# Patient Record
Sex: Male | Born: 1940
Health system: Southern US, Community
[De-identification: ages and names within clinical notes are randomized; demographics above are authoritative.]

## PROBLEM LIST (undated history)

## (undated) DIAGNOSIS — F419 Anxiety disorder, unspecified: Secondary | ICD-10-CM

## (undated) DIAGNOSIS — L570 Actinic keratosis: Secondary | ICD-10-CM

## (undated) DIAGNOSIS — K219 Gastro-esophageal reflux disease without esophagitis: Secondary | ICD-10-CM

## (undated) DIAGNOSIS — R42 Dizziness and giddiness: Secondary | ICD-10-CM

## (undated) HISTORY — DX: Gastro-esophageal reflux disease without esophagitis: K21.9

---
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
## 2006-08-09 ENCOUNTER — Ambulatory Visit: Payer: Self-pay | Admitting: General Surgery

## 2006-09-17 ENCOUNTER — Ambulatory Visit: Payer: Self-pay

## 2007-03-01 DIAGNOSIS — L720 Epidermal cyst: Secondary | ICD-10-CM | POA: Insufficient documentation

## 2007-03-01 DIAGNOSIS — N529 Male erectile dysfunction, unspecified: Secondary | ICD-10-CM | POA: Insufficient documentation

## 2007-06-17 DIAGNOSIS — J309 Allergic rhinitis, unspecified: Secondary | ICD-10-CM | POA: Insufficient documentation

## 2007-07-26 DIAGNOSIS — L570 Actinic keratosis: Secondary | ICD-10-CM

## 2007-07-26 HISTORY — DX: Actinic keratosis: L57.0

## 2008-10-29 DIAGNOSIS — N509 Disorder of male genital organs, unspecified: Secondary | ICD-10-CM | POA: Insufficient documentation

## 2009-02-09 HISTORY — PX: SHOULDER OPEN ROTATOR CUFF REPAIR: SHX2407

## 2009-04-11 DIAGNOSIS — L080 Pyoderma: Secondary | ICD-10-CM | POA: Insufficient documentation

## 2009-04-16 DIAGNOSIS — L98499 Non-pressure chronic ulcer of skin of other sites with unspecified severity: Secondary | ICD-10-CM | POA: Insufficient documentation

## 2009-09-06 DIAGNOSIS — I8390 Asymptomatic varicose veins of unspecified lower extremity: Secondary | ICD-10-CM | POA: Insufficient documentation

## 2009-11-10 ENCOUNTER — Ambulatory Visit: Payer: Self-pay | Admitting: Specialist

## 2009-12-20 ENCOUNTER — Ambulatory Visit: Payer: Self-pay | Admitting: Specialist

## 2010-01-01 ENCOUNTER — Ambulatory Visit: Payer: Self-pay | Admitting: Specialist

## 2010-06-11 ENCOUNTER — Ambulatory Visit: Payer: Self-pay | Admitting: Family Medicine

## 2010-10-05 ENCOUNTER — Ambulatory Visit: Payer: Self-pay | Admitting: Specialist

## 2010-11-04 ENCOUNTER — Observation Stay: Payer: Self-pay | Admitting: Internal Medicine

## 2012-01-23 ENCOUNTER — Ambulatory Visit: Payer: Self-pay | Admitting: Unknown Physician Specialty

## 2012-06-20 ENCOUNTER — Ambulatory Visit: Payer: Self-pay | Admitting: General Surgery

## 2012-06-28 ENCOUNTER — Encounter: Payer: Self-pay | Admitting: General Surgery

## 2012-06-28 ENCOUNTER — Ambulatory Visit (INDEPENDENT_AMBULATORY_CARE_PROVIDER_SITE_OTHER): Payer: Medicare Other | Admitting: General Surgery

## 2012-06-28 VITALS — BP 128/72 | HR 72 | Resp 14 | Ht 71.0 in | Wt 194.0 lb

## 2012-06-28 DIAGNOSIS — I83893 Varicose veins of bilateral lower extremities with other complications: Secondary | ICD-10-CM

## 2012-06-28 NOTE — Patient Instructions (Addendum)
The patient is aware to call back for any questions or concerns. Patient advised to wear compression hose on a daily basis. Patient to continue Ibuprofen for discomfort. Patient to return in 1 month.

## 2012-06-28 NOTE — Progress Notes (Signed)
Patient ID: Edward Rodriguez, male   DOB: 12-Jun-1940, 72 y.o.   MRN: [id number]  Chief Complaint  Patient presents with  . Other    evaluate legs    HPI Edward Rodriguez is a 72 y.o. male.  Patient here today for evaluation of right leg vein.  States he has had pain in that are on/off for about a year but 1-2 months it seems to be worse.  Sates it is "knotty and sensitive". Denies any previous vein surgery.  Recovering from bronchitis last month. He reports that in last 2 mos he has noted some prominent veins in right calf and lower thigh. No symptoms of leg fatigue,aching or swelling at day's end. HPI  Past Medical History  Diagnosis Date  . GERD (gastroesophageal reflux disease)     Past Surgical History  Procedure Laterality Date  . Joint replacement Right 10/2010    rotator cuff    History reviewed. No pertinent family history.  Social History History  Substance Use Topics  . Smoking status: Former Games developer  . Smokeless tobacco: Not on file  . Alcohol Use: Yes     Comment: occasionally    Allergies  Allergen Reactions  . Sulfur Rash    Current Outpatient Prescriptions  Medication Sig Dispense Refill  . ibuprofen (ADVIL,MOTRIN) 200 MG tablet Take 200 mg by mouth every 6 (six) hours as needed for pain.      Marland Kitchen omeprazole (PRILOSEC) 20 MG capsule Take 40 mg by mouth daily.      . ondansetron (ZOFRAN-ODT) 8 MG disintegrating tablet        No current facility-administered medications for this visit.    Review of Systems Review of Systems  Constitutional: Negative.   Respiratory: Negative.   Cardiovascular: Negative.     Blood pressure 128/72, pulse 72, resp. rate 14, height 5\' 11"  (1.803 m), weight 194 lb (87.998 kg).  Physical Exam Physical Exam  Constitutional: He appears well-developed and well-nourished.  Cardiovascular: Normal rate, regular rhythm and normal heart sounds.   Pulses:      Dorsalis pedis pulses are 2+ on the right side, and 2+ on the left side.   Posterior tibial pulses are 2+ on the right side, and 2+ on the left side.  No edema on the legs. Clusters of varicose veins in right medial calf and lower inner thigh. Left side shows no varicose veins. Scattered spider veins in both sides. The cluster in right inner thigh has a few palpable clots.     Data Reviewed None  Assessment    Varicose veins right lower extremity with focal superficial phlebitis lower inner thigh.     Plan    Use of ibuprofen which he is already doing and the use of compression hose for which a RX was given ankle pressure 20-30 mmhg.        SANKAR,SEEPLAPUTHUR G 06/29/2012, 5:17 PM

## 2012-06-29 ENCOUNTER — Encounter: Payer: Self-pay | Admitting: General Surgery

## 2012-06-29 DIAGNOSIS — I83893 Varicose veins of bilateral lower extremities with other complications: Secondary | ICD-10-CM | POA: Insufficient documentation

## 2012-08-01 ENCOUNTER — Ambulatory Visit: Payer: Medicare Other | Admitting: General Surgery

## 2012-08-30 ENCOUNTER — Encounter: Payer: Self-pay | Admitting: *Deleted

## 2013-01-04 ENCOUNTER — Ambulatory Visit: Payer: Medicare Other | Admitting: Podiatry

## 2013-01-04 ENCOUNTER — Encounter: Payer: Self-pay | Admitting: *Deleted

## 2013-01-09 ENCOUNTER — Ambulatory Visit: Payer: Medicare Other | Admitting: Podiatry

## 2013-01-25 ENCOUNTER — Ambulatory Visit (INDEPENDENT_AMBULATORY_CARE_PROVIDER_SITE_OTHER): Payer: Medicare Other | Admitting: Podiatry

## 2013-01-25 ENCOUNTER — Ambulatory Visit (INDEPENDENT_AMBULATORY_CARE_PROVIDER_SITE_OTHER): Payer: Medicare Other

## 2013-01-25 ENCOUNTER — Encounter: Payer: Self-pay | Admitting: Podiatry

## 2013-01-25 VITALS — BP 114/68 | HR 80 | Resp 16 | Ht 70.0 in | Wt 198.4 lb

## 2013-01-25 DIAGNOSIS — M7750 Other enthesopathy of unspecified foot: Secondary | ICD-10-CM

## 2013-01-25 DIAGNOSIS — M79609 Pain in unspecified limb: Secondary | ICD-10-CM

## 2013-01-25 DIAGNOSIS — M79672 Pain in left foot: Secondary | ICD-10-CM

## 2013-01-25 DIAGNOSIS — M7672 Peroneal tendinitis, left leg: Secondary | ICD-10-CM

## 2013-01-25 MED ORDER — METHYLPREDNISOLONE (PAK) 4 MG PO TABS: ORAL_TABLET | ORAL | Status: DC

## 2013-01-25 NOTE — Progress Notes (Signed)
Edward Rodriguez presents today as a 72 year old white male with pain to his left foot lateral aspect times past 6 months to one year. His and nothing to try to assist in relieving the pain.  Objective: Vital signs are stable he is alert and oriented x3. Pulses remain strongly palpable bilateral lower extremity. He has tenderness on abduction against resistance he also has tenderness on plantar flexion and eversion of his left foot. Tenderness on direct palpation of the fifth metatarsal base. Radiographs evaluation does not demonstrate any type of osseous abnormalities in this area however I am concerned of insertional peroneal tendinitis.  Assessment: Peroneal tendinitis left.  Plan: Injected at the point of maximum tenderness today 2 mg of dexamethasone and local anesthetic. Her prescription for Medrol Dosepak. And I will followup with him in one month.

## 2013-02-22 ENCOUNTER — Ambulatory Visit: Payer: Medicare Other | Admitting: Podiatry

## 2014-05-31 ENCOUNTER — Ambulatory Visit
Admit: 2014-05-31 | Disposition: A | Discharge status: Home / Self Care | Payer: Self-pay | Attending: Specialist | Admitting: Specialist

## 2014-06-10 DIAGNOSIS — R42 Dizziness and giddiness: Secondary | ICD-10-CM

## 2014-06-10 HISTORY — DX: Dizziness and giddiness: R42

## 2014-06-10 HISTORY — PX: ROTATOR CUFF REPAIR: SHX139

## 2014-06-19 ENCOUNTER — Encounter
Admission: RE | Admit: 2014-06-19 | Discharge: 2014-06-19 | Disposition: A | Discharge status: Home / Self Care | Payer: Commercial Managed Care - HMO | Source: Ambulatory Visit | Attending: Orthopedic Surgery | Admitting: Orthopedic Surgery

## 2014-06-19 DIAGNOSIS — F419 Anxiety disorder, unspecified: Secondary | ICD-10-CM | POA: Diagnosis not present

## 2014-06-19 DIAGNOSIS — R42 Dizziness and giddiness: Secondary | ICD-10-CM | POA: Diagnosis not present

## 2014-06-19 DIAGNOSIS — Z01810 Encounter for preprocedural cardiovascular examination: Secondary | ICD-10-CM | POA: Diagnosis not present

## 2014-06-19 DIAGNOSIS — Z01812 Encounter for preprocedural laboratory examination: Secondary | ICD-10-CM | POA: Diagnosis present

## 2014-06-19 DIAGNOSIS — I83899 Varicose veins of unspecified lower extremities with other complications: Secondary | ICD-10-CM | POA: Insufficient documentation

## 2014-06-19 DIAGNOSIS — K219 Gastro-esophageal reflux disease without esophagitis: Secondary | ICD-10-CM | POA: Insufficient documentation

## 2014-06-19 HISTORY — DX: Dizziness and giddiness: R42

## 2014-06-19 HISTORY — DX: Anxiety disorder, unspecified: F41.9

## 2014-06-19 LAB — BASIC METABOLIC PANEL
Anion gap: 5 (ref 5–15)
BUN: 16 mg/dL (ref 6–20)
CALCIUM: 9.5 mg/dL (ref 8.9–10.3)
CO2: 32 mmol/L (ref 22–32)
CREATININE: 1.09 mg/dL (ref 0.61–1.24)
Chloride: 105 mmol/L (ref 101–111)
GFR calc non Af Amer: >60 mL/min (ref >60)
GLUCOSE: 103 mg/dL — AB (ref 65–99)
Potassium: 4.2 mmol/L (ref 3.5–5.1)
Sodium: 142 mmol/L (ref 135–145)

## 2014-06-19 LAB — CBC
HCT: 45.5 % (ref 40.0–52.0)
Hemoglobin: 15.7 g/dL (ref 13.0–18.0)
MCH: 32.1 pg (ref 26.0–34.0)
MCHC: 34.4 g/dL (ref 32.0–36.0)
MCV: 93.3 fL (ref 80.0–100.0)
PLATELETS: 215 10*3/uL (ref 150–440)
RBC: 4.88 MIL/uL (ref 4.40–5.90)
RDW: 13.5 % (ref 11.5–14.5)
WBC: 7.7 10*3/uL (ref 3.8–10.6)

## 2014-06-19 LAB — APTT: aPTT: 28 seconds (ref 24–36)

## 2014-06-19 LAB — PROTIME-INR
INR: 0.89
PROTHROMBIN TIME: 12.3 s (ref 11.4–15.0)

## 2014-06-19 NOTE — Patient Instructions (Signed)
  Your procedure is scheduled on: Wednesday Jun 27, 2014 Report to Same Day Surgery. To find out your arrival time please call [phone] between 1PM - 3PM on Wednesday Jun 26, 2014.  Remember: Instructions that are not followed completely may result in serious medical risk, up to and including death, or upon the discretion of your surgeon and anesthesiologist your surgery may need to be rescheduled.    __x__ 1. Do not eat food or drink liquids after midnight. No gum chewing or hard candies.     __x__ 2. No Alcohol for 24 hours before or after surgery.   ____ 3. Bring all medications with you on the day of surgery if instructed.    _x_ 4. Notify your doctor if there is any change in your medical condition     (cold, fever, infections).     Do not wear jewelry, make-up, hairpins, clips or nail polish.  Do not wear lotions, powders, or perfumes. You may wear deodorant.  Do not shave 48 hours prior to surgery. Men may shave face and neck.  Do not bring valuables to the hospital.    [facility] is not responsible for any belongings or valuables.               Contacts, dentures or bridgework may not be worn into surgery.  Leave your suitcase in the car. After surgery it may be brought to your room.  For patients admitted to the hospital, discharge time is determined by your treatment team.   Patients discharged the day of surgery will not be allowed to drive home.   Please read over the following fact sheets that you were given:   [facility] Preparing for Surgery   ____ Take these medicines the morning of surgery with A SIP OF WATER:      1. Omeprazole (PRILOSEC)  2. venlafaxine XR (EFFEXOR-XR)  3. HYDROcodone-acetaminophen OPTIONAL  4. meclizine (ANTIVERT) optional if needed   ____ Fleet Enema (as directed)   __x__ Use CHG Soap as directed  ____ Use inhalers on the day of surgery  ____ Stop metformin 2 days prior to surgery    ____ Take 1/2 of usual insulin dose the  night before surgery and none on the morning of surgery.   ____ Stop Coumadin/Plavix/aspirin on does not apply.  __x__ Stop Anti-inflammatories (Ibuprofen)on already stopped.    ____ Stop supplements until after surgery.    ____ Bring C-Pap to the hospital.

## 2014-06-26 NOTE — H&P (Signed)
PREOPERATIVE H&P  Chief Complaint: FULL THICKNESS ROTATOR CUFF TEAR, LEFT SHOULDER  HPI: Edward Rodriguez is a 74 y.o. male who presents for preoperative history and physical with a diagnosis of FULL THICKNESS ROTATOR CUFF TEAR of his left shoulder. He has pain with abduction and external rotation activity.  His symptoms have persisted since last summer and have not improved despite non-operative management including NSAIDS, PT and corticosteroid injections.  An MRI has demonstrated a full thickness tear of the infraspinatus.   He has elected for surgical management of this injury given the failure of non-operative management..   Past Medical History  Diagnosis Date  . GERD (gastroesophageal reflux disease)   . Vertigo 06/2014    had an episode in 2013.  Marland Kitchen Anxiety    Past Surgical History  Procedure Laterality Date  . Shoulder open rotator cuff repair Right 2011   History   Social History  . Marital Status: Married    Spouse Name: N/A  . Number of Children: N/A  . Years of Education: N/A   Social History Main Topics  . Smoking status: Former Smoker    Quit date: 06/18/1968  . Smokeless tobacco: Never Used  . Alcohol Use: 2.4 - 3.0 oz/week    4 Cans of beer, 0-1 Shots of liquor per week     Comment: occasionally  . Drug Use: No  . Sexual Activity: Not on file   Other Topics Concern  . Not on file   Social History Narrative   No family history on file. Allergies  Allergen Reactions  . Sulfur Rash   Prior to Admission medications   Medication Sig Start Date End Date Taking? Authorizing Provider  fluticasone (FLONASE) 50 MCG/ACT nasal spray Place 2 sprays into the nose every morning.  04/05/13   Historical Provider, MD  HYDROcodone-acetaminophen (NORCO/VICODIN) 5-325 MG per tablet Take 1 tablet by mouth every 6 (six) hours as needed for moderate pain.    Historical Provider, MD  Hydrocortisone Ace-Pramoxine 2.5-1 % LOTN Place 1 Tube rectally 3 (three) times daily as needed  (hemorrhoids). Apply 2-3 times a day 07/13/13   Historical Provider, MD  ibuprofen (ADVIL,MOTRIN) 200 MG tablet Take 200 mg by mouth every 6 (six) hours as needed for pain.    Historical Provider, MD  meclizine (ANTIVERT) 25 MG tablet Take 25 mg by mouth 3 (three) times daily as needed for dizziness.    Historical Provider, MD  methylPREDNIsolone (MEDROL DOSPACK) 4 MG tablet follow package directions 01/25/13   Max T Hyatt, DPM  omeprazole (PRILOSEC) 20 MG capsule Take 40 mg by mouth every morning.     Historical Provider, MD  venlafaxine XR (EFFEXOR-XR) 150 MG 24 hr capsule Take 150 mg by mouth every morning.    Historical Provider, MD     Positive ROS: All other systems have been reviewed and were otherwise negative with the exception of those mentioned in the HPI and as above.  Physical Exam: General: Alert, no acute distress HEENT:  PERRLA, EOMI, No oral lesions, 2+ carotid pulses and no cervical lymphadenopathy Cardiac:  RRR, No M/R/G Respiratory: CTA bilaterally, No wheezing GI: No organomegaly, abdomen is soft and non-tender, non distended, + bowel sounds Skin: No lesions in the area of chief complaint Neurologic: Sensation intact distally Psychiatric: Patient is competent for consent with normal mood and affect   MUSCULOSKELETAL: Left shoulder:  Patient has 160 forward elevation and abduction.  He can ER to 60 degrees but has significant weakness of his  external rotators.  He has mild pain with resisted abduction.  Skin is intact.  He has intact sensation to light touch and a palpable radial pulse.  He has no instability or apprehension.  He has tenderness over the greater tuberosity.  Assessment: FULL THICKNESS ROTATOR CUFF TEAR, LEFT SHOULDER  Plan: Plan for Procedure(s): ARTHROSCOPY SHOULDER with mini open rotator cuff repair of the left shoulder  I reviewed the procedure in detail with the patient as well as the post-operative course including use of a sling for 4 weeks and  the need for post-op physical therapy.  The risks benefits and alternatives were discussed with the patient.  The risks include but are not not limited to infection, bleeding, nerve or blood vessel  injury, shoulder stiffness, persistent pain or weakness, hardware failure, retear of the rotator cuff and the need for further surgery.  Medical risks include but are not limited to: DVT/PE,  MI, stroke, pneumonia, respiratory failure and death.   He understood these risks and wished to proceed. He has been cleared for surgery by his PCP, Dr. Lelon Huh.  Thornton Park, MD   06/26/2014 9:15 PM

## 2014-06-27 ENCOUNTER — Ambulatory Visit: Payer: Commercial Managed Care - HMO | Admitting: Anesthesiology

## 2014-06-27 ENCOUNTER — Observation Stay
Admission: RE | Admit: 2014-06-27 | Discharge: 2014-06-28 | Disposition: A | Discharge status: Home / Self Care | Payer: Commercial Managed Care - HMO | Source: Ambulatory Visit | Attending: Orthopedic Surgery | Admitting: Orthopedic Surgery

## 2014-06-27 ENCOUNTER — Encounter
Admission: RE | Disposition: A | Discharge status: Home / Self Care | Payer: Self-pay | Source: Ambulatory Visit | Attending: Orthopedic Surgery

## 2014-06-27 ENCOUNTER — Encounter: Payer: Self-pay | Admitting: Anesthesiology

## 2014-06-27 DIAGNOSIS — Z7951 Long term (current) use of inhaled steroids: Secondary | ICD-10-CM | POA: Diagnosis not present

## 2014-06-27 DIAGNOSIS — M75102 Unspecified rotator cuff tear or rupture of left shoulder, not specified as traumatic: Secondary | ICD-10-CM | POA: Diagnosis present

## 2014-06-27 DIAGNOSIS — Z7952 Long term (current) use of systemic steroids: Secondary | ICD-10-CM | POA: Diagnosis not present

## 2014-06-27 DIAGNOSIS — M25512 Pain in left shoulder: Secondary | ICD-10-CM

## 2014-06-27 DIAGNOSIS — S43422A Sprain of left rotator cuff capsule, initial encounter: Secondary | ICD-10-CM

## 2014-06-27 DIAGNOSIS — Z79891 Long term (current) use of opiate analgesic: Secondary | ICD-10-CM | POA: Insufficient documentation

## 2014-06-27 DIAGNOSIS — Z882 Allergy status to sulfonamides status: Secondary | ICD-10-CM | POA: Diagnosis not present

## 2014-06-27 DIAGNOSIS — Z791 Long term (current) use of non-steroidal anti-inflammatories (NSAID): Secondary | ICD-10-CM | POA: Diagnosis not present

## 2014-06-27 DIAGNOSIS — F419 Anxiety disorder, unspecified: Secondary | ICD-10-CM | POA: Diagnosis not present

## 2014-06-27 DIAGNOSIS — K219 Gastro-esophageal reflux disease without esophagitis: Secondary | ICD-10-CM | POA: Insufficient documentation

## 2014-06-27 DIAGNOSIS — Z79899 Other long term (current) drug therapy: Secondary | ICD-10-CM | POA: Diagnosis not present

## 2014-06-27 DIAGNOSIS — Z87891 Personal history of nicotine dependence: Secondary | ICD-10-CM | POA: Diagnosis not present

## 2014-06-27 DIAGNOSIS — M75112 Incomplete rotator cuff tear or rupture of left shoulder, not specified as traumatic: Secondary | ICD-10-CM | POA: Diagnosis not present

## 2014-06-27 DIAGNOSIS — Z9889 Other specified postprocedural states: Secondary | ICD-10-CM

## 2014-06-27 DIAGNOSIS — M25519 Pain in unspecified shoulder: Secondary | ICD-10-CM

## 2014-06-27 HISTORY — PX: SHOULDER ARTHROSCOPY: SHX128

## 2014-06-27 SURGERY — ARTHROSCOPY, SHOULDER
Anesthesia: General | Laterality: Left | Wound class: Clean

## 2014-06-27 MED ORDER — LACTATED RINGERS IV SOLN
INTRAVENOUS | Status: DC | PRN
  Administered 2014-06-27: 10 mL

## 2014-06-27 MED ORDER — BUPIVACAINE HCL 0.25 % IJ SOLN
INTRAMUSCULAR | Status: DC | PRN
  Administered 2014-06-27: 30 mL

## 2014-06-27 MED ORDER — PHENOL 1.4 % MT LIQD: 1.0000 | OROMUCOSAL | Status: DC | PRN

## 2014-06-27 MED ORDER — ONDANSETRON HCL 4 MG/2ML IJ SOLN: 4.0000 mg | Freq: Four times a day (QID) | INTRAMUSCULAR | Status: DC | PRN

## 2014-06-27 MED ORDER — LIDOCAINE HCL 1 % IJ SOLN
INTRAMUSCULAR | Status: DC | PRN
  Administered 2014-06-27: 12 mL via INTRADERMAL

## 2014-06-27 MED ORDER — MIDAZOLAM HCL 2 MG/2ML IJ SOLN
1.0000 mg | Freq: Once | INTRAMUSCULAR | Status: AC
  Administered 2014-06-27: 1 mg via INTRAVENOUS

## 2014-06-27 MED ORDER — MENTHOL 3 MG MT LOZG
1.0000 | LOZENGE | OROMUCOSAL | Status: DC | PRN
  Filled 2014-06-27: qty 9

## 2014-06-27 MED ORDER — BISACODYL 5 MG PO TBEC: 5.0000 mg | DELAYED_RELEASE_TABLET | Freq: Every day | ORAL | Status: DC | PRN

## 2014-06-27 MED ORDER — MENTHOL 3 MG MT LOZG: 1.0000 | LOZENGE | OROMUCOSAL | Status: DC | PRN

## 2014-06-27 MED ORDER — HYDROMORPHONE HCL 1 MG/ML IJ SOLN: 1.0000 mg | INTRAMUSCULAR | Status: DC | PRN

## 2014-06-27 MED ORDER — MIDAZOLAM HCL 5 MG/5ML IJ SOLN
INTRAMUSCULAR | Status: DC | PRN
  Administered 2014-06-27 (×2): 1 mg via INTRAVENOUS

## 2014-06-27 MED ORDER — PROPOFOL 10 MG/ML IV BOLUS
INTRAVENOUS | Status: DC | PRN
  Administered 2014-06-27: 20 mg via INTRAVENOUS
  Administered 2014-06-27: 150 mg via INTRAVENOUS

## 2014-06-27 MED ORDER — LACTATED RINGERS IV SOLN
INTRAVENOUS | Status: DC
  Administered 2014-06-27 (×3): via INTRAVENOUS

## 2014-06-27 MED ORDER — MAGNESIUM HYDROXIDE 400 MG/5ML PO SUSP: 30.0000 mL | Freq: Every day | ORAL | Status: DC | PRN

## 2014-06-27 MED ORDER — MAGNESIUM CITRATE PO SOLN
1.0000 | Freq: Once | ORAL | Status: AC | PRN
  Filled 2014-06-27: qty 296

## 2014-06-27 MED ORDER — ACETAMINOPHEN 325 MG PO TABS: 650.0000 mg | ORAL_TABLET | Freq: Four times a day (QID) | ORAL | Status: DC | PRN

## 2014-06-27 MED ORDER — LIDOCAINE HCL (CARDIAC) 10 MG/ML IV SOLN
INTRAVENOUS | Status: DC | PRN
  Administered 2014-06-27: 40 mg via INTRAVENOUS

## 2014-06-27 MED ORDER — ALUM & MAG HYDROXIDE-SIMETH 200-200-20 MG/5ML PO SUSP: 30.0000 mL | ORAL | Status: DC | PRN

## 2014-06-27 MED ORDER — FENTANYL CITRATE (PF) 100 MCG/2ML IJ SOLN
50.0000 ug | Freq: Once | INTRAMUSCULAR | Status: AC
  Administered 2014-06-27: 50 ug via INTRAVENOUS

## 2014-06-27 MED ORDER — ONDANSETRON HCL 4 MG PO TABS
4.0000 mg | ORAL_TABLET | Freq: Four times a day (QID) | ORAL | Status: DC | PRN
  Filled 2014-06-27: qty 1

## 2014-06-27 MED ORDER — OXYCODONE HCL 5 MG PO TABS
5.0000 mg | ORAL_TABLET | ORAL | Status: DC | PRN
  Administered 2014-06-27 (×2): 5 mg via ORAL
  Administered 2014-06-28 (×3): 10 mg via ORAL
  Filled 2014-06-27: qty 1
  Filled 2014-06-27: qty 2
  Filled 2014-06-27: qty 1
  Filled 2014-06-27 (×2): qty 2

## 2014-06-27 MED ORDER — OXYCODONE HCL 5 MG PO TABS: 5.0000 mg | ORAL_TABLET | ORAL | Status: DC | PRN

## 2014-06-27 MED ORDER — CELECOXIB 200 MG PO CAPS
200.0000 mg | ORAL_CAPSULE | Freq: Two times a day (BID) | ORAL | Status: DC
  Administered 2014-06-27 – 2014-06-28 (×2): 200 mg via ORAL
  Filled 2014-06-27 (×2): qty 1

## 2014-06-27 MED ORDER — SODIUM CHLORIDE 0.9 % IV SOLN: INTRAVENOUS | Status: DC

## 2014-06-27 MED ORDER — NEOMYCIN-POLYMYXIN B GU 40-200000 IR SOLN
Status: DC | PRN
  Administered 2014-06-27: 2 mL

## 2014-06-27 MED ORDER — DOCUSATE SODIUM 100 MG PO CAPS
100.0000 mg | ORAL_CAPSULE | Freq: Two times a day (BID) | ORAL | Status: DC
  Administered 2014-06-27 – 2014-06-28 (×2): 100 mg via ORAL
  Filled 2014-06-27 (×2): qty 1

## 2014-06-27 MED ORDER — DIPHENHYDRAMINE HCL 12.5 MG/5ML PO ELIX
12.5000 mg | ORAL_SOLUTION | ORAL | Status: DC | PRN
  Filled 2014-06-27: qty 10

## 2014-06-27 MED ORDER — NEOMYCIN-POLYMYXIN B GU 40-200000 IR SOLN
Status: AC
  Filled 2014-06-27: qty 2

## 2014-06-27 MED ORDER — CEFAZOLIN SODIUM-DEXTROSE 2-3 GM-% IV SOLR: 2.0000 g | Freq: Once | INTRAVENOUS | Status: DC

## 2014-06-27 MED ORDER — DIPHENHYDRAMINE HCL 12.5 MG/5ML PO ELIX
12.5000 mg | ORAL_SOLUTION | ORAL | Status: DC | PRN
  Administered 2014-06-27: 25 mg via ORAL
  Filled 2014-06-27: qty 10

## 2014-06-27 MED ORDER — GLYCOPYRROLATE 0.2 MG/ML IJ SOLN
INTRAMUSCULAR | Status: DC | PRN
  Administered 2014-06-27: 0.6 mg via INTRAVENOUS

## 2014-06-27 MED ORDER — FLUTICASONE PROPIONATE 50 MCG/ACT NA SUSP
2.0000 | Freq: Every day | NASAL | Status: DC
Start: 2014-06-27 — End: 2014-06-28
  Administered 2014-06-27 – 2014-06-28 (×2): 2 via NASAL
  Filled 2014-06-27: qty 16

## 2014-06-27 MED ORDER — PHENOL 1.4 % MT LIQD
1.0000 | OROMUCOSAL | Status: DC | PRN
  Filled 2014-06-27: qty 177

## 2014-06-27 MED ORDER — CEFAZOLIN SODIUM-DEXTROSE 2-3 GM-% IV SOLR
INTRAVENOUS | Status: AC
  Administered 2014-06-27: 2 g via INTRAVENOUS
  Filled 2014-06-27: qty 50

## 2014-06-27 MED ORDER — DOCUSATE SODIUM 100 MG PO CAPS: 100.0000 mg | ORAL_CAPSULE | Freq: Two times a day (BID) | ORAL | Status: DC

## 2014-06-27 MED ORDER — ACETAMINOPHEN 650 MG RE SUPP: 650.0000 mg | Freq: Four times a day (QID) | RECTAL | Status: DC | PRN

## 2014-06-27 MED ORDER — ONDANSETRON HCL 4 MG PO TABS: 4.0000 mg | ORAL_TABLET | Freq: Four times a day (QID) | ORAL | Status: DC | PRN

## 2014-06-27 MED ORDER — VENLAFAXINE HCL ER 75 MG PO CP24
150.0000 mg | ORAL_CAPSULE | Freq: Every day | ORAL | Status: DC
  Administered 2014-06-27 – 2014-06-28 (×2): 150 mg via ORAL
  Filled 2014-06-27 (×2): qty 2

## 2014-06-27 MED ORDER — MIDAZOLAM HCL 5 MG/5ML IJ SOLN
INTRAMUSCULAR | Status: AC
  Administered 2014-06-27: 1 mg via INTRAVENOUS
  Filled 2014-06-27: qty 5

## 2014-06-27 MED ORDER — SUCCINYLCHOLINE CHLORIDE 20 MG/ML IJ SOLN
INTRAMUSCULAR | Status: DC | PRN
  Administered 2014-06-27: 120 mg via INTRAVENOUS

## 2014-06-27 MED ORDER — NEOSTIGMINE METHYLSULFATE 10 MG/10ML IV SOLN
INTRAVENOUS | Status: DC | PRN
Start: 2014-06-27 — End: 2014-06-27
  Administered 2014-06-27: 3 mg via INTRAVENOUS

## 2014-06-27 MED ORDER — FENTANYL CITRATE (PF) 100 MCG/2ML IJ SOLN: 25.0000 ug | INTRAMUSCULAR | Status: DC | PRN

## 2014-06-27 MED ORDER — DEXAMETHASONE SODIUM PHOSPHATE 4 MG/ML IJ SOLN: INTRAMUSCULAR | Status: DC | PRN

## 2014-06-27 MED ORDER — ONDANSETRON HCL 4 MG/2ML IJ SOLN
INTRAMUSCULAR | Status: DC | PRN
  Administered 2014-06-27: 4 mg via INTRAVENOUS

## 2014-06-27 MED ORDER — BUPIVACAINE HCL (PF) 0.25 % IJ SOLN
INTRAMUSCULAR | Status: AC
  Filled 2014-06-27: qty 30

## 2014-06-27 MED ORDER — FENTANYL CITRATE (PF) 100 MCG/2ML IJ SOLN
INTRAMUSCULAR | Status: AC
  Administered 2014-06-27: 50 ug via INTRAVENOUS
  Filled 2014-06-27: qty 2

## 2014-06-27 MED ORDER — EPHEDRINE SULFATE 50 MG/ML IJ SOLN
INTRAMUSCULAR | Status: DC | PRN
  Administered 2014-06-27: 10 mg via INTRAVENOUS

## 2014-06-27 MED ORDER — MECLIZINE HCL 12.5 MG PO TABS
25.0000 mg | ORAL_TABLET | Freq: Three times a day (TID) | ORAL | Status: DC | PRN
Start: 2014-06-27 — End: 2014-06-28

## 2014-06-27 MED ORDER — FENTANYL CITRATE (PF) 100 MCG/2ML IJ SOLN
INTRAMUSCULAR | Status: DC | PRN
  Administered 2014-06-27 (×2): 50 ug via INTRAVENOUS

## 2014-06-27 MED ORDER — ONDANSETRON HCL 4 MG/2ML IJ SOLN: 4.0000 mg | Freq: Once | INTRAMUSCULAR | Status: DC | PRN

## 2014-06-27 MED ORDER — SODIUM CHLORIDE 0.9 % IV SOLN
INTRAVENOUS | Status: DC
  Administered 2014-06-27: 17:00:00 via INTRAVENOUS

## 2014-06-27 MED ORDER — PHENYLEPHRINE HCL 10 MG/ML IJ SOLN
INTRAMUSCULAR | Status: DC | PRN
  Administered 2014-06-27 (×5): 100 ug via INTRAVENOUS
  Administered 2014-06-27: 150 ug via INTRAVENOUS

## 2014-06-27 MED ORDER — MAGNESIUM CITRATE PO SOLN: 1.0000 | Freq: Once | ORAL | Status: DC | PRN

## 2014-06-27 MED ORDER — CELECOXIB 200 MG PO CAPS: 200.0000 mg | ORAL_CAPSULE | Freq: Two times a day (BID) | ORAL | Status: DC

## 2014-06-27 MED ORDER — CEFAZOLIN SODIUM 1-5 GM-% IV SOLN: 1.0000 g | Freq: Four times a day (QID) | INTRAVENOUS | Status: DC

## 2014-06-27 MED ORDER — CEFAZOLIN SODIUM 1-5 GM-% IV SOLN
1.0000 g | Freq: Four times a day (QID) | INTRAVENOUS | Status: AC
Start: 2014-06-27 — End: 2014-06-28
  Administered 2014-06-28 (×2): 1 g via INTRAVENOUS
  Filled 2014-06-27 (×4): qty 50

## 2014-06-27 MED ORDER — ROCURONIUM BROMIDE 100 MG/10ML IV SOLN
INTRAVENOUS | Status: DC | PRN
  Administered 2014-06-27: 10 mg via INTRAVENOUS
  Administered 2014-06-27: 20 mg via INTRAVENOUS

## 2014-06-27 MED ORDER — LIDOCAINE HCL (PF) 1 % IJ SOLN
INTRAMUSCULAR | Status: AC
  Filled 2014-06-27: qty 30

## 2014-06-27 MED ORDER — EPINEPHRINE HCL 1 MG/ML IJ SOLN
INTRAMUSCULAR | Status: AC
Start: 2014-06-27 — End: 2014-06-27
  Filled 2014-06-27: qty 1

## 2014-06-27 SURGICAL SUPPLY — 68 items
ADAPTER IRRIG TUBE 2 SPIKE SOL (ADAPTER) ×6 IMPLANT
ANCHOR DBLROW ROT CUFF 2.8 MAG (Anchor) ×3 IMPLANT
BUR RADIUS 4.0X18.5 (BURR) ×3 IMPLANT
BUR RADIUS 5.5 (BURR) ×3 IMPLANT
CANNULA 5.75X7 CRYSTAL CLEAR (CANNULA) IMPLANT
CANNULA PARTIAL THREAD 2X7 (CANNULA) IMPLANT
CANNULA TWIST IN 8.25X9CM (CANNULA) IMPLANT
CLOSURE WOUND 1/2 X4 (GAUZE/BANDAGES/DRESSINGS) ×1
CONNECTOR M SMARTSTITCH (Connector) ×3 IMPLANT
COOLER POLAR GLACIER W/PUMP (MISCELLANEOUS) ×3 IMPLANT
DRAPE IMP U-DRAPE 54X76 (DRAPES) ×6 IMPLANT
DRAPE INCISE IOBAN 66X45 STRL (DRAPES) ×3 IMPLANT
DRAPE U-SHAPE 47X51 STRL (DRAPES) ×3 IMPLANT
DURAPREP 26ML APPLICATOR (WOUND CARE) ×9 IMPLANT
GAUZE PETRO XEROFOAM 1X8 (MISCELLANEOUS) ×3 IMPLANT
GAUZE SPONGE 4X4 12PLY STRL (GAUZE/BANDAGES/DRESSINGS) ×6 IMPLANT
GAUZE XEROFORM 4X4 STRL (GAUZE/BANDAGES/DRESSINGS) ×3 IMPLANT
GLOVE BIO SURGEON STRL SZ8 (GLOVE) ×3 IMPLANT
GLOVE BIOGEL PI IND STRL 9 (GLOVE) ×2 IMPLANT
GLOVE BIOGEL PI INDICATOR 9 (GLOVE) ×4
GLOVE SURG 9.0 ORTHO LTXF (GLOVE) ×9 IMPLANT
GOWN STRL REUS TWL 2XL XL LVL4 (GOWN DISPOSABLE) ×3 IMPLANT
GOWN STRL REUS W/ TWL LRG LVL3 (GOWN DISPOSABLE) ×1 IMPLANT
GOWN STRL REUS W/ TWL LRG LVL4 (GOWN DISPOSABLE) ×1 IMPLANT
GOWN STRL REUS W/TWL LRG LVL3 (GOWN DISPOSABLE) ×2
GOWN STRL REUS W/TWL LRG LVL4 (GOWN DISPOSABLE) ×2
IV LACTATED RINGER IRRG 3000ML (IV SOLUTION) ×20
IV LR IRRIG 3000ML ARTHROMATIC (IV SOLUTION) ×10 IMPLANT
KIT RM TURNOVER STRD PROC AR (KITS) ×3 IMPLANT
KIT STABILIZATION SHOULDER (MISCELLANEOUS) ×3 IMPLANT
MANIFOLD NEPTUNE II (INSTRUMENTS) ×3 IMPLANT
MASK FACE SPIDER DISP (MASK) ×3 IMPLANT
MAT BLUE FLOOR 46X72 FLO (MISCELLANEOUS) ×3 IMPLANT
NDL MAYO CATGUT SZ5 (NEEDLE) ×2
NDL SAFETY 18GX1.5 (NEEDLE) ×3 IMPLANT
NDL SAFETY 22GX1.5 (NEEDLE) ×3 IMPLANT
NDL SUT 5 .5 CRC TPR PNT MAYO (NEEDLE) ×1 IMPLANT
NS IRRIG 500ML POUR BTL (IV SOLUTION) ×3 IMPLANT
PACK ARTHROSCOPY SHOULDER (MISCELLANEOUS) ×3 IMPLANT
PAD GROUND ADULT SPLIT (MISCELLANEOUS) ×3 IMPLANT
PAD WRAPON POLAR SHDR UNIV (MISCELLANEOUS) ×1 IMPLANT
PASSER SUT CAPTURE FIRST (SUTURE) ×3 IMPLANT
SET TUBE SUCT SHAVER OUTFL 24K (TUBING) ×3 IMPLANT
SET TUBE TIP INTRA-ARTICULAR (MISCELLANEOUS) ×3 IMPLANT
SLING ULTRA II LG (MISCELLANEOUS) ×3 IMPLANT
SLING ULTRA II M (MISCELLANEOUS) IMPLANT
STRIP CLOSURE SKIN 1/2X4 (GAUZE/BANDAGES/DRESSINGS) ×2 IMPLANT
SUT CO BRAID (SUTURE) ×9 IMPLANT
SUT ETHILON 4-0 (SUTURE) ×2
SUT ETHILON 4-0 FS2 18XMFL BLK (SUTURE) ×1
SUT KNTLS 2.8 MAGNUM (Anchor) ×12 IMPLANT
SUT MNCRL 4-0 (SUTURE) ×2
SUT MNCRL 4-0 27XMFL (SUTURE) ×1
SUT PDS AB 0 CT1 27 (SUTURE) ×3 IMPLANT
SUT VIC AB 0 CT1 36 (SUTURE) IMPLANT
SUT VIC AB 2-0 CT2 27 (SUTURE) ×3 IMPLANT
SUTURE ETHLN 4-0 FS2 18XMF BLK (SUTURE) ×1 IMPLANT
SUTURE MAGNUM WIRE 2X48 BLK (SUTURE) IMPLANT
SUTURE MNCRL 4-0 27XMF (SUTURE) ×1 IMPLANT
SUTURE OPUS MAGNUM SZ 2 WHT (SUTURE) ×9 IMPLANT
SYRINGE 10CC LL (SYRINGE) ×3 IMPLANT
TAPE MICROFOAM 4IN (TAPE) ×3 IMPLANT
TENS ELECTRODES ×6 IMPLANT
TUBING ARTHRO INFLOW-ONLY STRL (TUBING) ×3 IMPLANT
TUBING CONNECTING 10 (TUBING) ×2 IMPLANT
TUBING CONNECTING 10' (TUBING) ×1
WAND HAND CNTRL MULTIVAC 90 (MISCELLANEOUS) ×3 IMPLANT
WRAPON POLAR PAD SHDR UNIV (MISCELLANEOUS) ×3

## 2014-06-27 NOTE — H&P (Signed)
The patient has been re-examined, and the chart reviewed, and there have been no interval changes to the documented history and physical.    The risks, benefits, and alternatives have been discussed at length, and the patient is willing to proceed.     Timoteo Gaul, MD

## 2014-06-27 NOTE — Transfer of Care (Signed)
Immediate Anesthesia Transfer of Care Note  Patient: Edward Rodriguez  Procedure(s) Performed: Procedure(s): ARTHROSCOPY SHOULDER- mini open rotator cuff repair (Left)  Patient Location: PACU  Anesthesia Type:Regional  Level of Consciousness: awake and patient cooperative  Airway & Oxygen Therapy: Patient connected to face mask oxygen  Post-op Assessment: Report given to RN and Post -op Vital signs reviewed and stable  Post vital signs: stable  Last Vitals:  Filed Vitals:   06/27/14 1449  BP: 114/71  Pulse: 90  Temp: 37.6 C  Resp: 13    Complications: No apparent anesthesia complications

## 2014-06-27 NOTE — Progress Notes (Signed)
  Subjective:  Postoperative check. Patient is status post left mini open rotator cuff repair today. Patient reports pain as mild.  He has no complaints. Patient has a friend visiting at the bedside.  Objective:   VITALS:   Filed Vitals:   06/27/14 1605 06/27/14 1624 06/27/14 1650 06/27/14 1744  BP:  121/71 114/67 119/69  Pulse: 86 95 85 89  Temp: 98.4 F (36.9 C) 98.4 F (36.9 C) 98.4 F (36.9 C)   TempSrc:  Oral Oral   Resp: 19 18  18   SpO2: 93% 98% 96% 98%   Left shoulder: Polar Care in place, TENS unit in place, dressing clean dry and intact. Neurovascular intact Sensation intact distally. The patient can flex and extend the fingers on his left hand.  LABS  No results found for this or any previous visit (from the past 24 hour(s)).  No results found.  Assessment/Plan: Day of Surgery   Active Problems:   Pain in joint, shoulder region   S/P rotator cuff repair   Patient is doing well postop. I recommended he stay overnight for pain control and neurovascular monitoring. I have ordered 24 hours of postop antibiotics. I will reassess him in the morning. Patient understood and agreed with this plan.   Thornton Park , MD 06/27/2014, 5:45 PM

## 2014-06-27 NOTE — Progress Notes (Signed)
Applied ted hose.

## 2014-06-27 NOTE — Anesthesia Procedure Notes (Addendum)
Anesthesia Regional Block:  Interscalene brachial plexus block  Pre-Anesthetic Checklist: ,, timeout performed, Correct Patient, Correct Site, Correct Laterality, Correct Procedure, Correct Position, site marked, Risks and benefits discussed,  Surgical consent,  Pre-op evaluation,  At surgeon's request and post-op pain management   Prep: Betadine       Needles:  Injection technique: Single-shot  Needle Type: Echogenic Stimulator Needle     Needle Length: 5cm 5 cm Needle Gauge: 21 and 21 G    Additional Needles:  Procedures: ultrasound guided (picture in chart) and nerve stimulator Interscalene brachial plexus block  Nerve Stimulator or Paresthesia:  Response: biceps flexion, 0.8 mA,   Additional Responses:   Narrative:  Injection made incrementally with aspirations every 5 mL.  Performed by: Personally  Anesthesiologist: Gunnar Bulla  Additional Notes: Functioning IV was confirmed and monitors were applied.  A 31mm 22ga Arrow echogenic stimulator needle was used. Sterile prep and drape,hand hygiene and sterile gloves were used.  Negative aspiration and negative test dose prior to incremental administration of local anesthetic. The patient tolerated the procedure well.  Ultrasound guidance: relevent anatomy identified, needle position confirmed, local anesthetic spread visualized around nerve(s), vascular puncture avoided.  Image printed for medical record.  I used 55ml of .25 marcaine and .1 mg of epi.   Procedure Name: Intubation Date/Time: 06/27/2014 11:37 AM Performed by: Dionne Bucy Patient Re-evaluated:Patient Re-evaluated prior to inductionOxygen Delivery Method: Circle system utilized Preoxygenation: Pre-oxygenation with 100% oxygen Intubation Type: IV induction Ventilation: Mask ventilation without difficulty Laryngoscope Size: McGraph and 4 Grade View: Grade III Tube type: Oral Tube size: 7.5 mm Number of attempts: 2 Airway Equipment and Method:  Stylet Placement Confirmation: positive ETCO2 and breath sounds checked- equal and bilateral Secured at: 22 cm Tube secured with: Tape Dental Injury: Teeth and Oropharynx as per pre-operative assessment  Difficulty Due To: Difficulty was anticipated, Difficult Airway- due to reduced neck mobility, Difficult Airway- due to anterior larynx, Difficult Airway- due to limited oral opening and Difficult Airway- due to dentition Comments: Intubated per Dr. Marcello Moores using Meredith Staggers

## 2014-06-27 NOTE — Op Note (Signed)
06/27/2014  3:25 PM  PATIENT:  Edward Rodriguez    PRE-OPERATIVE DIAGNOSIS:  FULL THICKNESS ROTATOR CUFF TEAR  POST-OPERATIVE DIAGNOSIS:  High grade partial-thickness tear, infraspinatus tendon, left shoulder  PROCEDURE:  Left shoulder ARTHROSCOPY with mini open rotator cuff repair  SURGEON:  Thornton Park, MD  ANESTHESIA:   General  PREOPERATIVE INDICATIONS:  Edward Rodriguez is a  74 y.o. male with a diagnosis of left shoulder ROTATOR CUFF TEAR who failed conservative management including prescription NSAIDs, physical therapy and corticosteroid injection and elected for surgical management.    The risks benefits and alternatives were discussed with the patient preoperatively including but not limited to the risks of infection, bleeding, nerve injury, persistent pain or weakness, failure of the hardware, re-tear of the rotator cuff and the need for further surgery. Medical risks include DVT and pulmonary embolism, myocardial infarction, stroke, pneumonia, respiratory failure and death. Patient understood these risks and wished to proceed.  OPERATIVE IMPLANTS: ArthroCare Magnum M anchor 1 and Magnum 2 anchors 4  OPERATIVE FINDINGS: High-grade partial-thickness tear involving the infraspinatus  OPERATIVE PROCEDURE: The patient was met in the preoperative area. The operative shoulder was signed with the word yes and my initials according the hospital's correct site of surgery protocol. The patient underwent placement of an interscalene block by the anesthesia service.  Patient was brought to the operating room where the patient underwent general endotracheal intubation. They were placed in a beachchair position.  A spider arm positioner was used for this case. Examination under anesthesia revealed no limitation of motion and no instability with load shift testing. The patient had a negative sulcus sign.  Patient was prepped and draped in a sterile fashion. A timeout was performed to verify the  patient's name, date of birth, medical record number, correct site of surgery and correct procedure to be performed there was also used to verify the patient received antibiotics that all appropriate instruments, implants and radiographs studies were available in the room. Once all in attendance were in agreement case began.  Bony landmarks were drawn with a surgical marker along with proposed arthroscopy incisions. These were pre-injected with 1% lidocaine plain. An 11 blade was used to establish a posterior portal through which the arthroscope was placed in the glenohumeral joint. A full diagnostic examination of the shoulder was performed.    Findings at arthroscopy: A high-grade partial-thickness tear of the infraspinatus tendon. There was mild chondral wear of the glenohumeral joint surfaces. There was fraying of the superior anterior and posterior labrum without labral detachment. The biceps tendon was intact as was the subscapularis. The supraspinatus was also uninjured.  A 0 PDS was used to mark the infraspinatus tear. The arthroscope was then placed in the subacromial space. Extensive bursitis was encountered and debrided using a 40 resector shaver blade and the 90 ArthroCare wand from a lateral portal which was established under direct visualization using an 18-gauge spinal needle. A subacromial decompression was also performed using a 5.5 mm resector shaver blade from the lateral portal. A 40 resector shaver blade was then used to complete the high-grade partial tear of the infraspinatus after it was identified with the PDS suture.  A Smart stitch was placed in the lateral border of the rotator cuff tear. All arthroscopic incisions were then removed  A saber-type incision was made along the lateral border of the acromion. The deltoid muscle was identified and split in line with its fibers which allowed visualization of the rotator cuff. The Smart  stitch was brought out through the deltoid split.  Degenerative tissue the rotator cuff was carefully debrided using a 15 blade. 2 additional Smart stitches were placed in the lateral border of the rotator cuff. A 5.5 mm resector shaver blade was then used to bur the greater tuberosity removing all torn fibers of the rotator cuff. A single Magnum M anchor was placed at the articular margin of the humeral head. The 4 suture limbs of this anchor were passed medially through the rotator cuff using a first pass suture passer. 3 additional smart stitches were passed through the lateral border of the rotator cuff. All 4 smart stitches were then anchored to the humeral head using Magnum 2 anchors. These anchors were tensioned to allow for anatomic reduction of the rotator cuff to the greater tuberosity. Arthroscopic images both externally and from the glenohumeral joint arthroscopically were taken of the repair.  All incisions were copiously irrigated. The deltoid fascia was repaired using a 0 Vicryl. The subcutaneous tissue of all incisions were closed with a 2-0 Vicryl. The 3 arthroscopic incisions were closed with 4-0 nylon. The saber incision was closed with a running 4-0 undyed Monocryl.  All sharp and it instrument counts were correct at the conclusion of the case. I was scrubbed and present for the entire case. I spoke with the patient's family postoperatively to let them know the case it done without complication and the patient was stable in recovery room. Patient was placed in an abduction sling, with a Polar Caresleeve, a TENS unit.  Timoteo Gaul, MD

## 2014-06-27 NOTE — Anesthesia Preprocedure Evaluation (Signed)
Anesthesia Evaluation  Patient identified by MRN, date of birth, ID band Patient awake    Reviewed: Allergy & Precautions, H&P , NPO status , Patient's Chart, lab work & pertinent test results, reviewed documented beta blocker date and time   Airway Mallampati: II  TM Distance: >3 FB Neck ROM: Full    Dental   Pulmonary former smoker,          Cardiovascular + Peripheral Vascular Disease     Neuro/Psych    GI/Hepatic GERD-  ,  Endo/Other    Renal/GU      Musculoskeletal   Abdominal   Peds  Hematology   Anesthesia Other Findings   Reproductive/Obstetrics                             Anesthesia Physical Anesthesia Plan  ASA: II  Anesthesia Plan: General   Post-op Pain Management: MAC Combined w/ Regional for Post-op pain   Induction: Intravenous  Airway Management Planned: Oral ETT  Additional Equipment:   Intra-op Plan:   Post-operative Plan: Extubation in OR  Informed Consent: I have reviewed the patients History and Physical, chart, labs and discussed the procedure including the risks, benefits and alternatives for the proposed anesthesia with the patient or authorized representative who has indicated his/her understanding and acceptance.     Plan Discussed with: CRNA  Anesthesia Plan Comments:         Anesthesia Quick Evaluation

## 2014-06-27 NOTE — OR Nursing (Addendum)
Total Irrigation used: 27,000 LR mixed with Epi 457mL Nacl mixed with GU

## 2014-06-27 NOTE — Anesthesia Postprocedure Evaluation (Signed)
  Anesthesia Post-op Note  Patient: Edward Rodriguez  Procedure(s) Performed: Procedure(s): ARTHROSCOPY SHOULDER- mini open rotator cuff repair (Left)  Anesthesia type:General  Patient location: PACU  Post pain: Pain level controlled  Post assessment: Post-op Vital signs reviewed, Patient's Cardiovascular Status Stable, Respiratory Function Stable, Patent Airway and No signs of Nausea or vomiting  Post vital signs: Reviewed and stable  Last Vitals:  Filed Vitals:   06/27/14 1513  BP:   Pulse: 80  Temp:   Resp: 16    Level of consciousness: awake, alert  and patient cooperative  Complications: No apparent anesthesia complications

## 2014-06-28 ENCOUNTER — Encounter: Payer: Self-pay | Admitting: Orthopedic Surgery

## 2014-06-28 DIAGNOSIS — M75112 Incomplete rotator cuff tear or rupture of left shoulder, not specified as traumatic: Secondary | ICD-10-CM | POA: Diagnosis not present

## 2014-06-28 NOTE — Progress Notes (Signed)
  Subjective:  Patient postoperative day 1 from a left mini open rotator cuff repair. Patient reports pain as mild.  His wife is at the bedside. Patient's pain is well-controlled. He has no complaints. He is ready for discharge home.  Objective:   VITALS:   Filed Vitals:   06/27/14 2355 06/28/14 0346 06/28/14 0811 06/28/14 1218  BP: 122/64 118/65 126/69 125/70  Pulse: 92 85 81 86  Temp: 98.2 F (36.8 C) 98.5 F (36.9 C) 97.8 F (36.6 C) 98.1 F (36.7 C)  TempSrc: Oral Oral Oral Oral  Resp: 18 18 18 18   SpO2: 97% 96% 97% 94%    Neurovascular intact Sensation intact distally Intact pulses distally Incision: dressing C/D/I  LABS  No results found for this or any previous visit (from the past 24 hour(s)).  No results found.  Assessment/Plan: 1 Day Post-Op   Active Problems:   Pain in joint, shoulder region   S/P rotator cuff repair   Patient is doing very well postop. I made adjustments to his sling. We discussed use of Polar Care and TENS unit. He'll follow up my office in 1 week. Patient will use the sling continuously until then. Call my office with any questions.   Thornton Park , MD 06/28/2014, 1:06 PM

## 2014-06-28 NOTE — Progress Notes (Signed)
Pt alert and oriented. Polar care to left shoulder with tens unit jn place. Medication effective for pain. Ambulated to bathroom with no problem.  No acute distress noted

## 2014-06-28 NOTE — Progress Notes (Signed)
Order from md to discharge patient to home today, spouse at the bedside, discharge instructions given per md orde, home and new medications reviewed, rx.slip for oxycodone given. IV site removed- cath tip intact. Waiting for orderly for discharge.

## 2014-06-28 NOTE — Progress Notes (Signed)
Discharge via wheelchair with B, tech.

## 2014-06-28 NOTE — Care Management Note (Signed)
Case Management Note  Patient Details  Name: Edward Rodriguez MRN: [id number] Date of Birth: 02/15/1940  Subjective/Objective:    It is reported that pt will be discharged today s/p left mini rotator cuff repair. RNCM met with pt to discuss RNCM role and discharge planning. Pt lives at home with his wife. Prior to admission, pt was independent with adls, active and driving. He would like to do his physical therapy outpatient at [facility]. He gets his medications filled at [facility].  Will follow up later today for discharge orders.               Action/Plan:   Expected Discharge Date:  06/28/14               Expected Discharge Plan:  Home/Self Care  In-House Referral:     Discharge planning Services  CM Consult  Post Acute Care Choice:    Choice offered to:     DME Arranged:    DME Agency:     HH Arranged:    HH Agency:     Status of Service:  Completed, signed off  Medicare Important Message Given:  Yes Date Medicare IM Given:  06/28/14 Medicare IM give by:  Orvan July Date Additional Medicare IM Given:    Additional Medicare Important Message give by:     If discussed at [place] of Stay Meetings, dates discussed:    Additional Comments:  Jolly Mango, RN 06/28/2014, 9:44 AM

## 2014-07-11 NOTE — Discharge Summary (Signed)
Physician Discharge Summary  Patient ID: Edward Rodriguez MRN: [id number] DOB/AGE: 02/15/1940 74 y.o.  Admit date: 06/27/2014 Discharge date: 07/11/2014  Admission Diagnoses:  Left shoulder rotator cuff repair  Discharge Diagnoses:  Active Problems:   Pain in joint, shoulder region   S/P rotator cuff repair   Past Medical History  Diagnosis Date  . GERD (gastroesophageal reflux disease)   . Vertigo 06/2014    had an episode in 2013.  Marland Kitchen Anxiety     Surgeries: Procedure(s): ARTHROSCOPY SHOULDER- mini open rotator cuff repair on 06/27/2014   Consultants (if any):    Discharged Condition: Improved  Hospital Course: Edward Rodriguez is an 74 y.o. male who was admitted 06/27/2014 with a diagnosis of post op pain following mini open rotator cuff repair and went to the operating room on 06/27/2014 and underwent the above named procedures.    He was given perioperative antibiotics:  Anti-infectives    Start     Dose/Rate Route Frequency Ordered Stop   06/27/14 1645  ceFAZolin (ANCEF) IVPB 1 g/50 mL premix     1 g 100 mL/hr over 30 Minutes Intravenous Every 6 hours 06/27/14 1630 06/28/14 1044   06/27/14 1645  ceFAZolin (ANCEF) IVPB 1 g/50 mL premix  Status:  Discontinued     1 g 100 mL/hr over 30 Minutes Intravenous Every 6 hours 06/27/14 1630 06/27/14 1633   06/27/14 0736  ceFAZolin (ANCEF) 2-3 GM-% IVPB SOLR    Comments:  Slemenda, Debbie: cabinet override      06/27/14 0736 06/27/14 1141   06/27/14 0730  ceFAZolin (ANCEF) IVPB 2 g/50 mL premix  Status:  Discontinued     2 g 100 mL/hr over 30 Minutes Intravenous  Once 06/27/14 0729 06/27/14 1534    .  Patient's post-op pain was well controlled overnight.  Given his clinical improvement, he was charged home on postop day #1  He benefited maximally from the hospital stay and there were no complications.    Recent vital signs:  Filed Vitals:   06/28/14 1403  BP: 125/68  Pulse: 81  Temp: 98.1 F (36.7 C)  Resp: 18    Recent  laboratory studies:  Lab Results  Component Value Date   HGB 15.7 06/19/2014   Lab Results  Component Value Date   WBC 7.7 06/19/2014   PLT 215 06/19/2014   Lab Results  Component Value Date   INR 0.89 06/19/2014   Lab Results  Component Value Date   NA 142 06/19/2014   K 4.2 06/19/2014   CL 105 06/19/2014   CO2 32 06/19/2014   BUN 16 06/19/2014   CREATININE 1.09 06/19/2014   GLUCOSE 103* 06/19/2014    Discharge Medications:     Medication List    STOP taking these medications        HYDROcodone-acetaminophen 5-325 MG per tablet  Commonly known as:  NORCO/VICODIN     methylPREDNIsolone 4 MG tablet  Commonly known as:  MEDROL DOSPACK      TAKE these medications        docusate sodium 100 MG capsule  Commonly known as:  COLACE  Take 1 capsule (100 mg total) by mouth 2 (two) times daily.     fluticasone 50 MCG/ACT nasal spray  Commonly known as:  FLONASE  Place 2 sprays into the nose every morning.     Hydrocortisone Ace-Pramoxine 2.5-1 % Lotn  Place 1 Tube rectally 3 (three) times daily as needed (hemorrhoids). Apply 2-3 times a day  ibuprofen 200 MG tablet  Commonly known as:  ADVIL,MOTRIN  Take 200 mg by mouth every 6 (six) hours as needed for pain.     meclizine 25 MG tablet  Commonly known as:  ANTIVERT  Take 25 mg by mouth 3 (three) times daily as needed for dizziness.     omeprazole 20 MG capsule  Commonly known as:  PRILOSEC  Take 40 mg by mouth every morning.     oxyCODONE 5 MG immediate release tablet  Commonly known as:  Oxy IR/ROXICODONE  Take 1-2 tablets (5-10 mg total) by mouth every 3 (three) hours as needed for breakthrough pain.     venlafaxine XR 150 MG 24 hr capsule  Commonly known as:  EFFEXOR-XR  Take 150 mg by mouth every morning.        Diagnostic Studies: No results found.  Disposition: 01-Home or Self Care      Discharge Instructions    Call MD / Call 911    Complete by:  As directed   If you experience chest  pain or shortness of breath, CALL 911 and be transported to the hospital emergency room.  If you develope a fever above 101 F, pus (white drainage) or increased drainage or redness at the wound, or calf pain, call your surgeon's office.     Constipation Prevention    Complete by:  As directed   Drink plenty of fluids.  Prune juice may be helpful.  You may use a stool softener, such as Colace (over the counter) 100 mg twice a day.  Use MiraLax (over the counter) for constipation as needed.     Diet - low sodium heart healthy    Complete by:  As directed      Discharge instructions    Complete by:  As directed   Do not try and lift your arm away from your body for any reason.  Patient must wear the sling at all times including sleep for a total of 4 weeks.  Patient may only remove sling to shower after bandage is removed in 3 days, or if patient is working with their physical therapist after their first post-op visit with the surgeon..  Patient may remove the surgical dressing in 3 days.   Leave the Steri-Strips in place.  They will be removed in the surgeon's office.     Driving restrictions    Complete by:  As directed   No driving for 4 weeks     Increase activity slowly as tolerated    Complete by:  As directed      Lifting restrictions    Complete by:  As directed   No lifting for 12 weeks     Tens unit    Complete by:  As directed            Follow-up Information    Schedule an appointment as soon as possible for a visit with Thornton Park, MD.   Specialty:  Orthopedic Surgery   Why:  for post-op follow up   Contact information:   [place] [place] [id number] [phone]        Signed: Thornton Park ,MD 07/11/2014, 9:49 PM

## 2014-07-12 ENCOUNTER — Encounter: Payer: Self-pay | Admitting: Family Medicine

## 2014-07-16 ENCOUNTER — Ambulatory Visit (INDEPENDENT_AMBULATORY_CARE_PROVIDER_SITE_OTHER): Payer: Commercial Managed Care - HMO | Admitting: Family Medicine

## 2014-07-16 ENCOUNTER — Encounter: Payer: Self-pay | Admitting: Family Medicine

## 2014-07-16 VITALS — BP 110/72 | HR 84 | Temp 97.6°F | Resp 16 | Ht 71.0 in | Wt 197.6 lb

## 2014-07-16 DIAGNOSIS — H9201 Otalgia, right ear: Secondary | ICD-10-CM | POA: Diagnosis not present

## 2014-07-16 DIAGNOSIS — H9319 Tinnitus, unspecified ear: Secondary | ICD-10-CM | POA: Insufficient documentation

## 2014-07-16 DIAGNOSIS — Z0000 Encounter for general adult medical examination without abnormal findings: Secondary | ICD-10-CM | POA: Diagnosis not present

## 2014-07-16 DIAGNOSIS — F419 Anxiety disorder, unspecified: Secondary | ICD-10-CM | POA: Insufficient documentation

## 2014-07-16 DIAGNOSIS — H6980 Other specified disorders of Eustachian tube, unspecified ear: Secondary | ICD-10-CM | POA: Insufficient documentation

## 2014-07-16 DIAGNOSIS — R000 Tachycardia, unspecified: Secondary | ICD-10-CM | POA: Insufficient documentation

## 2014-07-16 DIAGNOSIS — R0602 Shortness of breath: Secondary | ICD-10-CM | POA: Insufficient documentation

## 2014-07-16 DIAGNOSIS — H8110 Benign paroxysmal vertigo, unspecified ear: Secondary | ICD-10-CM | POA: Insufficient documentation

## 2014-07-16 DIAGNOSIS — R110 Nausea: Secondary | ICD-10-CM | POA: Insufficient documentation

## 2014-07-16 DIAGNOSIS — K219 Gastro-esophageal reflux disease without esophagitis: Secondary | ICD-10-CM | POA: Insufficient documentation

## 2014-07-16 DIAGNOSIS — L719 Rosacea, unspecified: Secondary | ICD-10-CM | POA: Insufficient documentation

## 2014-07-16 NOTE — Progress Notes (Signed)
Subjective:     Patient ID: Edward Rodriguez, male   DOB: 1940/10/03, 74 y.o.   MRN: [id number]  HPI  Chief Complaint  Patient presents with  . Annual Exam    Patient would like to discuss problems with ears. He states that a month ago he experienced vertigo and has still experienced symptoms on/off, he states at night he has pain in ear and is tender to the touch.      Review of Systems General: Feeling well; Reports Tdap < 10 years ago after an injury. HEENT: regular dental visits ([facility]) and goes to [facility] for eye exams. Reports nocturnal posterior right ear pain. Chronic tinnitus. Denies Bruxism. Cardiovascular: no chest pain, shortness of breath, or palpitations GI: no heartburn controlled with every other day use of omeprazole. no change in bowel habits or blood in the stool GU: nocturia x 1-2, no change in bladder habits  Neuro: no change in memory Psychiatric: not depressed: venlafaxine controls irritability and anxiety. Musculoskeletal: Recovering from left shoulder surgery with use of sling, pain medication, and physical therapy     Objective:   Physical Exam  Constitutional: He is oriented to person, place, and time. He appears well-developed and well-nourished. No distress (wearing a sling on left shoulder).  HENT:  Right Ear: No tenderness. Tympanic membrane is not injected.  Left Ear: Tympanic membrane normal. No tenderness.  Mouth/Throat:    Right upper posterior molar appears broken off at gum line  Eyes: EOM are normal. Pupils are equal, round, and reactive to light.  Neck: No thyromegaly present.  Cardiovascular: Normal rate and regular rhythm.   No murmur heard. Pulmonary/Chest: Breath sounds normal.  Abdominal: Soft. He exhibits no mass.  Genitourinary:  Not tested due to recent shoulder surgery and difficulty with positioning.  Musculoskeletal: He exhibits no edema.  Decreased cervical ROM in extension and laterally but does not elicit ear pain    Lymphadenopathy:    He has no cervical adenopathy.  Neurological: He is alert and oriented to person, place, and time.  Psychiatric: His behavior is normal.       Assessment:     1. Medicare annual wellness visit, subsequent  - COMPLETE METABOLIC PANEL WITH GFR - PSA - PSA, total and free - Lipid Profile  2. Ear pain, right      Plan:         Discussed having dental evaluation first as possible cause of right posterior ear pain.

## 2014-07-16 NOTE — Patient Instructions (Addendum)
Dental visit to evaluate teeth as source of ear pain

## 2014-07-18 ENCOUNTER — Telehealth: Payer: Self-pay

## 2014-07-18 LAB — LIPID PANEL
Chol/HDL Ratio: 4 ratio units (ref 0.0–5.0)
Cholesterol, Total: 185 mg/dL (ref 100–199)
HDL: 46 mg/dL (ref >39)
LDL Calculated: 111 mg/dL — ABNORMAL HIGH (ref 0–99)
TRIGLYCERIDES: 140 mg/dL (ref 0–149)
VLDL Cholesterol Cal: 28 mg/dL (ref 5–40)

## 2014-07-18 LAB — PSA, TOTAL AND FREE
PROSTATE SPECIFIC AG, SERUM: 2.6 ng/mL (ref 0.0–4.0)
PSA, Free Pct: 21.5 %
PSA, Free: 0.56 ng/mL

## 2014-07-18 NOTE — Telephone Encounter (Signed)
Patient advised of lab results

## 2014-07-18 NOTE — Telephone Encounter (Signed)
-----   Message from Carmon Ginsberg, [place] sent at 07/18/2014  9:30 AM EDT ----- Lipid profile and PSA ok. Still await results of Metabolic profile.

## 2014-07-23 ENCOUNTER — Telehealth: Payer: Self-pay

## 2014-07-23 NOTE — Telephone Encounter (Signed)
Patient advised of lab report

## 2014-07-23 NOTE — Telephone Encounter (Signed)
-----   Message from Carmon Ginsberg, [place] sent at 07/23/2014 12:28 PM EDT ----- Cholesterol and PSA ok. Metabolic profile pending

## 2014-07-26 NOTE — Addendum Note (Signed)
Addended by: Quay Burow on: 07/26/2014 11:39 AM   Modules accepted: Orders, SmartSet

## 2014-07-30 ENCOUNTER — Other Ambulatory Visit: Payer: Self-pay | Admitting: Family Medicine

## 2014-07-30 DIAGNOSIS — F419 Anxiety disorder, unspecified: Secondary | ICD-10-CM

## 2014-07-30 MED ORDER — VENLAFAXINE HCL ER 150 MG PO CP24: 150.0000 mg | ORAL_CAPSULE | ORAL | Status: DC

## 2014-08-03 ENCOUNTER — Other Ambulatory Visit: Payer: Self-pay | Admitting: Family Medicine

## 2014-08-03 DIAGNOSIS — Z0000 Encounter for general adult medical examination without abnormal findings: Secondary | ICD-10-CM

## 2014-08-04 LAB — COMPREHENSIVE METABOLIC PANEL
ALT: 23 IU/L (ref 0–44)
AST: 20 IU/L (ref 0–40)
Albumin/Globulin Ratio: 1.9 (ref 1.1–2.5)
Albumin: 4.2 g/dL (ref 3.5–4.8)
Alkaline Phosphatase: 81 IU/L (ref 39–117)
BUN/Creatinine Ratio: 11 (ref 10–22)
BUN: 12 mg/dL (ref 8–27)
Bilirubin Total: <0.2 mg/dL (ref 0.0–1.2)
CO2: 27 mmol/L (ref 18–29)
CREATININE: 1.07 mg/dL (ref 0.76–1.27)
Calcium: 9.5 mg/dL (ref 8.6–10.2)
Chloride: 101 mmol/L (ref 97–108)
GFR calc non Af Amer: 68 mL/min/{1.73_m2} (ref >59)
GFR, EST AFRICAN AMERICAN: 79 mL/min/{1.73_m2} (ref >59)
Globulin, Total: 2.2 g/dL (ref 1.5–4.5)
Glucose: 112 mg/dL — ABNORMAL HIGH (ref 65–99)
Potassium: 4.1 mmol/L (ref 3.5–5.2)
Sodium: 143 mmol/L (ref 134–144)
Total Protein: 6.4 g/dL (ref 6.0–8.5)

## 2014-08-06 ENCOUNTER — Telehealth: Payer: Self-pay

## 2014-08-06 NOTE — Telephone Encounter (Signed)
Left message to call back  

## 2014-08-06 NOTE — Telephone Encounter (Signed)
-----   Message from Carmon Ginsberg, [place] sent at 08/06/2014  7:55 AM EDT ----- Sugar is mildly elevated. Suspect this is due to less activity due to your shoulder problems. Encourage regular exercise when you are able. Consider repeating your glucose in 6 months

## 2014-08-07 NOTE — Telephone Encounter (Signed)
Patient has been advised

## 2015-02-19 ENCOUNTER — Other Ambulatory Visit: Payer: Self-pay | Admitting: Family Medicine

## 2015-02-19 DIAGNOSIS — R739 Hyperglycemia, unspecified: Secondary | ICD-10-CM | POA: Diagnosis not present

## 2015-02-20 ENCOUNTER — Telehealth: Payer: Self-pay

## 2015-02-20 LAB — RENAL FUNCTION PANEL
Albumin: 4.5 g/dL (ref 3.5–4.8)
BUN / CREAT RATIO: 10 (ref 10–22)
BUN: 11 mg/dL (ref 8–27)
CO2: 24 mmol/L (ref 18–29)
CREATININE: 1.07 mg/dL (ref 0.76–1.27)
Calcium: 9.7 mg/dL (ref 8.6–10.2)
Chloride: 101 mmol/L (ref 96–106)
GFR calc non Af Amer: 68 mL/min/{1.73_m2} (ref >59)
GFR, EST AFRICAN AMERICAN: 79 mL/min/{1.73_m2} (ref >59)
Glucose: 78 mg/dL (ref 65–99)
Phosphorus: 3.2 mg/dL (ref 2.5–4.5)
Potassium: 4.6 mmol/L (ref 3.5–5.2)
SODIUM: 145 mmol/L — AB (ref 134–144)

## 2015-02-20 NOTE — Telephone Encounter (Signed)
LMTCB

## 2015-02-20 NOTE — Telephone Encounter (Signed)
Patient advised as directed below. Patient verbalized understanding.  

## 2015-02-20 NOTE — Telephone Encounter (Signed)
-----   Message from Carmon Ginsberg, [place] sent at 02/20/2015  7:52 AM EST ----- Sugar and kidney function are ok.[[

## 2015-02-25 DIAGNOSIS — M1712 Unilateral primary osteoarthritis, left knee: Secondary | ICD-10-CM | POA: Diagnosis not present

## 2015-02-25 DIAGNOSIS — M25552 Pain in left hip: Secondary | ICD-10-CM | POA: Diagnosis not present

## 2015-03-04 DIAGNOSIS — M1712 Unilateral primary osteoarthritis, left knee: Secondary | ICD-10-CM | POA: Diagnosis not present

## 2015-03-11 DIAGNOSIS — M1712 Unilateral primary osteoarthritis, left knee: Secondary | ICD-10-CM | POA: Diagnosis not present

## 2015-04-30 DIAGNOSIS — L578 Other skin changes due to chronic exposure to nonionizing radiation: Secondary | ICD-10-CM | POA: Diagnosis not present

## 2015-04-30 DIAGNOSIS — L821 Other seborrheic keratosis: Secondary | ICD-10-CM | POA: Diagnosis not present

## 2015-04-30 DIAGNOSIS — L570 Actinic keratosis: Secondary | ICD-10-CM | POA: Diagnosis not present

## 2015-04-30 DIAGNOSIS — L820 Inflamed seborrheic keratosis: Secondary | ICD-10-CM | POA: Diagnosis not present

## 2015-05-07 DIAGNOSIS — M1612 Unilateral primary osteoarthritis, left hip: Secondary | ICD-10-CM | POA: Diagnosis not present

## 2015-05-07 DIAGNOSIS — M7542 Impingement syndrome of left shoulder: Secondary | ICD-10-CM | POA: Diagnosis not present

## 2015-05-07 DIAGNOSIS — M1712 Unilateral primary osteoarthritis, left knee: Secondary | ICD-10-CM | POA: Diagnosis not present

## 2015-06-03 ENCOUNTER — Other Ambulatory Visit: Payer: Self-pay | Admitting: Family Medicine

## 2015-08-04 ENCOUNTER — Other Ambulatory Visit: Payer: Self-pay | Admitting: Family Medicine

## 2015-08-08 ENCOUNTER — Encounter: Payer: Commercial Managed Care - HMO | Admitting: Family Medicine

## 2015-08-09 ENCOUNTER — Ambulatory Visit (INDEPENDENT_AMBULATORY_CARE_PROVIDER_SITE_OTHER): Payer: PPO | Admitting: Family Medicine

## 2015-08-09 ENCOUNTER — Encounter: Payer: Self-pay | Admitting: Family Medicine

## 2015-08-09 VITALS — BP 112/72 | HR 77 | Temp 98.4°F | Resp 16 | Ht 70.75 in | Wt 189.0 lb

## 2015-08-09 DIAGNOSIS — N528 Other male erectile dysfunction: Secondary | ICD-10-CM | POA: Diagnosis not present

## 2015-08-09 DIAGNOSIS — M1990 Unspecified osteoarthritis, unspecified site: Secondary | ICD-10-CM | POA: Insufficient documentation

## 2015-08-09 DIAGNOSIS — M1612 Unilateral primary osteoarthritis, left hip: Secondary | ICD-10-CM | POA: Diagnosis not present

## 2015-08-09 DIAGNOSIS — M7542 Impingement syndrome of left shoulder: Secondary | ICD-10-CM | POA: Diagnosis not present

## 2015-08-09 DIAGNOSIS — K219 Gastro-esophageal reflux disease without esophagitis: Secondary | ICD-10-CM

## 2015-08-09 DIAGNOSIS — F419 Anxiety disorder, unspecified: Secondary | ICD-10-CM | POA: Diagnosis not present

## 2015-08-09 DIAGNOSIS — N529 Male erectile dysfunction, unspecified: Secondary | ICD-10-CM

## 2015-08-09 DIAGNOSIS — M7062 Trochanteric bursitis, left hip: Secondary | ICD-10-CM | POA: Diagnosis not present

## 2015-08-09 DIAGNOSIS — Z0000 Encounter for general adult medical examination without abnormal findings: Secondary | ICD-10-CM

## 2015-08-09 NOTE — Progress Notes (Signed)
Subjective:     Patient ID: Edward Rodriguez, male   DOB: 1940/11/30, 75 y.o.   MRN: [id number]  HPI  Chief Complaint  Patient presents with  . Medicare Wellness  States he is walking regularly for 45 minutes 3-4 x week. Continues to golf as well.   Review of Systems General: Feeling well, Immunizations reviewed. Reports tetanus after an injury several years ago. HEENT: regular dental visits (Dr. Juleen China) and pending eye exam at [facility]. Chronic tinnitus (buzzing). Cardiovascular: no chest pain, shortness of breath, or palpitations GI: no heartburn as controlled with qod omeprazole. Discussed switching to Zantac or Pepcid. No change in bowel habits or blood in the stool. Due to colonoscopy next year. GU: nocturia x 1, no change in bladder habits, occasional use of sildenafil he gets on-line. Neuro: no change in memory, defers cognitive screen Psychiatric: not depressed, states venlafaxine controls anxiety and irritability Musculoskeletal: Sees orthopedics, Dr. Mack Guise, regularly for left hip arthritis. Considering hip replacement. Skin: sees dermatologist, Dr. Nehemiah Massed, twice a year for skin surveys.    Objective:   Physical Exam  Constitutional: He appears well-developed and well-nourished. No distress.  Eyes: PERRLA, EOMI Neck: no thyromegaly, tenderness or nodules, no carotid bruits or cervical adenopathy ENT: TM's intact without inflammation; No tonsillar enlargement or exudate, Lungs: Clear Heart : RRR without murmur or gallop Abd: bowel sounds present, soft, non-tender, no organomegaly Rectal: Prostate firm and non-tender Extremities: no edema     Assessment:    1. Medicare annual wellness visit, subsequent - Comprehensive metabolic panel - Lipid panel - PSA  2. Anxiety: continue venlafaxine  3. Gastroesophageal reflux disease without esophagitis: continue omeprazole qod as needed 4. E.D.: sildenafil prn 5. Osteoarthritis of left hip: per orthopedics    Plan:   Further f/u pending lab work.

## 2015-08-09 NOTE — Patient Instructions (Signed)
We will call you with the lab work. 

## 2015-08-20 DIAGNOSIS — Z0000 Encounter for general adult medical examination without abnormal findings: Secondary | ICD-10-CM | POA: Diagnosis not present

## 2015-08-21 ENCOUNTER — Telehealth: Payer: Self-pay

## 2015-08-21 LAB — COMPREHENSIVE METABOLIC PANEL
A/G RATIO: 1.8 (ref 1.2–2.2)
ALK PHOS: 65 IU/L (ref 39–117)
ALT: 13 IU/L (ref 0–44)
AST: 13 IU/L (ref 0–40)
Albumin: 4 g/dL (ref 3.5–4.8)
BUN/Creatinine Ratio: 15 (ref 10–24)
BUN: 16 mg/dL (ref 8–27)
Bilirubin Total: 0.4 mg/dL (ref 0.0–1.2)
CALCIUM: 9.3 mg/dL (ref 8.6–10.2)
CO2: 27 mmol/L (ref 18–29)
Chloride: 103 mmol/L (ref 96–106)
Creatinine, Ser: 1.1 mg/dL (ref 0.76–1.27)
GFR calc Af Amer: 76 mL/min/{1.73_m2} (ref >59)
GFR, EST NON AFRICAN AMERICAN: 66 mL/min/{1.73_m2} (ref >59)
GLOBULIN, TOTAL: 2.2 g/dL (ref 1.5–4.5)
Glucose: 82 mg/dL (ref 65–99)
POTASSIUM: 4.5 mmol/L (ref 3.5–5.2)
SODIUM: 145 mmol/L — AB (ref 134–144)
Total Protein: 6.2 g/dL (ref 6.0–8.5)

## 2015-08-21 LAB — LIPID PANEL
CHOLESTEROL TOTAL: 177 mg/dL (ref 100–199)
Chol/HDL Ratio: 3.5 ratio units (ref 0.0–5.0)
HDL: 51 mg/dL (ref >39)
LDL Calculated: 100 mg/dL — ABNORMAL HIGH (ref 0–99)
Triglycerides: 128 mg/dL (ref 0–149)
VLDL CHOLESTEROL CAL: 26 mg/dL (ref 5–40)

## 2015-08-21 LAB — PSA: Prostate Specific Ag, Serum: 2.7 ng/mL (ref 0.0–4.0)

## 2015-08-21 NOTE — Telephone Encounter (Signed)
lmtcb Sasan Wilkie Drozdowski, CMA  

## 2015-08-21 NOTE — Telephone Encounter (Signed)
-----   Message from Carmon Ginsberg, [place] sent at 08/21/2015  7:50 AM EDT ----- Labs look good. Repeat annually

## 2015-08-21 NOTE — Telephone Encounter (Signed)
Advised pt of lab results. Pt verbally acknowledges understanding. Emily Drozdowski, CMA   

## 2015-10-16 DIAGNOSIS — M7062 Trochanteric bursitis, left hip: Secondary | ICD-10-CM | POA: Diagnosis not present

## 2015-10-16 DIAGNOSIS — M7542 Impingement syndrome of left shoulder: Secondary | ICD-10-CM | POA: Diagnosis not present

## 2015-11-04 ENCOUNTER — Other Ambulatory Visit: Payer: Self-pay | Admitting: Family Medicine

## 2015-11-05 DIAGNOSIS — Z1283 Encounter for screening for malignant neoplasm of skin: Secondary | ICD-10-CM | POA: Diagnosis not present

## 2015-11-05 DIAGNOSIS — L300 Nummular dermatitis: Secondary | ICD-10-CM | POA: Diagnosis not present

## 2015-11-05 DIAGNOSIS — L718 Other rosacea: Secondary | ICD-10-CM | POA: Diagnosis not present

## 2015-11-05 DIAGNOSIS — L578 Other skin changes due to chronic exposure to nonionizing radiation: Secondary | ICD-10-CM | POA: Diagnosis not present

## 2015-11-05 DIAGNOSIS — L720 Epidermal cyst: Secondary | ICD-10-CM | POA: Diagnosis not present

## 2015-11-05 DIAGNOSIS — L570 Actinic keratosis: Secondary | ICD-10-CM | POA: Diagnosis not present

## 2015-11-05 DIAGNOSIS — B078 Other viral warts: Secondary | ICD-10-CM | POA: Diagnosis not present

## 2015-11-11 ENCOUNTER — Ambulatory Visit (INDEPENDENT_AMBULATORY_CARE_PROVIDER_SITE_OTHER): Payer: PPO | Admitting: Family Medicine

## 2015-11-11 ENCOUNTER — Encounter: Payer: Self-pay | Admitting: Family Medicine

## 2015-11-11 VITALS — BP 110/72 | HR 74 | Temp 97.6°F | Resp 16 | Wt 185.0 lb

## 2015-11-11 DIAGNOSIS — Z23 Encounter for immunization: Secondary | ICD-10-CM | POA: Diagnosis not present

## 2015-11-11 DIAGNOSIS — W57XXXA Bitten or stung by nonvenomous insect and other nonvenomous arthropods, initial encounter: Secondary | ICD-10-CM | POA: Diagnosis not present

## 2015-11-11 NOTE — Progress Notes (Signed)
Subjective:     Patient ID: Edward Rodriguez, male   DOB: December 30, 1940, 75 y.o.   MRN: [id number]  HPI  Chief Complaint  Patient presents with  . Rash    Patient comes in office today with concerns of rash on the lower right side of abdomen. Patient states that rash has been present for two weeks, he states that it is red and itchy.   Reports he was prescribed TAC cream per his dermatologist a few days ago. As he wanted to get the flu shot wished to get it checked out beforehand.   Review of Systems     Objective:   Physical Exam  Constitutional: He appears well-developed and well-nourished. No distress.  Skin:  Right antero- lateral chest wall with an inflamed papule c/w insect bite.       Assessment:    1. Insect bite, initial encounter  2. Need for influenza vaccination - Flu vaccine HIGH DOSE PF    Plan:    Continue with TAC cream.

## 2015-11-11 NOTE — Patient Instructions (Signed)
Continue triamcinolone cream.

## 2015-11-28 ENCOUNTER — Other Ambulatory Visit: Payer: Self-pay | Admitting: Family Medicine

## 2015-12-19 ENCOUNTER — Telehealth: Payer: Self-pay | Admitting: Family Medicine

## 2015-12-19 NOTE — Telephone Encounter (Signed)
Pt is returning a call to [facility].  CB#[phone]/MW

## 2015-12-20 ENCOUNTER — Other Ambulatory Visit: Payer: Self-pay | Admitting: Family Medicine

## 2015-12-20 DIAGNOSIS — Z111 Encounter for screening for respiratory tuberculosis: Secondary | ICD-10-CM

## 2015-12-20 NOTE — Telephone Encounter (Signed)
Discussed get  TB screen; Quantiferon Gold ordered.

## 2015-12-23 LAB — QUANTIFERON IN TUBE
QFT TB AG MINUS NIL VALUE: 0 [IU]/mL
QUANTIFERON MITOGEN VALUE: 8.42 IU/mL
QUANTIFERON NIL VALUE: 0.02 [IU]/mL
QUANTIFERON TB AG VALUE: 0.02 [IU]/mL
QUANTIFERON TB GOLD: NEGATIVE

## 2015-12-23 LAB — QUANTIFERON TB GOLD ASSAY (BLOOD)

## 2015-12-24 ENCOUNTER — Telehealth: Payer: Self-pay

## 2015-12-24 NOTE — Telephone Encounter (Signed)
-----   Message from Carmon Ginsberg, [place] sent at 12/23/2015  5:23 PM EST ----- TB test negative. Health form up front for pickup.

## 2015-12-24 NOTE — Telephone Encounter (Signed)
LMTCB-KW 

## 2015-12-24 NOTE — Telephone Encounter (Signed)
Patient has been advised. KW 

## 2016-01-01 DIAGNOSIS — M1612 Unilateral primary osteoarthritis, left hip: Secondary | ICD-10-CM | POA: Diagnosis not present

## 2016-02-25 ENCOUNTER — Encounter: Payer: Self-pay | Admitting: Podiatry

## 2016-02-25 ENCOUNTER — Ambulatory Visit (INDEPENDENT_AMBULATORY_CARE_PROVIDER_SITE_OTHER): Payer: PPO | Admitting: Podiatry

## 2016-02-25 ENCOUNTER — Ambulatory Visit (INDEPENDENT_AMBULATORY_CARE_PROVIDER_SITE_OTHER): Payer: PPO

## 2016-02-25 DIAGNOSIS — M779 Enthesopathy, unspecified: Secondary | ICD-10-CM | POA: Diagnosis not present

## 2016-02-25 DIAGNOSIS — M76822 Posterior tibial tendinitis, left leg: Secondary | ICD-10-CM | POA: Diagnosis not present

## 2016-02-25 DIAGNOSIS — M79672 Pain in left foot: Secondary | ICD-10-CM

## 2016-02-25 DIAGNOSIS — R52 Pain, unspecified: Secondary | ICD-10-CM

## 2016-03-01 MED ORDER — BETAMETHASONE SOD PHOS & ACET 6 (3-3) MG/ML IJ SUSP: 3.0000 mg | Freq: Once | INTRAMUSCULAR | Status: DC

## 2016-03-01 NOTE — Progress Notes (Signed)
   Subjective:  Patient presents today with left arm pain 1 month. Patient states the pains been ongoing with no alleviating factors. Aggravated by increased walking and weightbearing.    Objective/Physical Exam General: The patient is alert and oriented x3 in no acute distress.  Dermatology: Skin is warm, dry and supple bilateral lower extremities. Negative for open lesions or macerations.  Vascular: Palpable pedal pulses bilaterally. No edema or erythema noted. Capillary refill within normal limits.  Neurological: Epicritic and protective threshold grossly intact bilaterally.   Musculoskeletal Exam: Pain on palpation along the posterior tibial tendon of the left foot extending distally to the mid arch. Range of motion within normal limits to all pedal and ankle joints bilateral. Muscle strength 5/5 in all groups bilateral.   Radiographic Exam:  Normal osseous mineralization. Joint spaces preserved. No fracture/dislocation/boney destruction.    Assessment: #1 posterior tibial tendinitis left foot extending distal to the mid arch   Plan of Care:  #1 Patient was evaluated. #2 injection of 0.5 mL Celestone Soluspan injected in the posterior tibial tendon sheath along the mid arch of the left foot #3 plantar fascial band dispensed #4 return to clinic in 4 weeks   Edrick Kins, DPM [facility]  Dr. Edrick Kins, [address]                                        [place], [place] [address]                Office [phone]  Fax [phone]

## 2016-03-10 DIAGNOSIS — M7062 Trochanteric bursitis, left hip: Secondary | ICD-10-CM | POA: Diagnosis not present

## 2016-03-10 DIAGNOSIS — M7542 Impingement syndrome of left shoulder: Secondary | ICD-10-CM | POA: Diagnosis not present

## 2016-03-10 DIAGNOSIS — M1612 Unilateral primary osteoarthritis, left hip: Secondary | ICD-10-CM | POA: Diagnosis not present

## 2016-03-17 DIAGNOSIS — H43813 Vitreous degeneration, bilateral: Secondary | ICD-10-CM | POA: Diagnosis not present

## 2016-03-24 ENCOUNTER — Ambulatory Visit (INDEPENDENT_AMBULATORY_CARE_PROVIDER_SITE_OTHER): Payer: PPO | Admitting: Podiatry

## 2016-03-24 DIAGNOSIS — M779 Enthesopathy, unspecified: Secondary | ICD-10-CM

## 2016-03-24 DIAGNOSIS — M76822 Posterior tibial tendinitis, left leg: Secondary | ICD-10-CM

## 2016-03-24 DIAGNOSIS — M778 Other enthesopathies, not elsewhere classified: Secondary | ICD-10-CM

## 2016-03-24 MED ORDER — BETAMETHASONE SOD PHOS & ACET 6 (3-3) MG/ML IJ SUSP: 3.0000 mg | Freq: Once | INTRAMUSCULAR | Status: DC

## 2016-03-24 NOTE — Progress Notes (Signed)
   Subjective:  Patient presents today for follow-up evaluation of posterior tibial tendinitis to the left foot extending to the mid arch. Patient states that the injection did help. She no longer experiences pain. He also feels like the plantar fascial band help support his arch. Patient is an avid golfer He does have a new complaint today of pain to the lateral aspect of the patient's left foot. Patient states that he has little bit of pain when walking to the lateral side of the foot.    Objective/Physical Exam General: The patient is alert and oriented x3 in no acute distress.  Dermatology: Skin is warm, dry and supple bilateral lower extremities. Negative for open lesions or macerations.  Vascular: Palpable pedal pulses bilaterally. No edema or erythema noted. Capillary refill within normal limits.  Neurological: Epicritic and protective threshold grossly intact bilaterally.   Musculoskeletal Exam: Pain on palpation noted to the fifth metatarsal tubercle at the insertion of the peroneal brevis tendon consistent with a peroneal enthesopathy left foot. Range of motion within normal limits to all pedal and ankle joints bilateral. Muscle strength 5/5 in all groups bilateral.     Assessment: #1 posterior tibial tendinitis left foot extending distal to the mid arch-resolved #2 peroneal enthesopathy left foot   Plan of Care:  #1 Patient was evaluated. #2 injection of 0.5 mL Celestone Soluspan injected into the insertion of the peroneal brevis tendon to the fifth metatarsal tubercle left foot #3 continue plantar fascial band as needed #4 return to clinic when necessary   Edrick Kins, DPM [facility]  Dr. Edrick Kins, [address]                                        [place], [place] [address]                Office [phone]  Fax [phone]

## 2016-04-28 ENCOUNTER — Ambulatory Visit (INDEPENDENT_AMBULATORY_CARE_PROVIDER_SITE_OTHER): Payer: PPO | Admitting: Family Medicine

## 2016-04-28 ENCOUNTER — Encounter: Payer: Self-pay | Admitting: Family Medicine

## 2016-04-28 VITALS — BP 122/74 | HR 73 | Temp 97.6°F | Resp 16 | Wt 192.0 lb

## 2016-04-28 DIAGNOSIS — S81801A Unspecified open wound, right lower leg, initial encounter: Secondary | ICD-10-CM

## 2016-04-28 MED ORDER — CEPHALEXIN 500 MG PO CAPS: 500.0000 mg | ORAL_CAPSULE | Freq: Two times a day (BID) | ORAL | 0 refills | Status: DC

## 2016-04-28 MED ORDER — SODIUM CHLORIDE 0.9 % EX SOLN: CUTANEOUS | 0 refills | Status: DC

## 2016-04-28 NOTE — Progress Notes (Signed)
Subjective:     Patient ID: Edward Rodriguez, male   DOB: February 28, 1940, 76 y.o.   MRN: [id number]  HPI  Chief Complaint  Patient presents with  . Wound Check    Patient comes in office today with complaints of swelling of his lower right leg. Patient states that on 04/16/16 he had hit his leg on hitch of truck, patient states that wound is healing slowly. In the past week patient has noticed swelling and around site radiating to his ankles and has noticed that it is warm to the touch. Patient denies redness, fever or drainage from wound. There is no record in chart or [facility] of last Td/Tdap vaccine.   States he has been cleaning with hydrogen peroxide and applying abx ointment.   Review of Systems     Objective:   Physical Exam  Constitutional: He appears well-developed and well-nourished. No distress.  Skin:  Right mid-tibial area with 4 cm oval eschar and mild inflammatory flare. Minimally tender with small amount of sero-sanguinous drainage but no underlying induration.       Assessment:    1. Leg wound, right, initial encounter - SODIUM CHLORIDE, EXTERNAL, ([employer] SALINE WOUND WASH) 0.9 % SOLN; Cleanse leg wound twice daily and apply bandaid.  Dispense: 210 mL; Refill: 0 - cephALEXin (KEFLEX) 500 MG capsule; Take 1 capsule (500 mg total) by mouth 2 (two) times daily.  Dispense: 14 capsule; Refill: 0    Plan:    Will follow as needed if not improving. Asked him to pick off eschar as it softens.

## 2016-04-28 NOTE — Patient Instructions (Signed)
Cleanse your leg wound with saline solution twice daily and apply large bandaid. If scab is loose pick it off. Let me know if wound not filling in over the next week or two.

## 2016-05-04 ENCOUNTER — Other Ambulatory Visit: Payer: Self-pay | Admitting: Family Medicine

## 2016-05-06 DIAGNOSIS — L719 Rosacea, unspecified: Secondary | ICD-10-CM | POA: Diagnosis not present

## 2016-05-06 DIAGNOSIS — D229 Melanocytic nevi, unspecified: Secondary | ICD-10-CM | POA: Diagnosis not present

## 2016-05-06 DIAGNOSIS — L821 Other seborrheic keratosis: Secondary | ICD-10-CM | POA: Diagnosis not present

## 2016-05-06 DIAGNOSIS — L578 Other skin changes due to chronic exposure to nonionizing radiation: Secondary | ICD-10-CM | POA: Diagnosis not present

## 2016-05-06 DIAGNOSIS — Z1283 Encounter for screening for malignant neoplasm of skin: Secondary | ICD-10-CM | POA: Diagnosis not present

## 2016-05-06 DIAGNOSIS — L820 Inflamed seborrheic keratosis: Secondary | ICD-10-CM | POA: Diagnosis not present

## 2016-05-06 DIAGNOSIS — L812 Freckles: Secondary | ICD-10-CM | POA: Diagnosis not present

## 2016-05-06 DIAGNOSIS — L300 Nummular dermatitis: Secondary | ICD-10-CM | POA: Diagnosis not present

## 2016-05-06 DIAGNOSIS — L570 Actinic keratosis: Secondary | ICD-10-CM | POA: Diagnosis not present

## 2016-05-19 ENCOUNTER — Encounter: Payer: Self-pay | Admitting: Podiatry

## 2016-05-19 ENCOUNTER — Ambulatory Visit (INDEPENDENT_AMBULATORY_CARE_PROVIDER_SITE_OTHER): Payer: PPO | Admitting: Podiatry

## 2016-05-19 DIAGNOSIS — M76822 Posterior tibial tendinitis, left leg: Secondary | ICD-10-CM

## 2016-05-19 DIAGNOSIS — M779 Enthesopathy, unspecified: Secondary | ICD-10-CM | POA: Diagnosis not present

## 2016-05-19 DIAGNOSIS — M7672 Peroneal tendinitis, left leg: Secondary | ICD-10-CM | POA: Diagnosis not present

## 2016-05-19 DIAGNOSIS — M778 Other enthesopathies, not elsewhere classified: Secondary | ICD-10-CM

## 2016-05-19 NOTE — Progress Notes (Signed)
   Subjective:  Patient presents today for follow-up evaluation of left foot pain. Patient states that the injection and brace did help for a couple of weeks but states the pain has now returned gradually over the last 3 weeks. 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    Objective/Physical Exam General: The patient is alert and oriented x3 in no acute distress.  Dermatology: Skin is warm, dry and supple bilateral lower extremities. Negative for open lesions or macerations.  Vascular: Palpable pedal pulses bilaterally. No edema or erythema noted. Capillary refill within normal limits.  Neurological: Epicritic and protective threshold grossly intact bilaterally.   Musculoskeletal Exam: Pain on palpation noted to the fifth metatarsal tubercle at the insertion of the peroneal brevis tendon consistent with a peroneal enthesopathy left foot. Range of motion within normal limits to all pedal and ankle joints bilateral. Muscle strength 5/5 in all groups bilateral.     Assessment: #1 posterior tibial tendinitis left foot extending distal to the mid arch #2 insertional peroneal tendinitis of the left foot   Plan of Care:  #1 Patient was evaluated. #2 injection of 0.5 mL of Celestone Soluspan injected into the peroneal tendon of the left foot. Care was taken to avoid direct injection into tendon. #3 injection of 0.5 mL Celestone Soluspan injected into the posterior tibial tendon of the left foot. Care was taken to avoid direct injection into tendon. #4 cam boot provided. #5 compression anklet provided. #6 return to clinic in 4 weeks   Edrick Kins, DPM [facility]  Dr. Edrick Kins, [address]                                        [place], [place] [address]                Office [phone]  Fax [phone]

## 2016-05-22 MED ORDER — BETAMETHASONE SOD PHOS & ACET 6 (3-3) MG/ML IJ SUSP: 3.0000 mg | Freq: Once | INTRAMUSCULAR | Status: DC

## 2016-05-25 ENCOUNTER — Ambulatory Visit: Payer: PPO | Admitting: Podiatry

## 2016-06-02 ENCOUNTER — Ambulatory Visit (INDEPENDENT_AMBULATORY_CARE_PROVIDER_SITE_OTHER): Payer: PPO | Admitting: Podiatry

## 2016-06-02 ENCOUNTER — Encounter: Payer: Self-pay | Admitting: Podiatry

## 2016-06-02 DIAGNOSIS — IMO0001 Reserved for inherently not codable concepts without codable children: Secondary | ICD-10-CM

## 2016-06-02 DIAGNOSIS — S93402A Sprain of unspecified ligament of left ankle, initial encounter: Secondary | ICD-10-CM | POA: Diagnosis not present

## 2016-06-02 DIAGNOSIS — M7752 Other enthesopathy of left foot: Secondary | ICD-10-CM | POA: Diagnosis not present

## 2016-06-02 DIAGNOSIS — M76822 Posterior tibial tendinitis, left leg: Secondary | ICD-10-CM

## 2016-06-02 DIAGNOSIS — M659 Synovitis and tenosynovitis, unspecified: Secondary | ICD-10-CM

## 2016-06-02 DIAGNOSIS — M65972 Unspecified synovitis and tenosynovitis, left ankle and foot: Secondary | ICD-10-CM

## 2016-06-02 DIAGNOSIS — M25572 Pain in left ankle and joints of left foot: Secondary | ICD-10-CM | POA: Diagnosis not present

## 2016-06-03 NOTE — Progress Notes (Signed)
   Subjective:  Patient presents today for follow-up evaluation of left foot pain. Patient states the pain is worse than his previous visit. He states the last injection did not help. He reports a new complaint of moderate pain to the left ankle and foot for the past 2 days secondary to stepping off a bus the wrong way. He reports associated swelling. He has not done anything for treatment of these symptoms.   Objective/Physical Exam General: The patient is alert and oriented x3 in no acute distress.  Dermatology: Skin is warm, dry and supple bilateral lower extremities. Negative for open lesions or macerations.  Vascular: Palpable pedal pulses bilaterally. No edema or erythema noted. Capillary refill within normal limits.  Neurological: Epicritic and protective threshold grossly intact bilaterally.   Musculoskeletal Exam: Pain on palpation noted to the fifth metatarsal tubercle at the insertion of the peroneal brevis tendon consistent with a peroneal enthesopathy left foot. Pain with palpation to the left ankle joint. Range of motion within normal limits to all pedal and ankle joints bilateral. Muscle strength 5/5 in all groups bilateral.     Assessment: #1 posterior tibial tendinitis left foot extending distal to the mid arch-improved #2 insertional peroneal tendinitis of the left foot- improved #3 left ankle pain/grade II sprain   Plan of Care:  #1 Patient was evaluated. #2 injection of 0.5 mL of Celestone Soluspan injected into the left ankle #3 left ankle brace dispensed. Discontinue wearing cam boot #4 return to clinic in 4 weeks   Edrick Kins, DPM [facility]  Dr. Edrick Kins, [address]                                        [place], [place] [address]                Office [phone]  Fax [phone]

## 2016-06-07 MED ORDER — BETAMETHASONE SOD PHOS & ACET 6 (3-3) MG/ML IJ SUSP: 3.0000 mg | Freq: Once | INTRAMUSCULAR | Status: DC

## 2016-06-16 ENCOUNTER — Ambulatory Visit: Payer: PPO | Admitting: Podiatry

## 2016-06-16 DIAGNOSIS — M7062 Trochanteric bursitis, left hip: Secondary | ICD-10-CM | POA: Diagnosis not present

## 2016-06-30 ENCOUNTER — Ambulatory Visit (INDEPENDENT_AMBULATORY_CARE_PROVIDER_SITE_OTHER): Payer: PPO | Admitting: Podiatry

## 2016-06-30 ENCOUNTER — Encounter: Payer: Self-pay | Admitting: Podiatry

## 2016-06-30 DIAGNOSIS — M7672 Peroneal tendinitis, left leg: Secondary | ICD-10-CM

## 2016-06-30 NOTE — Progress Notes (Signed)
   Subjective:  Patient presents today for follow-up evaluation of left foot pain. He states the pain has improved however he is still experiencing pain in the left lower extremity when he moves the ankle. Resting, wearing the brace and injection has helped improve the pain.    Objective/Physical Exam General: The patient is alert and oriented x3 in no acute distress.  Dermatology: Skin is warm, dry and supple bilateral lower extremities. Negative for open lesions or macerations.  Vascular: Palpable pedal pulses bilaterally. No edema or erythema noted. Capillary refill within normal limits.  Neurological: Epicritic and protective threshold grossly intact bilaterally.   Musculoskeletal Exam: Pain on palpation noted to the fifth metatarsal tubercle at the insertion of the peroneal brevis tendon consistent with a peroneal enthesopathy left foot. Pain with palpation to the left ankle joint. Range of motion within normal limits to all pedal and ankle joints bilateral. Muscle strength 5/5 in all groups bilateral.     Assessment: #1 peroneal tendinitis left  Plan of Care:  #1 Patient was evaluated. #2 injection of 0.5 mL of Celestone Soluspan injected into the left ankle. #3 continue wearing ankle brace. #4 continue wearing good shoes gear. #5 return to clinic when necessary.  Edrick Kins, DPM [facility]  Dr. Edrick Kins, [place]                                        [place], [place] [address]                Office [phone]  Fax [phone]

## 2016-07-03 MED ORDER — BETAMETHASONE SOD PHOS & ACET 6 (3-3) MG/ML IJ SUSP: 3.0000 mg | Freq: Once | INTRAMUSCULAR | Status: DC

## 2016-08-25 ENCOUNTER — Other Ambulatory Visit: Payer: Self-pay | Admitting: Family Medicine

## 2016-08-31 DIAGNOSIS — M25559 Pain in unspecified hip: Secondary | ICD-10-CM | POA: Insufficient documentation

## 2016-08-31 DIAGNOSIS — M7062 Trochanteric bursitis, left hip: Secondary | ICD-10-CM | POA: Diagnosis not present

## 2016-08-31 DIAGNOSIS — M180 Bilateral primary osteoarthritis of first carpometacarpal joints: Secondary | ICD-10-CM | POA: Insufficient documentation

## 2016-08-31 DIAGNOSIS — M19049 Primary osteoarthritis, unspecified hand: Secondary | ICD-10-CM | POA: Insufficient documentation

## 2016-08-31 DIAGNOSIS — M7060 Trochanteric bursitis, unspecified hip: Secondary | ICD-10-CM | POA: Insufficient documentation

## 2016-10-21 DIAGNOSIS — M1711 Unilateral primary osteoarthritis, right knee: Secondary | ICD-10-CM | POA: Diagnosis not present

## 2016-11-01 ENCOUNTER — Other Ambulatory Visit: Payer: Self-pay | Admitting: Family Medicine

## 2016-11-04 DIAGNOSIS — L300 Nummular dermatitis: Secondary | ICD-10-CM | POA: Diagnosis not present

## 2016-11-04 DIAGNOSIS — L821 Other seborrheic keratosis: Secondary | ICD-10-CM | POA: Diagnosis not present

## 2016-11-04 DIAGNOSIS — L578 Other skin changes due to chronic exposure to nonionizing radiation: Secondary | ICD-10-CM | POA: Diagnosis not present

## 2016-11-04 DIAGNOSIS — Z1283 Encounter for screening for malignant neoplasm of skin: Secondary | ICD-10-CM | POA: Diagnosis not present

## 2016-11-04 DIAGNOSIS — D692 Other nonthrombocytopenic purpura: Secondary | ICD-10-CM | POA: Diagnosis not present

## 2016-11-04 DIAGNOSIS — L812 Freckles: Secondary | ICD-10-CM | POA: Diagnosis not present

## 2016-11-04 DIAGNOSIS — L853 Xerosis cutis: Secondary | ICD-10-CM | POA: Diagnosis not present

## 2016-11-04 DIAGNOSIS — L719 Rosacea, unspecified: Secondary | ICD-10-CM | POA: Diagnosis not present

## 2016-11-04 DIAGNOSIS — L570 Actinic keratosis: Secondary | ICD-10-CM | POA: Diagnosis not present

## 2016-12-08 DIAGNOSIS — L578 Other skin changes due to chronic exposure to nonionizing radiation: Secondary | ICD-10-CM | POA: Diagnosis not present

## 2016-12-08 DIAGNOSIS — L853 Xerosis cutis: Secondary | ICD-10-CM | POA: Diagnosis not present

## 2016-12-08 DIAGNOSIS — L719 Rosacea, unspecified: Secondary | ICD-10-CM | POA: Diagnosis not present

## 2016-12-08 DIAGNOSIS — L300 Nummular dermatitis: Secondary | ICD-10-CM | POA: Diagnosis not present

## 2016-12-08 DIAGNOSIS — L570 Actinic keratosis: Secondary | ICD-10-CM | POA: Diagnosis not present

## 2016-12-10 DIAGNOSIS — M189 Osteoarthritis of first carpometacarpal joint, unspecified: Secondary | ICD-10-CM | POA: Diagnosis not present

## 2016-12-10 IMAGING — MR MRI OF THE LEFT SHOULDER WITHOUT CONTRAST
5 series · 40 of 40 positions shown · non-contrast
Comparison: 11/04/2010 chest CT.

CLINICAL DATA: LEFT shoulder pain. Initial encounter. Severe
chronic LEFT shoulder pain. Rotator cuff tear.

EXAM:
MRI OF THE LEFT SHOULDER WITHOUT CONTRAST
TECHNIQUE: Multiplanar, multisequence MR imaging of the shoulder was performed.
No intravenous contrast was administered.

[Series 3: T2 fat-sat · axial · 4.0mm · 0.47mm/px · z∈[-18,+70]mm · 8 of 21 slices shown]
[im 1/21]
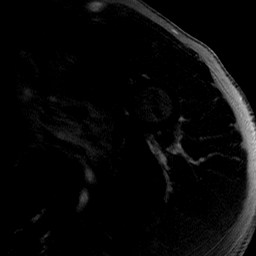
[im 3/21]
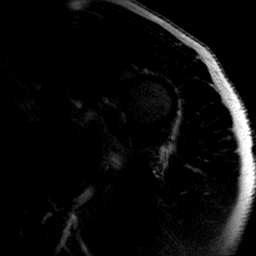
[im 6/21]
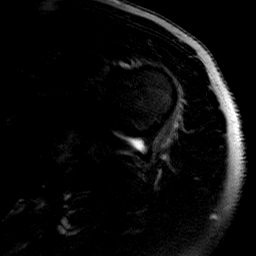
[im 9/21]
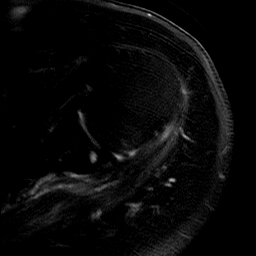
[im 12/21]
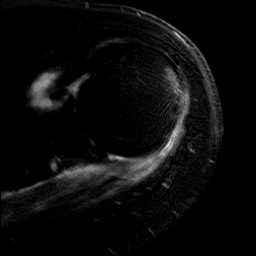
[im 15/21]
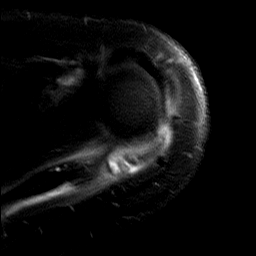
[im 18/21]
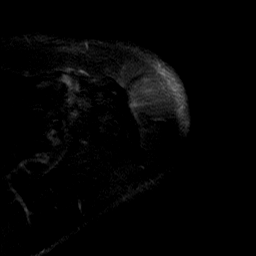
[im 21/21]
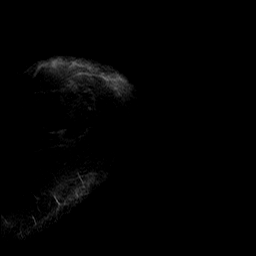

[Series 5: T2 · oblique · 4.0mm · 0.62mm/px · 8 of 19 slices shown (1 of 2)]
[im 1/19]
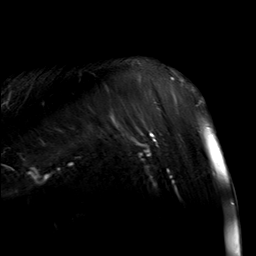
[im 3/19]
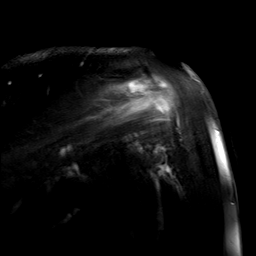
[im 6/19]
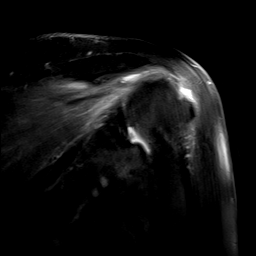
[im 8/19]
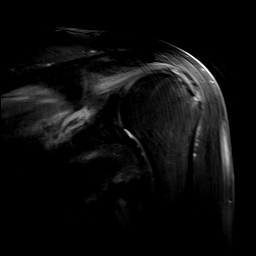
[im 11/19]
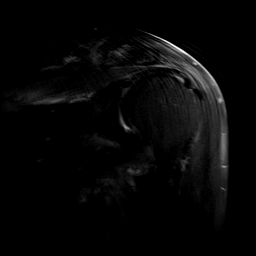
[im 13/19]
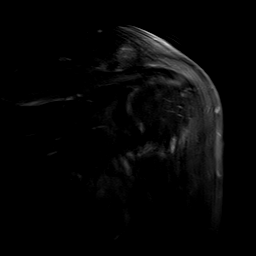
[im 16/19]
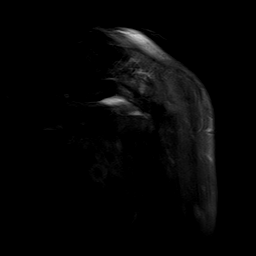
[im 19/19]
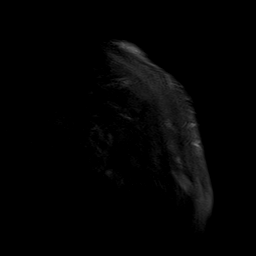

[Series 8: sagittal obl t1_fil_1 · oblique · 4.0mm · 0.62mm/px · 8 of 19 slices shown]
[im 1/19]
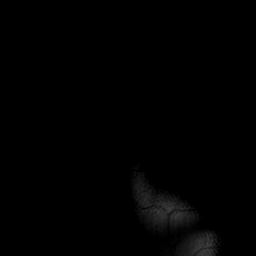
[im 3/19]
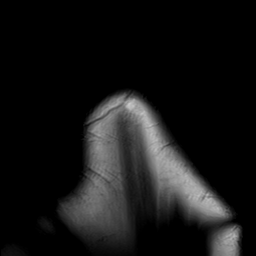
[im 6/19]
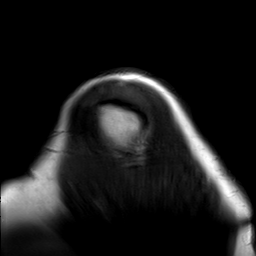
[im 8/19]
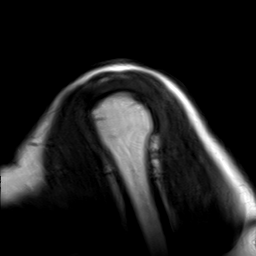
[im 11/19]
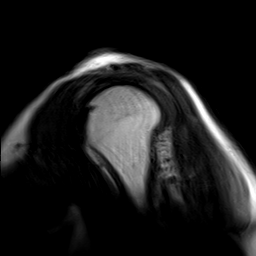
[im 13/19]
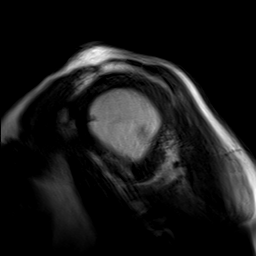
[im 16/19]
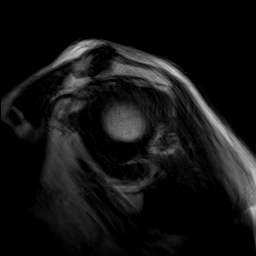
[im 19/19]
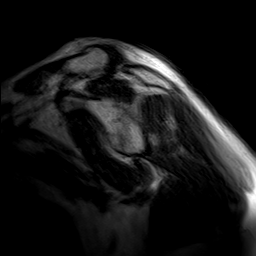

[Series 10: T2 · oblique · 4.0mm · 0.62mm/px · 8 of 19 slices shown (2 of 2)]
[im 1/19]
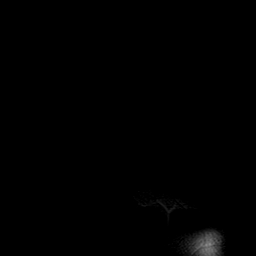
[im 3/19]
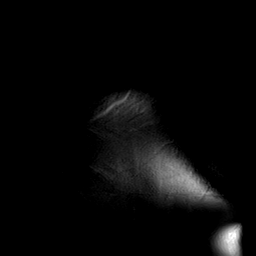
[im 6/19]
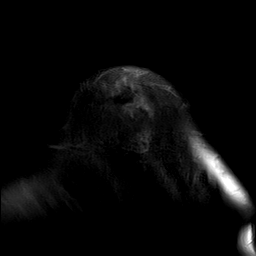
[im 8/19]
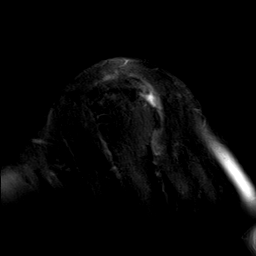
[im 11/19]
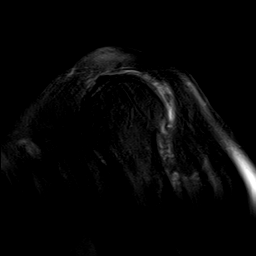
[im 13/19]
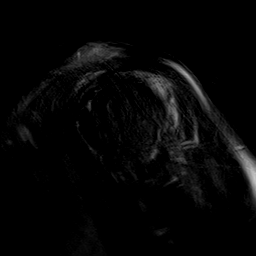
[im 16/19]
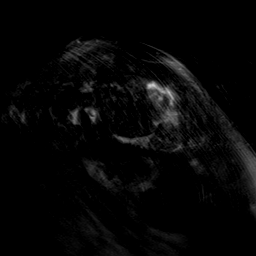
[im 19/19]
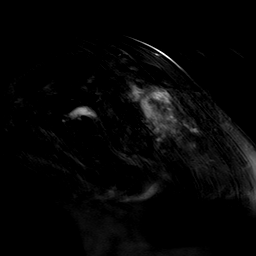

[Series 12: PD · oblique · 4.0mm · 0.62mm/px · 8 of 19 slices shown]
[im 1/19]
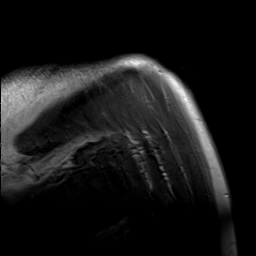
[im 3/19]
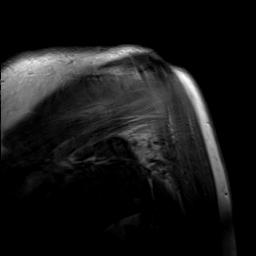
[im 6/19]
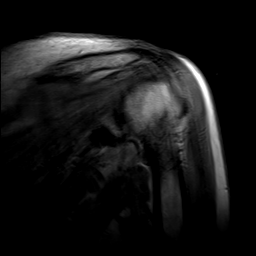
[im 8/19]
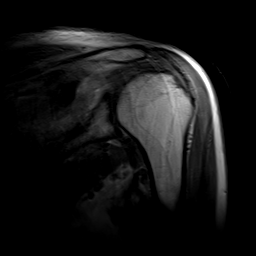
[im 11/19]
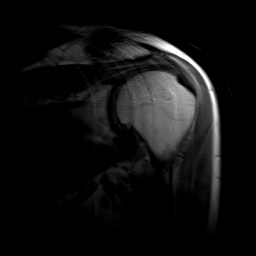
[im 13/19]
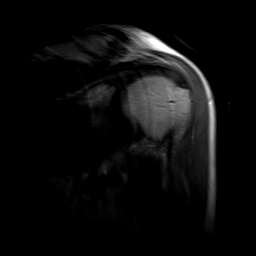
[im 16/19]
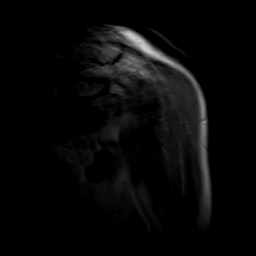
[im 19/19]
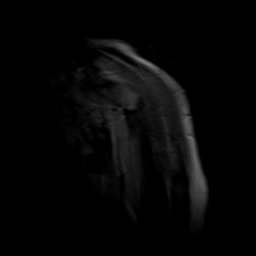

[40 of 40 positions shown; findings below may reference images not displayed]

FINDINGS: Study is motion degraded. Multiple image repeats were performed,
with some improvement. The best images were archives. Patient was
unable the tolerate further imaging.

Rotator cuff: Diffuse attrition/ attenuation of the distal
SUPRASPINATUS tendon. There is no full-thickness tear. Near complete
INFRASPINATUS tendon tear is present. There may be a few superior
fibers intact at the conjoined fibers of SUPRASPINATUS and
INFRASPINATUS. Maximal retraction measures between 2 cm and 3 cm.
The full-thickness component of the tear measures 14 mm (image 8
series 10).

Teres minor is intact.  SUBSCAPULARIS tendon is intact.

Muscles: Moderate teres minor muscular atrophy. Mild INFRASPINATUS
muscular atrophy is present with diffuse edema radiating through
INFRASPINATUS and teres minor. This suggests a more recent injury
with strain.

Biceps long head:  Intact.

Acromioclavicular Joint: Type 2 acromion. Mild AC joint
osteoarthritis.

Glenohumeral Joint: Mild glenohumeral osteoarthritis. Subchondral
cyst present at the 9 o'clock position of the posterior glenoid.

Labrum:  No displaced labral tear or paralabral cyst identified.

Bones:  No fracture or aggressive osseous lesion.
IMPRESSION: 1. Complete or near complete isolated INFRASPINATUS tendon tear with
between 2 cm and 3 cm of retraction. Mild fatty atrophy.
2. Moderate fatty atrophy of teres minor, likely representing remote
denervation injury such as Bigham syndrome or entrapment.
No quadrilateral space region.
3. Moderately motion degraded study.

## 2016-12-11 ENCOUNTER — Encounter: Payer: Self-pay | Admitting: Podiatry

## 2016-12-11 ENCOUNTER — Ambulatory Visit (INDEPENDENT_AMBULATORY_CARE_PROVIDER_SITE_OTHER): Payer: PPO | Admitting: Podiatry

## 2016-12-11 DIAGNOSIS — M7672 Peroneal tendinitis, left leg: Secondary | ICD-10-CM

## 2016-12-14 NOTE — Progress Notes (Signed)
   Subjective:  Patient presents today for follow-up evaluation of peroneal tendinitis of the left foot. He states the pain returned about one week ago. He reports associated pain to the lateral side of the left foot. Taking Meloxicam helps to alleviate the pain. He denies modifying factors. He is here for further evaluation and treatment.   Past Medical History:  Diagnosis Date  . Anxiety   . GERD (gastroesophageal reflux disease)   . Vertigo 06/2014   had an episode in 2013.      Objective/Physical Exam General: The patient is alert and oriented x3 in no acute distress.  Dermatology: Skin is warm, dry and supple bilateral lower extremities. Negative for open lesions or macerations.  Vascular: Palpable pedal pulses bilaterally. No edema or erythema noted. Capillary refill within normal limits.  Neurological: Epicritic and protective threshold grossly intact bilaterally.   Musculoskeletal Exam: Pain on palpation noted to the fifth metatarsal tubercle at the insertion of the peroneal brevis tendon consistent with a peroneal enthesopathy left foot. Pain with palpation to the left ankle joint. Range of motion within normal limits to all pedal and ankle joints bilateral. Muscle strength 5/5 in all groups bilateral.     Assessment: #1 peroneal tendinitis left  Plan of Care:  #1 Patient was evaluated. #2 injection of 0.5 mL of Celestone Soluspan injected into the peroneal tendon sheath. Care was taken to avoid direct injection into the tendons. #3 Continue taking Mobic as prescribed by orthopedist for right knee pain. #4 Continue wearing good shoe gear.  #5 Return to clinic when necessary.  Edrick Kins, DPM [facility]  Dr. Edrick Kins, [address]                                        [place], [place] [address]                Office [phone]  Fax [phone]

## 2016-12-16 MED ORDER — BETAMETHASONE SOD PHOS & ACET 6 (3-3) MG/ML IJ SUSP: 3.0000 mg | Freq: Once | INTRAMUSCULAR | Status: DC

## 2016-12-22 DIAGNOSIS — L570 Actinic keratosis: Secondary | ICD-10-CM | POA: Diagnosis not present

## 2017-01-05 DIAGNOSIS — L570 Actinic keratosis: Secondary | ICD-10-CM | POA: Diagnosis not present

## 2017-01-25 ENCOUNTER — Other Ambulatory Visit: Payer: Self-pay | Admitting: Family Medicine

## 2017-02-04 DIAGNOSIS — M189 Osteoarthritis of first carpometacarpal joint, unspecified: Secondary | ICD-10-CM | POA: Diagnosis not present

## 2017-02-04 DIAGNOSIS — M1711 Unilateral primary osteoarthritis, right knee: Secondary | ICD-10-CM | POA: Diagnosis not present

## 2017-02-17 ENCOUNTER — Ambulatory Visit (INDEPENDENT_AMBULATORY_CARE_PROVIDER_SITE_OTHER): Payer: PPO | Admitting: Family Medicine

## 2017-02-17 ENCOUNTER — Encounter: Payer: Self-pay | Admitting: Family Medicine

## 2017-02-17 VITALS — BP 134/74 | HR 68 | Temp 97.9°F | Resp 16 | Wt 195.8 lb

## 2017-02-17 DIAGNOSIS — J069 Acute upper respiratory infection, unspecified: Secondary | ICD-10-CM | POA: Diagnosis not present

## 2017-02-17 MED ORDER — HYDROCODONE-HOMATROPINE 5-1.5 MG/5ML PO SYRP: ORAL_SOLUTION | ORAL | 0 refills | Status: DC

## 2017-02-17 NOTE — Progress Notes (Signed)
Subjective:     Patient ID: Edward Rodriguez, male   DOB: 1940/07/19, 77 y.o.   MRN: [id number] Chief Complaint  Patient presents with  . Cough    Patient comes in ofice today with complaints of cough and wheezing for one week, patient states that last night he was running a fever and took otc Tylenol for symptom control.   HPI States he developed cold sx for the second time in a month. Reports clear sinus congestion and minimally productive cough. States his fever with a forehead strip was 101 yesterday. Has taken Tylenol today.  Review of Systems     Objective:   Physical Exam  Constitutional: He appears well-developed and well-nourished. No distress.  Ears: T.M's intact without inflammation Throat: tonsils absent with mild posterior pharyngeal erythema Neck: no cervical adenopathy Lungs: clear     Assessment:    1. Viral upper respiratory tract infection - HYDROcodone-homatropine (HYCODAN) 5-1.5 MG/5ML syrup; 5 ml 4-6 hours as needed for cough  Dispense: 120 mL; Refill: 0    Plan:    Discussed use of Mucinex D and Delsym. To call if cough not improving over the next week.

## 2017-02-17 NOTE — Patient Instructions (Addendum)
Discussed use of Mucinex D for congestion and Delsym for cough. Call me if cough not improving over the next week.

## 2017-02-22 DIAGNOSIS — M542 Cervicalgia: Secondary | ICD-10-CM | POA: Diagnosis not present

## 2017-03-02 DIAGNOSIS — M542 Cervicalgia: Secondary | ICD-10-CM | POA: Diagnosis not present

## 2017-03-08 DIAGNOSIS — M542 Cervicalgia: Secondary | ICD-10-CM | POA: Diagnosis not present

## 2017-03-10 DIAGNOSIS — M542 Cervicalgia: Secondary | ICD-10-CM | POA: Diagnosis not present

## 2017-03-15 DIAGNOSIS — M542 Cervicalgia: Secondary | ICD-10-CM | POA: Diagnosis not present

## 2017-03-17 DIAGNOSIS — M542 Cervicalgia: Secondary | ICD-10-CM | POA: Diagnosis not present

## 2017-03-29 ENCOUNTER — Ambulatory Visit (INDEPENDENT_AMBULATORY_CARE_PROVIDER_SITE_OTHER): Payer: PPO | Admitting: Family Medicine

## 2017-03-29 ENCOUNTER — Encounter: Payer: Self-pay | Admitting: Family Medicine

## 2017-03-29 VITALS — BP 118/78 | Temp 98.3°F | Wt 198.0 lb

## 2017-03-29 DIAGNOSIS — R52 Pain, unspecified: Secondary | ICD-10-CM

## 2017-03-29 DIAGNOSIS — M719 Bursopathy, unspecified: Secondary | ICD-10-CM | POA: Insufficient documentation

## 2017-03-29 DIAGNOSIS — R05 Cough: Secondary | ICD-10-CM

## 2017-03-29 DIAGNOSIS — M7550 Bursitis of unspecified shoulder: Secondary | ICD-10-CM

## 2017-03-29 DIAGNOSIS — J101 Influenza due to other identified influenza virus with other respiratory manifestations: Secondary | ICD-10-CM | POA: Diagnosis not present

## 2017-03-29 DIAGNOSIS — R059 Cough, unspecified: Secondary | ICD-10-CM

## 2017-03-29 LAB — POCT INFLUENZA A/B
INFLUENZA B, POC: NEGATIVE
Influenza A, POC: POSITIVE — AB

## 2017-03-29 MED ORDER — OSELTAMIVIR PHOSPHATE 75 MG PO CAPS: 75.0000 mg | ORAL_CAPSULE | Freq: Two times a day (BID) | ORAL | 0 refills | Status: DC

## 2017-03-29 NOTE — Patient Instructions (Signed)

## 2017-03-29 NOTE — Progress Notes (Signed)
Patient: Edward Rodriguez Male    DOB: [birth year]   77 y.o.   MRN: [id number] Visit Date: 03/29/2017  Today's Provider: Vernie Murders, PA   Chief Complaint  Patient presents with  . URI   Subjective:    URI   This is a new problem. Episode onset: Friday. The problem has been gradually worsening. Maximum temperature: 99.0. The fever has been present for 1 to 2 days. Associated symptoms include coughing. Associated symptoms comments: Chills and body aches. Treatments tried: Mucinex. The treatment provided no relief.   Past Medical History:  Diagnosis Date  . Anxiety   . GERD (gastroesophageal reflux disease)   . Vertigo 06/2014   had an episode in 2013.   Past Surgical History:  Procedure Laterality Date  . ROTATOR CUFF REPAIR Left 06/2014  . SHOULDER ARTHROSCOPY Left 06/27/2014   Procedure: ARTHROSCOPY SHOULDER- mini open rotator cuff repair;  Surgeon: Thornton Park, MD;  Location: [facility] ORS;  Service: Orthopedics;  Laterality: Left;  . SHOULDER OPEN ROTATOR CUFF REPAIR Right 2011   Family History  Problem Relation Age of Onset  . Kidney failure Mother        36s  . Dementia Father   . Heart Problems Sister   . Cancer Maternal Grandfather   . Cancer Paternal Grandmother   . Cancer Paternal Grandfather    Allergies  Allergen Reactions  . Sulfa Antibiotics   . Sulfur Rash    Current Outpatient Medications:  .  clindamycin (CLEOCIN T) 1 % external solution, , Disp: , Rfl:  .  diclofenac sodium (VOLTAREN) 1 % GEL, , Disp: , Rfl:  .  doxycycline (VIBRAMYCIN) 50 MG capsule, , Disp: , Rfl:  .  EUCRISA 2 % OINT, , Disp: , Rfl:  .  meloxicam (MOBIC) 15 MG tablet, meloxicam 15 mg tablet  Take 1 tablet every day by oral route., Disp: , Rfl:  .  omeprazole (PRILOSEC) 20 MG capsule, Take 40 mg by mouth every other day., Disp: , Rfl:  .  pramoxine-hydrocortisone (PROCTOCREAM-HC) 1-1 % rectal cream, Place rectally 3 (three) times daily. As needed for hemorrhoids, Disp: 30 g, Rfl:  2 .  sildenafil (REVATIO) 20 MG tablet, TAKE 2-5 TABLETS BY MOUTH ONCE DAILY AS NEEDED FOR E.D., Disp: 50 tablet, Rfl: 5 .  venlafaxine XR (EFFEXOR-XR) 150 MG 24 hr capsule, TAKE 1 CAPSULE BY MOUTH ONCE DAILY IN THE MORNING, Disp: 90 capsule, Rfl: 1  Review of Systems  Constitutional: Negative.   Respiratory: Positive for cough.   Cardiovascular: Negative.    Social History   Tobacco Use  . Smoking status: Former Smoker    Last attempt to quit: 06/18/1968    Years since quitting: 48.8  . Smokeless tobacco: Never Used  Substance Use Topics  . Alcohol use: Yes    Alcohol/week: 2.4 oz    Types: 4 Cans of beer per week    Comment: occasionally   Objective:   BP 118/78 (BP Location: Right Arm, Patient Position: Sitting, Cuff Size: Normal)   Temp 98.3 F (36.8 C) (Oral)   Wt 198 lb (89.8 kg)   BMI 27.81 kg/m   Physical Exam  Constitutional: He is oriented to person, place, and time. He appears well-developed and well-nourished. No distress.  HENT:  Head: Normocephalic and atraumatic.  Right Ear: Hearing and external ear normal.  Left Ear: Hearing and external ear normal.  Nose: Nose normal.  Mouth/Throat: Oropharynx is clear and moist.  Eyes: Conjunctivae and  lids are normal. Right eye exhibits no discharge. Left eye exhibits no discharge. No scleral icterus.  Neck: Neck supple.  Cardiovascular: Normal rate and regular rhythm.  Pulmonary/Chest: Effort normal. No respiratory distress.  Abdominal: Soft. Bowel sounds are normal.  Musculoskeletal: Normal range of motion.  Neurological: He is alert and oriented to person, place, and time.  Skin: Skin is intact. No lesion and no rash noted.  Psychiatric: He has a normal mood and affect. His speech is normal and behavior is normal. Thought content normal.      Assessment & Plan:     1. Body aches Onset 3-4 days ago but no fever documented. General malaise with body aches and cough. Positive flu test. - POCT Influenza A/B  2.  Cough Onset 3-4 days ago. No sputum productions. Denies sore throat or fever. Test positive for influenza A. - POCT Influenza A/B  3. Influenza A Cough and general malaise with positive flu test for influenza A. May use Tylenol prn with increased fluid intake and Mucinex-DM or Delsym for cough. Home to rest and treat with Tamiflu. Recheck if no better in 5-7 days. - oseltamivir (TAMIFLU) 75 MG capsule; Take 1 capsule (75 mg total) by mouth 2 (two) times daily.  Dispense: 10 capsule; Refill: [place]  [facility]

## 2017-04-01 ENCOUNTER — Telehealth: Payer: Self-pay | Admitting: Family Medicine

## 2017-04-01 NOTE — Telephone Encounter (Signed)
Patient advised. He verbalized understanding.  

## 2017-04-01 NOTE — Telephone Encounter (Signed)
Patient dx w/ flu on 03/29/17 and given Tamiflu.  He will take his last dose tomorrow. Wants to know if he needs to come back in for a recheck.  He said he has no Temperature, no body aches but has somewhat of a cough left and he is not quite "back to himself".  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

## 2017-04-01 NOTE — Telephone Encounter (Signed)
Sounds as if he is progressing as expected. Drink plenty of fluids and watch for return of fevers. No need for recheck unless that happens.

## 2017-04-08 DIAGNOSIS — L821 Other seborrheic keratosis: Secondary | ICD-10-CM | POA: Diagnosis not present

## 2017-04-08 DIAGNOSIS — L719 Rosacea, unspecified: Secondary | ICD-10-CM | POA: Diagnosis not present

## 2017-04-08 DIAGNOSIS — L578 Other skin changes due to chronic exposure to nonionizing radiation: Secondary | ICD-10-CM | POA: Diagnosis not present

## 2017-04-08 DIAGNOSIS — L570 Actinic keratosis: Secondary | ICD-10-CM | POA: Diagnosis not present

## 2017-04-08 DIAGNOSIS — L820 Inflamed seborrheic keratosis: Secondary | ICD-10-CM | POA: Diagnosis not present

## 2017-04-08 DIAGNOSIS — L300 Nummular dermatitis: Secondary | ICD-10-CM | POA: Diagnosis not present

## 2017-05-02 ENCOUNTER — Other Ambulatory Visit: Payer: Self-pay | Admitting: Family Medicine

## 2017-05-10 ENCOUNTER — Ambulatory Visit (INDEPENDENT_AMBULATORY_CARE_PROVIDER_SITE_OTHER): Payer: PPO | Admitting: Family Medicine

## 2017-05-10 ENCOUNTER — Encounter: Payer: Self-pay | Admitting: Family Medicine

## 2017-05-10 VITALS — BP 138/72 | HR 70 | Temp 98.1°F | Resp 16 | Wt 198.0 lb

## 2017-05-10 DIAGNOSIS — J301 Allergic rhinitis due to pollen: Secondary | ICD-10-CM | POA: Diagnosis not present

## 2017-05-10 DIAGNOSIS — M542 Cervicalgia: Secondary | ICD-10-CM | POA: Diagnosis not present

## 2017-05-10 DIAGNOSIS — M189 Osteoarthritis of first carpometacarpal joint, unspecified: Secondary | ICD-10-CM | POA: Diagnosis not present

## 2017-05-10 DIAGNOSIS — R058 Other specified cough: Secondary | ICD-10-CM

## 2017-05-10 DIAGNOSIS — R05 Cough: Secondary | ICD-10-CM

## 2017-05-10 MED ORDER — PREDNISONE 20 MG PO TABS: ORAL_TABLET | ORAL | 0 refills | Status: DC

## 2017-05-10 NOTE — Progress Notes (Signed)
Subjective:     Patient ID: Edward Rodriguez, male   DOB: 03-19-1940, 77 y.o.   MRN: [id number] Chief Complaint  Patient presents with  . Wheezing    Has been going on for months; (Worsening since having the flu)    HPI States he also develops allergy sx seasonally and has resumed omeprazole daily.  Review of Systems     Objective:   Physical Exam  Constitutional: He appears well-developed and well-nourished. No distress.  Cardiovascular: Normal rate.  Pulmonary/Chest: Breath sounds normal. He has no wheezes.       Assessment:    1. Seasonal allergic rhinitis due to pollen  2. Post-viral cough syndrome - predniSONE (DELTASONE) 20 MG tablet; One pill twice daily for 5 days  Dispense: 10 tablet; Refill: 0    Plan:    Start Claritin with phone f/u once he finishes the prednisone.

## 2017-05-10 NOTE — Patient Instructions (Addendum)
Discussed starting Claritin for allergies. Let me know how you are doing after you finish the prednisone.

## 2017-05-17 ENCOUNTER — Telehealth: Payer: Self-pay | Admitting: Family Medicine

## 2017-05-17 NOTE — Telephone Encounter (Signed)
Patient called to update you.  He said the Prednisone has made the congestion a lot better.   He is still struggling with allergies but feels better.

## 2017-05-24 DIAGNOSIS — M542 Cervicalgia: Secondary | ICD-10-CM | POA: Diagnosis not present

## 2017-06-01 DIAGNOSIS — M542 Cervicalgia: Secondary | ICD-10-CM | POA: Diagnosis not present

## 2017-06-03 DIAGNOSIS — M542 Cervicalgia: Secondary | ICD-10-CM | POA: Diagnosis not present

## 2017-06-07 DIAGNOSIS — M542 Cervicalgia: Secondary | ICD-10-CM | POA: Diagnosis not present

## 2017-06-10 DIAGNOSIS — M542 Cervicalgia: Secondary | ICD-10-CM | POA: Diagnosis not present

## 2017-06-15 DIAGNOSIS — M542 Cervicalgia: Secondary | ICD-10-CM | POA: Diagnosis not present

## 2017-06-17 DIAGNOSIS — M542 Cervicalgia: Secondary | ICD-10-CM | POA: Diagnosis not present

## 2017-06-18 ENCOUNTER — Ambulatory Visit: Payer: PPO | Admitting: Podiatry

## 2017-06-22 ENCOUNTER — Encounter: Payer: Self-pay | Admitting: Family Medicine

## 2017-06-22 ENCOUNTER — Ambulatory Visit (INDEPENDENT_AMBULATORY_CARE_PROVIDER_SITE_OTHER): Payer: PPO | Admitting: Family Medicine

## 2017-06-22 VITALS — BP 114/72 | HR 72 | Temp 98.0°F | Resp 16 | Wt 198.0 lb

## 2017-06-22 DIAGNOSIS — J069 Acute upper respiratory infection, unspecified: Secondary | ICD-10-CM | POA: Diagnosis not present

## 2017-06-22 NOTE — Progress Notes (Signed)
  Subjective:     Patient ID: Edward Rodriguez, male   DOB: December 18, 1940, 77 y.o.   MRN: [id number] Chief Complaint  Patient presents with  . Cough    Pt reports that he started feeling bad about 3 days ago. productive cough with yellow/green sputum. he has some head congestion but mostly in his chest. He reports that he thinks it started with his allergies. No fevers, chills, body aches. No increased shortness of breath.     HPI Additionally reports transient sore throat with PND and accompanying cough. 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 He has started a multi-cold sx product and continued on steroid nasal spray. Feels his allergies were under control before symptom onset.  Review of Systems     Objective:   Physical Exam  Constitutional: He appears well-developed and well-nourished. No distress.  Ears: T.M's intact without inflammation Throat: tonsils absent without posterior pharyngeal erythema Neck: no cervical adenopathy Lungs: clear     Assessment:    1. Viral upper respiratory tract infection     Plan:    Discontinued symptomatic treatment. To cal if not improving over the course of the week.

## 2017-06-22 NOTE — Patient Instructions (Signed)
Continue your current multi-symptom cold medication. Expect your sinuses and drainage to improve over the course of the week. Call if sinuses not improving by the end of the week.

## 2017-06-24 DIAGNOSIS — M542 Cervicalgia: Secondary | ICD-10-CM | POA: Diagnosis not present

## 2017-06-29 ENCOUNTER — Ambulatory Visit: Payer: PPO | Admitting: Podiatry

## 2017-07-02 ENCOUNTER — Ambulatory Visit: Payer: PPO | Admitting: Podiatry

## 2017-07-02 DIAGNOSIS — M542 Cervicalgia: Secondary | ICD-10-CM | POA: Diagnosis not present

## 2017-07-09 DIAGNOSIS — M542 Cervicalgia: Secondary | ICD-10-CM | POA: Diagnosis not present

## 2017-07-13 ENCOUNTER — Ambulatory Visit: Payer: PPO | Admitting: Podiatry

## 2017-07-13 ENCOUNTER — Encounter: Payer: Self-pay | Admitting: Podiatry

## 2017-07-13 DIAGNOSIS — M722 Plantar fascial fibromatosis: Secondary | ICD-10-CM | POA: Diagnosis not present

## 2017-07-13 DIAGNOSIS — M542 Cervicalgia: Secondary | ICD-10-CM | POA: Diagnosis not present

## 2017-07-13 MED ORDER — MELOXICAM 15 MG PO TABS: 15.0000 mg | ORAL_TABLET | Freq: Every day | ORAL | 1 refills | Status: AC

## 2017-07-13 NOTE — Progress Notes (Signed)
   Subjective: 77 year old male presenting today with a chief complaint of a plantar fasciitis flare of the left foot and heel that began 2 months ago. He reports sharp pain that is worse after standing from a sitting position. He has been icing the area with some relief. Patient is here for further evaluation and treatment.   Past Medical History:  Diagnosis Date  . Anxiety   . GERD (gastroesophageal reflux disease)   . Vertigo 06/2014   had an episode in 2013.     Objective: Physical Exam General: The patient is alert and oriented x3 in no acute distress.  Dermatology: Skin is warm, dry and supple bilateral lower extremities. Negative for open lesions or macerations bilateral.   Vascular: Dorsalis Pedis and Posterior Tibial pulses palpable bilateral.  Capillary fill time is immediate to all digits.  Neurological: Epicritic and protective threshold intact bilateral.   Musculoskeletal: Tenderness to palpation to the plantar aspect of the left heel along the plantar fascia. All other joints range of motion within normal limits bilateral. Strength 5/5 in all groups bilateral.   Assessment: 1. Plantar fasciitis left foot - lateral band   Plan of Care:  1. Patient evaluated. 2. Injection of 0.5cc Celestone soluspan injected into the left plantar fascia.  3. Rx for Meloxicam provided to patient.  4. Plantar fascial band(s) dispensed  5. Instructed patient regarding therapies and modalities at home to alleviate symptoms.  6. Return to clinic in 4 weeks.     Edrick Kins, DPM [facility]  Dr. Edrick Kins, DPM    [address], [place] [address]                Office [phone]  Fax [phone]

## 2017-07-15 DIAGNOSIS — M542 Cervicalgia: Secondary | ICD-10-CM | POA: Diagnosis not present

## 2017-08-03 DIAGNOSIS — Z961 Presence of intraocular lens: Secondary | ICD-10-CM | POA: Diagnosis not present

## 2017-08-03 DIAGNOSIS — H43393 Other vitreous opacities, bilateral: Secondary | ICD-10-CM | POA: Diagnosis not present

## 2017-08-08 ENCOUNTER — Other Ambulatory Visit: Payer: Self-pay | Admitting: Family Medicine

## 2017-08-31 ENCOUNTER — Encounter: Payer: Self-pay | Admitting: Family Medicine

## 2017-08-31 ENCOUNTER — Ambulatory Visit (INDEPENDENT_AMBULATORY_CARE_PROVIDER_SITE_OTHER): Payer: PPO | Admitting: Family Medicine

## 2017-08-31 VITALS — BP 120/80 | HR 74 | Temp 97.9°F | Resp 16 | Wt 193.8 lb

## 2017-08-31 DIAGNOSIS — S81811A Laceration without foreign body, right lower leg, initial encounter: Secondary | ICD-10-CM

## 2017-08-31 DIAGNOSIS — Z23 Encounter for immunization: Secondary | ICD-10-CM | POA: Diagnosis not present

## 2017-08-31 MED ORDER — SODIUM CHLORIDE 0.9 % EX SOLN: 2.0000 "application " | Freq: Two times a day (BID) | CUTANEOUS | 0 refills | Status: DC

## 2017-08-31 NOTE — Patient Instructions (Signed)
Cleanse your wound twice daily with saline solution and apply dressing. The wound will heal from the inside outwards over time.

## 2017-08-31 NOTE — Addendum Note (Signed)
Addended by: Minette Headland on: 08/31/2017 12:48 PM   Modules accepted: Orders

## 2017-08-31 NOTE — Progress Notes (Signed)
  Subjective:     Patient ID: Long Brimage, male   DOB: 01-22-1941, 77 y.o.   MRN: [id number] Chief Complaint  Patient presents with  . 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 Yesterday patient reports that he accidently bumped leg and states that wound started bleeding again and is sore and swelling.    HPI States he had been using peroxide and abx ointment with little improvement. Denies purulent drainage.  Review of Systems     Objective:   Physical Exam  Constitutional: He appears well-developed and well-nourished. No distress.  Skin:  Right lower extremity with stellar laceration. No purulent drainage or significant erythema noted       Assessment:    1. Leg laceration, right, initial encounter  2. Need for tetanus booster: Td today    Plan:    Discussed use of twice daily saline cleansings and dressing.

## 2017-09-03 DIAGNOSIS — M1812 Unilateral primary osteoarthritis of first carpometacarpal joint, left hand: Secondary | ICD-10-CM | POA: Diagnosis not present

## 2017-09-03 DIAGNOSIS — M1811 Unilateral primary osteoarthritis of first carpometacarpal joint, right hand: Secondary | ICD-10-CM | POA: Diagnosis not present

## 2017-09-09 DIAGNOSIS — L570 Actinic keratosis: Secondary | ICD-10-CM | POA: Diagnosis not present

## 2017-09-09 DIAGNOSIS — L578 Other skin changes due to chronic exposure to nonionizing radiation: Secondary | ICD-10-CM | POA: Diagnosis not present

## 2017-09-09 DIAGNOSIS — D1800 Hemangioma unspecified site: Secondary | ICD-10-CM | POA: Diagnosis not present

## 2017-09-09 DIAGNOSIS — D692 Other nonthrombocytopenic purpura: Secondary | ICD-10-CM | POA: Diagnosis not present

## 2017-09-09 DIAGNOSIS — L820 Inflamed seborrheic keratosis: Secondary | ICD-10-CM | POA: Diagnosis not present

## 2017-10-31 ENCOUNTER — Other Ambulatory Visit: Payer: Self-pay | Admitting: Family Medicine

## 2017-11-01 NOTE — Telephone Encounter (Signed)
Patient of [facility] he has been seen in office since last refill but it was for an acute matter. Can you please review. Thanks. KW

## 2017-12-21 DIAGNOSIS — M25551 Pain in right hip: Secondary | ICD-10-CM | POA: Diagnosis not present

## 2017-12-21 DIAGNOSIS — M25512 Pain in left shoulder: Secondary | ICD-10-CM | POA: Diagnosis not present

## 2018-02-06 ENCOUNTER — Other Ambulatory Visit: Payer: Self-pay | Admitting: Family Medicine

## 2018-02-11 ENCOUNTER — Other Ambulatory Visit: Payer: Self-pay

## 2018-02-11 ENCOUNTER — Ambulatory Visit (INDEPENDENT_AMBULATORY_CARE_PROVIDER_SITE_OTHER): Payer: PPO | Admitting: Family Medicine

## 2018-02-11 ENCOUNTER — Encounter: Payer: Self-pay | Admitting: Family Medicine

## 2018-02-11 VITALS — BP 140/80 | HR 76 | Temp 97.5°F | Ht 71.0 in | Wt 199.2 lb

## 2018-02-11 DIAGNOSIS — S80812A Abrasion, left lower leg, initial encounter: Secondary | ICD-10-CM

## 2018-02-11 NOTE — Progress Notes (Signed)
  Subjective:     Patient ID: Edward Rodriguez, male   DOB: [birth year], 78 y.o.   MRN: [id number] Chief Complaint  Patient presents with  . 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 Initially was using peroxide and bandaid but then started leaving it open to the air.   Review of Systems     Objective:   Physical Exam Constitutional:      General: He is not in acute distress.    Appearance: He is not ill-appearing.  Skin:    Comments: Left lateral lower leg with open abrasion/laceration. Mild inflammatory flare but no drainage.  Neurological:     Mental Status: He is alert.        Assessment:    1. Abrasion of left lower extremity, initial encounter: cleansed with normal saline and dressing applied.    Plan:    Continue saline cleansings/dressings to heal secondarily.

## 2018-02-11 NOTE — Patient Instructions (Signed)
Discussed use of saline cleansing and apply bandaid.

## 2018-02-17 DIAGNOSIS — M25551 Pain in right hip: Secondary | ICD-10-CM | POA: Diagnosis not present

## 2018-02-17 DIAGNOSIS — M180 Bilateral primary osteoarthritis of first carpometacarpal joints: Secondary | ICD-10-CM | POA: Diagnosis not present

## 2018-02-25 ENCOUNTER — Other Ambulatory Visit: Payer: Self-pay | Admitting: Family Medicine

## 2018-03-02 DIAGNOSIS — M25551 Pain in right hip: Secondary | ICD-10-CM | POA: Diagnosis not present

## 2018-03-16 DIAGNOSIS — L299 Pruritus, unspecified: Secondary | ICD-10-CM | POA: Diagnosis not present

## 2018-03-16 DIAGNOSIS — Z1283 Encounter for screening for malignant neoplasm of skin: Secondary | ICD-10-CM | POA: Diagnosis not present

## 2018-03-16 DIAGNOSIS — D225 Melanocytic nevi of trunk: Secondary | ICD-10-CM | POA: Diagnosis not present

## 2018-03-16 DIAGNOSIS — L821 Other seborrheic keratosis: Secondary | ICD-10-CM | POA: Diagnosis not present

## 2018-03-16 DIAGNOSIS — D2230 Melanocytic nevi of unspecified part of face: Secondary | ICD-10-CM | POA: Diagnosis not present

## 2018-03-16 DIAGNOSIS — D692 Other nonthrombocytopenic purpura: Secondary | ICD-10-CM | POA: Diagnosis not present

## 2018-03-16 DIAGNOSIS — L290 Pruritus ani: Secondary | ICD-10-CM | POA: Diagnosis not present

## 2018-03-16 DIAGNOSIS — D229 Melanocytic nevi, unspecified: Secondary | ICD-10-CM | POA: Diagnosis not present

## 2018-03-16 DIAGNOSIS — L570 Actinic keratosis: Secondary | ICD-10-CM | POA: Diagnosis not present

## 2018-03-23 DIAGNOSIS — M25552 Pain in left hip: Secondary | ICD-10-CM | POA: Diagnosis not present

## 2018-04-06 ENCOUNTER — Ambulatory Visit (INDEPENDENT_AMBULATORY_CARE_PROVIDER_SITE_OTHER): Payer: PPO | Admitting: Family Medicine

## 2018-04-06 ENCOUNTER — Encounter: Payer: Self-pay | Admitting: Family Medicine

## 2018-04-06 VITALS — BP 124/76 | HR 68 | Temp 97.8°F | Resp 16 | Wt 198.6 lb

## 2018-04-06 DIAGNOSIS — N3090 Cystitis, unspecified without hematuria: Secondary | ICD-10-CM | POA: Diagnosis not present

## 2018-04-06 DIAGNOSIS — R319 Hematuria, unspecified: Secondary | ICD-10-CM

## 2018-04-06 DIAGNOSIS — N390 Urinary tract infection, site not specified: Secondary | ICD-10-CM

## 2018-04-06 LAB — POCT URINALYSIS DIPSTICK
BILIRUBIN UA: NEGATIVE
GLUCOSE UA: NEGATIVE
Ketones, UA: NEGATIVE
Nitrite, UA: NEGATIVE
Protein, UA: POSITIVE — AB
Urobilinogen, UA: 0.2 E.U./dL
pH, UA: 6 (ref 5.0–8.0)

## 2018-04-06 MED ORDER — CEPHALEXIN 500 MG PO CAPS: 500.0000 mg | ORAL_CAPSULE | Freq: Two times a day (BID) | ORAL | 0 refills | Status: DC

## 2018-04-06 NOTE — Patient Instructions (Signed)
We will call you with the urine culture results. Get the PSA drawn in one month after you are better.

## 2018-04-06 NOTE — Progress Notes (Signed)
  Subjective:     Patient ID: Edward Rodriguez, male   DOB: 15-Aug-1940, 78 y.o.   MRN: [id number] Chief Complaint  Patient presents with  . Dysuria    Patient comes in office today with complaints of dysuria, frequency of urination and incontinence for the past 2 weeks. Patient reports that he has been taking otc phenazopyridine 97.5mg    HPI States he has never had a UTI or kidney stones. Last PSA WNL in 2017. Reports prior to his current sx nocturia x 1-2. No fever or chills. Has been receiving hip joint steroid injections per orthopedics.. Review of Systems     Objective:   Physical Exam Constitutional:      General: He is not in acute distress.    Appearance: He is not ill-appearing.  Abdominal:     Tenderness: There is no abdominal tenderness.  Genitourinary:    Comments: No cva tenderness Neurological:     Mental Status: He is alert.        Assessment:    1. Urinary tract infection with hematuria, site unspecified - Urine Culture - POCT urinalysis dipstick - cephALEXin (KEFLEX) 500 MG capsule; Take 1 capsule (500 mg total) by mouth 2 (two) times daily.  Dispense: 14 capsule; Refill: 0 - PSA    Plan:    Further f/u pending urine culture result. Draw PSA in one month after getting better.

## 2018-04-09 LAB — URINE CULTURE

## 2018-04-22 ENCOUNTER — Ambulatory Visit: Payer: PPO | Admitting: Physician Assistant

## 2018-04-22 NOTE — Progress Notes (Deleted)
       Patient: Edward Rodriguez Male    DOB: 06-16-1940   78 y.o.   MRN: [id number] Visit Date: 04/22/2018  Today's Provider: Mar Daring, PA-C   No chief complaint on file.  Subjective:    I, Johnnie Moten S. Hendrix Yurkovich, CMA, am acting as a Education administrator for [employer], PA-C.   HPI  Allergies  Allergen Reactions  . Sulfa Antibiotics   . Sulfur Rash     Current Outpatient Medications:  .  cephALEXin (KEFLEX) 500 MG capsule, Take 1 capsule (500 mg total) by mouth 2 (two) times daily., Disp: 14 capsule, Rfl: 0 .  clindamycin (CLEOCIN T) 1 % external solution, , Disp: , Rfl:  .  omeprazole (PRILOSEC) 20 MG capsule, Take 40 mg by mouth every other day., Disp: , Rfl:  .  pramoxine-hydrocortisone (PROCTOCREAM-HC) 1-1 % rectal cream, Place rectally 3 (three) times daily. As needed for hemorrhoids, Disp: 30 g, Rfl: 2 .  sildenafil (REVATIO) 20 MG tablet, TAKE 2-5 TABLETS BY MOUTH ONCE DAILY AS NEEDED FOR ERECTILE DYSFUNCTION, Disp: 50 tablet, Rfl: 5 .  SODIUM CHLORIDE, EXTERNAL, ([facility]S SALINE WOUND [place]) 0.9 % SOLN, Apply 2 application topically 2 (two) times daily., Disp: 210 mL, Rfl: 0 .  venlafaxine XR (EFFEXOR-XR) 150 MG 24 hr capsule, TAKE 1 CAPSULE BY MOUTH IN THE MORNING *TIME FOR OFFICE VISIT*, Disp: 90 capsule, Rfl: 0  Current Facility-Administered Medications:  .  betamethasone acetate-betamethasone sodium phosphate (CELESTONE) injection 3 mg, 3 mg, Intramuscular, Once, Evans, Brent M, DPM .  betamethasone acetate-betamethasone sodium phosphate (CELESTONE) injection 3 mg, 3 mg, Intramuscular, Once, Evans, Brent M, DPM .  betamethasone acetate-betamethasone sodium phosphate (CELESTONE) injection 3 mg, 3 mg, Intramuscular, Once, Evans, Brent M, DPM .  betamethasone acetate-betamethasone sodium phosphate (CELESTONE) injection 3 mg, 3 mg, Intramuscular, Once, Evans, Brent M, DPM .  betamethasone acetate-betamethasone sodium phosphate (CELESTONE) injection 3 mg, 3 mg, Intramuscular, Once,  Evans, Brent M, DPM .  betamethasone acetate-betamethasone sodium phosphate (CELESTONE) injection 3 mg, 3 mg, Intramuscular, Once, Edrick Kins, DPM  Review of Systems  Social History   Tobacco Use  . Smoking status: Former Smoker    Last attempt to quit: 06/18/1968    Years since quitting: 49.8  . Smokeless tobacco: Never Used  Substance Use Topics  . Alcohol use: Yes    Alcohol/week: 4.0 standard drinks    Types: 4 Cans of beer per week    Comment: occasionally      Objective:   There were no vitals taken for this visit. There were no vitals filed for this visit.   Physical Exam      Assessment & [place], PA-C  [facility]

## 2018-04-25 ENCOUNTER — Other Ambulatory Visit: Payer: Self-pay

## 2018-04-25 ENCOUNTER — Ambulatory Visit (INDEPENDENT_AMBULATORY_CARE_PROVIDER_SITE_OTHER): Payer: PPO | Admitting: Physician Assistant

## 2018-04-25 ENCOUNTER — Encounter: Payer: Self-pay | Admitting: Physician Assistant

## 2018-04-25 ENCOUNTER — Other Ambulatory Visit: Payer: Self-pay | Admitting: Family Medicine

## 2018-04-25 DIAGNOSIS — N390 Urinary tract infection, site not specified: Secondary | ICD-10-CM

## 2018-04-25 DIAGNOSIS — R319 Hematuria, unspecified: Secondary | ICD-10-CM | POA: Diagnosis not present

## 2018-04-25 DIAGNOSIS — S80212A Abrasion, left knee, initial encounter: Secondary | ICD-10-CM

## 2018-04-25 MED ORDER — CEPHALEXIN 500 MG PO CAPS: 500.0000 mg | ORAL_CAPSULE | Freq: Two times a day (BID) | ORAL | 0 refills | Status: DC

## 2018-04-25 MED ORDER — VENLAFAXINE HCL ER 150 MG PO CP24: ORAL_CAPSULE | ORAL | 1 refills | Status: DC

## 2018-04-25 NOTE — Telephone Encounter (Signed)
[facility] faxed refill request for the following medications:  venlafaxine XR (EFFEXOR-XR) 150 MG 24 hr capsule   Please advise. 
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
## 2018-05-09 ENCOUNTER — Other Ambulatory Visit: Payer: Self-pay

## 2018-05-09 ENCOUNTER — Encounter: Payer: Self-pay | Admitting: Physician Assistant

## 2018-05-09 ENCOUNTER — Ambulatory Visit (INDEPENDENT_AMBULATORY_CARE_PROVIDER_SITE_OTHER): Payer: PPO | Admitting: Physician Assistant

## 2018-05-09 ENCOUNTER — Other Ambulatory Visit: Payer: Self-pay | Admitting: Physician Assistant

## 2018-05-09 VITALS — BP 129/74 | HR 79 | Temp 97.5°F | Resp 16 | Wt 195.2 lb

## 2018-05-09 DIAGNOSIS — I10 Essential (primary) hypertension: Secondary | ICD-10-CM | POA: Diagnosis not present

## 2018-05-09 DIAGNOSIS — R350 Frequency of micturition: Secondary | ICD-10-CM | POA: Diagnosis not present

## 2018-05-09 DIAGNOSIS — Z0000 Encounter for general adult medical examination without abnormal findings: Secondary | ICD-10-CM | POA: Diagnosis not present

## 2018-05-09 DIAGNOSIS — N138 Other obstructive and reflux uropathy: Secondary | ICD-10-CM | POA: Diagnosis not present

## 2018-05-09 DIAGNOSIS — N401 Enlarged prostate with lower urinary tract symptoms: Secondary | ICD-10-CM | POA: Diagnosis not present

## 2018-05-09 DIAGNOSIS — H2513 Age-related nuclear cataract, bilateral: Secondary | ICD-10-CM | POA: Diagnosis not present

## 2018-05-09 DIAGNOSIS — R300 Dysuria: Secondary | ICD-10-CM

## 2018-05-09 DIAGNOSIS — H401131 Primary open-angle glaucoma, bilateral, mild stage: Secondary | ICD-10-CM | POA: Diagnosis not present

## 2018-05-09 DIAGNOSIS — C189 Malignant neoplasm of colon, unspecified: Secondary | ICD-10-CM | POA: Diagnosis not present

## 2018-05-09 DIAGNOSIS — N3000 Acute cystitis without hematuria: Secondary | ICD-10-CM

## 2018-05-09 DIAGNOSIS — H35371 Puckering of macula, right eye: Secondary | ICD-10-CM | POA: Diagnosis not present

## 2018-05-09 LAB — POCT URINALYSIS DIPSTICK
BILIRUBIN UA: NEGATIVE
Glucose, UA: NEGATIVE
Ketones, UA: NEGATIVE
Nitrite, UA: NEGATIVE
Protein, UA: POSITIVE — AB
Spec Grav, UA: 1.025 (ref 1.010–1.025)
UROBILINOGEN UA: 0.2 U/dL
pH, UA: 6.5 (ref 5.0–8.0)

## 2018-05-09 MED ORDER — AMOXICILLIN-POT CLAVULANATE 875-125 MG PO TABS: 1.0000 | ORAL_TABLET | Freq: Two times a day (BID) | ORAL | 0 refills | Status: DC

## 2018-05-09 NOTE — Patient Instructions (Signed)

## 2018-05-09 NOTE — Progress Notes (Signed)
Patient: Edward Rodriguez Male    DOB: [birth year]   78 y.o.   MRN: [id number] Visit Date: 05/09/2018  Today's Provider: Mar Daring, PA-C   Chief Complaint  Patient presents with  . Dysuria   Subjective:     Dysuria   This is a recurrent problem. The current episode started 1 to 4 weeks ago. The problem has been gradually improving. The quality of the pain is described as burning. There has been no fever. Associated symptoms include frequency ("a little") and urgency ("a little"). Pertinent negatives include no discharge, flank pain or hematuria. He has tried antibiotics (finished with the antibiotic Keflex) for the symptoms.    Allergies  Allergen Reactions  . Sulfa Antibiotics   . Sulfur Rash     Current Outpatient Medications:  .  meloxicam (MOBIC) 15 MG tablet, , Disp: , Rfl:  .  omeprazole (PRILOSEC) 20 MG capsule, Take 40 mg by mouth every other day., Disp: , Rfl:  .  pramoxine-hydrocortisone (PROCTOCREAM-HC) 1-1 % rectal cream, Place rectally 3 (three) times daily. As needed for hemorrhoids, Disp: 30 g, Rfl: 2 .  sildenafil (REVATIO) 20 MG tablet, TAKE 2-5 TABLETS BY MOUTH ONCE DAILY AS NEEDED FOR ERECTILE DYSFUNCTION, Disp: 50 tablet, Rfl: 5 .  SODIUM CHLORIDE, EXTERNAL, ([facility]S SALINE WOUND [place]) 0.9 % SOLN, Apply 2 application topically 2 (two) times daily., Disp: 210 mL, Rfl: 0 .  venlafaxine XR (EFFEXOR-XR) 150 MG 24 hr capsule, TAKE 1 CAPSULE BY MOUTH IN THE MORNING *TIME FOR OFFICE VISIT*, Disp: 90 capsule, Rfl: 1 .  clindamycin (CLEOCIN T) 1 % external solution, , Disp: , Rfl:   Current Facility-Administered Medications:  .  betamethasone acetate-betamethasone sodium phosphate (CELESTONE) injection 3 mg, 3 mg, Intramuscular, Once, Evans, Brent M, DPM .  betamethasone acetate-betamethasone sodium phosphate (CELESTONE) injection 3 mg, 3 mg, Intramuscular, Once, Evans, Brent M, DPM .  betamethasone acetate-betamethasone sodium phosphate (CELESTONE) injection 3  mg, 3 mg, Intramuscular, Once, Evans, Brent M, DPM .  betamethasone acetate-betamethasone sodium phosphate (CELESTONE) injection 3 mg, 3 mg, Intramuscular, Once, Evans, Brent M, DPM .  betamethasone acetate-betamethasone sodium phosphate (CELESTONE) injection 3 mg, 3 mg, Intramuscular, Once, Evans, Brent M, DPM .  betamethasone acetate-betamethasone sodium phosphate (CELESTONE) injection 3 mg, 3 mg, Intramuscular, Once, Edrick Kins, DPM  Review of Systems  Constitutional: Negative for fatigue and fever.  HENT: Negative.   Respiratory: Negative.   Cardiovascular: Negative.   Gastrointestinal: Negative.  Negative for rectal pain.  Genitourinary: Positive for dysuria, frequency ("a little") and urgency ("a little"). Negative for flank pain and hematuria.    Social History   Tobacco Use  . Smoking status: Former Smoker    Last attempt to quit: 06/18/1968    Years since quitting: 49.9  . Smokeless tobacco: Never Used  Substance Use Topics  . Alcohol use: Yes    Alcohol/week: 4.0 standard drinks    Types: 4 Cans of beer per week    Comment: occasionally      Objective:   BP 129/74 (BP Location: Left Arm, Patient Position: Sitting, Cuff Size: Large)   Pulse 79   Temp (!) 97.5 F (36.4 C) (Oral)   Resp 16   Wt 195 lb 3.2 oz (88.5 kg)   BMI 27.22 kg/m  Vitals:   05/09/18 1415  BP: 129/74  Pulse: 79  Resp: 16  Temp: (!) 97.5 F (36.4 C)  TempSrc: Oral  Weight: 195 lb 3.2 oz (88.5 kg)  Physical Exam Vitals signs reviewed.  Constitutional:      General: He is not in acute distress.    Appearance: Normal appearance. He is well-developed. He is not ill-appearing or diaphoretic.  Cardiovascular:     Rate and Rhythm: Normal rate and regular rhythm.     Heart sounds: Normal heart sounds. No murmur. No friction rub. No gallop.   Pulmonary:     Effort: Pulmonary effort is normal. No respiratory distress.     Breath sounds: Normal breath sounds. No wheezing or rales.   Abdominal:     General: Bowel sounds are normal. There is no distension.     Palpations: Abdomen is soft. There is no mass.     Tenderness: There is no abdominal tenderness. There is no guarding or rebound.  Skin:    General: Skin is warm and dry.  Neurological:     Mental Status: He is alert and oriented to person, place, and time.        Assessment & Plan    1. Acute cystitis without hematuria Worsening symptoms. UA positive. Will treat empirically with Augmentin as below. Has failed cephalexin (2 rounds despite culture showing sensitivity). Continue to push fluids. Urine sent for culture. Will check labs as below and f/u pending results. Will follow up pending C&S results. She is to call if symptoms do not improve or if they worsen.  - Urine Culture - amoxicillin-clavulanate (AUGMENTIN) 875-125 MG tablet; Take 1 tablet by mouth 2 (two) times daily.  Dispense: 14 tablet; Refill: 0 - CBC w/Diff/Platelet - Renal Function Panel  2. Dysuria See above medical treatment plan. - POCT urinalysis dipstick - CBC w/Diff/Platelet - Renal Function Panel  3. Frequency of urination See above medical treatment plan. - PSA - CBC w/Diff/Platelet - Renal Function Panel     Mar Daring, PA-C  [facility]

## 2018-05-10 ENCOUNTER — Ambulatory Visit: Payer: Self-pay | Admitting: Physician Assistant

## 2018-05-10 ENCOUNTER — Telehealth: Payer: Self-pay

## 2018-05-10 LAB — RENAL FUNCTION PANEL
Albumin: 4.2 g/dL (ref 3.7–4.7)
BUN/Creatinine Ratio: 20 (ref 10–24)
BUN: 23 mg/dL (ref 8–27)
CO2: 23 mmol/L (ref 20–29)
Calcium: 9.6 mg/dL (ref 8.6–10.2)
Chloride: 106 mmol/L (ref 96–106)
Creatinine, Ser: 1.14 mg/dL (ref 0.76–1.27)
GFR calc non Af Amer: 62 mL/min/{1.73_m2} (ref >59)
GFR, EST AFRICAN AMERICAN: 71 mL/min/{1.73_m2} (ref >59)
Glucose: 96 mg/dL (ref 65–99)
Phosphorus: 3.1 mg/dL (ref 2.8–4.1)
Potassium: 3.7 mmol/L (ref 3.5–5.2)
Sodium: 145 mmol/L — ABNORMAL HIGH (ref 134–144)

## 2018-05-10 LAB — CBC WITH DIFFERENTIAL/PLATELET
Basophils Absolute: 0.1 10*3/uL (ref 0.0–0.2)
Basos: 1 %
EOS (ABSOLUTE): 0.4 10*3/uL (ref 0.0–0.4)
EOS: 6 %
Hematocrit: 40.8 % (ref 37.5–51.0)
Hemoglobin: 14.2 g/dL (ref 13.0–17.7)
Immature Grans (Abs): 0 10*3/uL (ref 0.0–0.1)
Immature Granulocytes: 0 %
Lymphocytes Absolute: 2.2 10*3/uL (ref 0.7–3.1)
Lymphs: 30 %
MCH: 31.8 pg (ref 26.6–33.0)
MCHC: 34.8 g/dL (ref 31.5–35.7)
MCV: 92 fL (ref 79–97)
Monocytes Absolute: 0.7 10*3/uL (ref 0.1–0.9)
Monocytes: 9 %
Neutrophils Absolute: 4.1 10*3/uL (ref 1.4–7.0)
Neutrophils: 54 %
Platelets: 246 10*3/uL (ref 150–450)
RBC: 4.46 x10E6/uL (ref 4.14–5.80)
RDW: 12.8 % (ref 11.6–15.4)
WBC: 7.5 10*3/uL (ref 3.4–10.8)

## 2018-05-10 LAB — PSA: Prostate Specific Ag, Serum: 3.9 ng/mL (ref 0.0–4.0)

## 2018-05-10 NOTE — Telephone Encounter (Signed)
Spoke to patient regarding lab results.

## 2018-05-10 NOTE — Telephone Encounter (Signed)
-----   Message from Mar Daring, [place] sent at 05/10/2018  2:42 PM EDT ----- Blood count is normal. Kidney function is normal. PSA is normal.

## 2018-05-12 ENCOUNTER — Telehealth: Payer: Self-pay | Admitting: *Deleted

## 2018-05-12 LAB — URINE CULTURE

## 2018-05-12 NOTE — Telephone Encounter (Signed)
-----   Message from Mar Daring, [place] sent at 05/12/2018 12:24 PM EDT ----- Urine culture is positive for the same bacteria again. It is sensitive to augmentin as prescribed. Continue until completed. Call if symptoms fail to improve.

## 2018-05-12 NOTE — Telephone Encounter (Signed)
LMOVM for pt to return call 

## 2018-05-13 NOTE — Telephone Encounter (Signed)
Spoke to patient regarding results. He was told to follow up if symptoms are not better after finishing antibiotics.

## 2018-05-23 ENCOUNTER — Telehealth: Payer: Self-pay | Admitting: *Deleted

## 2018-05-23 DIAGNOSIS — N390 Urinary tract infection, site not specified: Secondary | ICD-10-CM

## 2018-05-23 MED ORDER — CIPROFLOXACIN HCL 500 MG PO TABS: 500.0000 mg | ORAL_TABLET | Freq: Two times a day (BID) | ORAL | 0 refills | Status: DC

## 2018-05-23 NOTE — Telephone Encounter (Signed)
I am going to refer him to Urology. He has had multiple UTIs recently starting back with Mikki Santee in February. I have also sent in Cipro x 7 days

## 2018-05-23 NOTE — Telephone Encounter (Signed)
LMTCB

## 2018-05-23 NOTE — Telephone Encounter (Signed)
Patient states he had a UTI about 2 weeks ago and completed antibiotic. Patient states symptoms have come back and would like another round of antibiotics. [place] Please advise?

## 2018-05-26 NOTE — Telephone Encounter (Signed)
Patient notified medication sent to the pharmacy. He already has appointment with urology 07/11/2018.

## 2018-05-26 NOTE — Telephone Encounter (Signed)
Left message for patient to call.

## 2018-06-07 ENCOUNTER — Encounter: Payer: Self-pay | Admitting: Physician Assistant

## 2018-06-07 ENCOUNTER — Ambulatory Visit (INDEPENDENT_AMBULATORY_CARE_PROVIDER_SITE_OTHER): Payer: PPO | Admitting: Physician Assistant

## 2018-06-07 ENCOUNTER — Other Ambulatory Visit: Payer: Self-pay

## 2018-06-07 VITALS — BP 130/76 | HR 79 | Temp 98.4°F | Wt 198.0 lb

## 2018-06-07 DIAGNOSIS — L03115 Cellulitis of right lower limb: Secondary | ICD-10-CM

## 2018-06-07 DIAGNOSIS — L0390 Cellulitis, unspecified: Secondary | ICD-10-CM

## 2018-06-07 MED ORDER — CEPHALEXIN 500 MG PO CAPS: 500.0000 mg | ORAL_CAPSULE | Freq: Two times a day (BID) | ORAL | 0 refills | Status: DC
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
## 2018-06-07 NOTE — Patient Instructions (Signed)
Cellulitis, Adult  Cellulitis is a skin infection. The infected area is usually warm, red, swollen, and tender. This condition occurs most often in the arms and lower legs. The infection can travel to the muscles, blood, and underlying tissue and become serious. It is very important to get treated for this condition. What are the causes? Cellulitis is caused by bacteria. The bacteria enter through a break in the skin, such as a cut, burn, insect bite, open sore, or crack. What increases the risk? This condition is more likely to occur in people who:  Have a weak body defense system (immune system).  Have open wounds on the skin, such as cuts, burns, bites, and scrapes. Bacteria can enter the body through these open wounds.  Are older than 78 years of age.  Have diabetes.  Have a type of long-lasting (chronic) liver disease (cirrhosis) or kidney disease.  Are obese.  Have a skin condition such as: ? Itchy rash (eczema). ? Slow movement of blood in the veins (venous stasis). ? Fluid buildup below the skin (edema).  Have had radiation therapy.  Use IV drugs. What are the signs or symptoms? Symptoms of this condition include:  Redness, streaking, or spotting on the skin.  Swollen area of the skin.  Tenderness or pain when an area of the skin is touched.  Warm skin.  A fever.  Chills.  Blisters. How is this diagnosed? This condition is diagnosed based on a medical history and physical exam. You may also have tests, including:  Blood tests.  Imaging tests. How is this treated? Treatment for this condition may include:  Medicines, such as antibiotic medicines or medicines to treat allergies (antihistamines).  Supportive care, such as rest and application of cold or warm cloths (compresses) to the skin.  Hospital care, if the condition is severe. The infection usually starts to get better within 1-2 days of treatment. Follow these instructions at home:  Medicines   Take over-the-counter and prescription medicines only as told by your health care provider.  If you were prescribed an antibiotic medicine, take it as told by your health care provider. Do not stop taking the antibiotic even if you start to feel better. General instructions  Drink enough fluid to keep your urine pale yellow.  Do not touch or rub the infected area.  Raise (elevate) the infected area above the level of your heart while you are sitting or lying down.  Apply warm or cold compresses to the affected area as told by your health care provider.  Keep all follow-up visits as told by your health care provider. This is important. These visits let your health care provider make sure a more serious infection is not developing. Contact a health care provider if:  You have a fever.  Your symptoms do not begin to improve within 1-2 days of starting treatment.  Your bone or joint underneath the infected area becomes painful after the skin has healed.  Your infection returns in the same area or another area.  You notice a swollen bump in the infected area.  You develop new symptoms.  You have a general ill feeling (malaise) with muscle aches and pains. Get help right away if:  Your symptoms get worse.  You feel very sleepy.  You develop vomiting or diarrhea that persists.  You notice red streaks coming from the infected area.  Your red area gets larger or turns dark in color. These symptoms may represent a serious problem that is an  emergency. Do not wait to see if the symptoms will go away. Get medical help right away. Call your local emergency services (911 in the [place] Do not drive yourself to the hospital. Summary  Cellulitis is a skin infection. This condition occurs most often in the arms and lower legs.  Treatment for this condition may include medicines, such as antibiotic medicines or antihistamines.  Take over-the-counter and prescription medicines only as  told by your health care provider. If you were prescribed an antibiotic medicine, do not stop taking the antibiotic even if you start to feel better.  Contact a health care provider if your symptoms do not begin to improve within 1-2 days of starting treatment or your symptoms get worse.  Keep all follow-up visits as told by your health care provider. This is important. These visits let your health care provider make sure that a more serious infection is not developing. This information is not intended to replace advice given to you by your health care provider. Make sure you discuss any questions you have with your health care provider. Document Released: 11/05/2004 Document Revised: 06/17/2017 Document Reviewed: 06/17/2017 Elsevier Interactive Patient Education  2019 [employer] Chronic Venous Insufficiency Chronic venous insufficiency, also called venous stasis, is a condition that prevents blood from being pumped effectively through the veins in your legs. Blood may no longer be pumped effectively from the legs back to the heart. This condition can range from mild to severe. With proper treatment, you should be able to continue with an active life. What are the causes? Chronic venous insufficiency occurs when the vein walls become stretched, weakened, or damaged, or when valves within the vein are damaged. Some common causes of this include:  High blood pressure inside the veins (venous hypertension).  Increased blood pressure in the leg veins from long periods of sitting or standing.  A blood clot that blocks blood flow in a vein (deep vein thrombosis, DVT).  Inflammation of a vein (phlebitis) that causes a blood clot to form.  Tumors in the pelvis that cause blood to back up. What increases the risk? The following factors may make you more likely to develop this condition:  Having a family history of this condition.  Obesity.  Pregnancy.  Living without enough physical  activity or exercise (sedentary lifestyle).  Smoking.  Having a job that requires long periods of standing or sitting in one place.  Being a certain age. Women in their 2s and 72s and men in their 25s are more likely to develop this condition. What are the signs or symptoms? Symptoms of this condition include:  Veins that are enlarged, bulging, or twisted (varicose veins).  Skin breakdown or ulcers.  Reddened or discolored skin on the front of the leg.  Brown, smooth, tight, and painful skin just above the ankle, usually on the inside of the leg (lipodermatosclerosis).  Swelling. How is this diagnosed? This condition may be diagnosed based on:  Your medical history.  A physical exam.  Tests, such as: ? A procedure that creates an image of a blood vessel and nearby organs and provides information about blood flow through the blood vessel (duplex ultrasound). ? A procedure that tests blood flow (plethysmography). ? A procedure to look at the veins using X-ray and dye (venogram). How is this treated? The goals of treatment are to help you return to an active life and to minimize pain or disability. Treatment depends on the severity of your condition, and it may include:  Wearing compression stockings. These can help relieve symptoms and help prevent your condition from getting worse. However, they do not cure the condition.  Sclerotherapy. This is a procedure involving an injection of a material that "dissolves" damaged veins.  Surgery. This may involve: ? Removing a diseased vein (vein stripping). ? Cutting off blood flow through the vein (laser ablation surgery). ? Repairing a valve. Follow these instructions at home:      Wear compression stockings as told by your health care provider. These stockings help to prevent blood clots and reduce swelling in your legs.  Take over-the-counter and prescription medicines only as told by your health care provider.  Stay active  by exercising, walking, or doing different activities. Ask your health care provider what activities are safe for you and how much exercise you need.  Drink enough fluid to keep your urine clear or pale yellow.  Do not use any products that contain nicotine or tobacco, such as cigarettes and e-cigarettes. If you need help quitting, ask your health care provider.  Keep all follow-up visits as told by your health care provider. This is important. Contact a health care provider if:  You have redness, swelling, or more pain in the affected area.  You see a red streak or line that extends up or down from the affected area.  You have skin breakdown or a loss of skin in the affected area, even if the breakdown is small.  You get an injury in the affected area. Get help right away if:  You get an injury and an open wound in the affected area.  You have severe pain that does not get better with medicine.  You have sudden numbness or weakness in the foot or ankle below the affected area, or you have trouble moving your foot or ankle.  You have a fever and you have worse or persistent symptoms.  You have chest pain.  You have shortness of breath. Summary  Chronic venous insufficiency, also called venous stasis, is a condition that prevents blood from being pumped effectively through the veins in your legs.  Chronic venous insufficiency occurs when the vein walls become stretched, weakened, or damaged, or when valves within the vein are damaged.  Treatment for this condition depends on how severe your condition is, and it may involve wearing compression stockings or having a procedure.  Make sure you stay active by exercising, walking, or doing different activities. Ask your health care provider what activities are safe for you and how much exercise you need. This information is not intended to replace advice given to you by your health care provider. Make sure you discuss any questions you  have with your health care provider. Document Released: 06/01/2006 Document Revised: 12/16/2015 Document Reviewed: 12/16/2015 Elsevier Interactive Patient Education  2019 [employer].

## 2018-06-14 DIAGNOSIS — M25562 Pain in left knee: Secondary | ICD-10-CM | POA: Diagnosis not present

## 2018-06-14 DIAGNOSIS — M180 Bilateral primary osteoarthritis of first carpometacarpal joints: Secondary | ICD-10-CM | POA: Diagnosis not present

## 2018-07-11 ENCOUNTER — Ambulatory Visit: Payer: PPO | Admitting: Urology

## 2018-07-18 ENCOUNTER — Other Ambulatory Visit: Payer: Self-pay

## 2018-07-18 DIAGNOSIS — N529 Male erectile dysfunction, unspecified: Secondary | ICD-10-CM

## 2018-07-18 MED ORDER — SILDENAFIL CITRATE 20 MG PO TABS: ORAL_TABLET | ORAL | 5 refills | Status: DC

## 2018-07-18 NOTE — Telephone Encounter (Signed)
LMOVM for pt to return call 

## 2018-07-18 NOTE — Telephone Encounter (Signed)
Patient requesting refill on Sildenafil sent to [facility] on [address]. Patient also requesting referral for colonoscopy and upper endoscopy.

## 2018-07-18 NOTE — Telephone Encounter (Signed)
Med refilled.  Why does he need upper endoscopy? I don't think gen surg does upper. They will do the colonoscopy. If he is wanting both he may require GI referral instead.   Also I am not sure if they have started these back routinely yet, so I cannot promise the date he is requesting would be available.

## 2018-07-18 NOTE — Telephone Encounter (Signed)
Patient is requesting appt to be scheduled for Tue August 23, 2018 if possible.

## 2018-07-18 NOTE — Telephone Encounter (Signed)
Patient would like the appt be made with Dr. Angie Fava partner. sd

## 2018-07-20 ENCOUNTER — Other Ambulatory Visit: Payer: Self-pay | Admitting: Physician Assistant

## 2018-07-20 DIAGNOSIS — K219 Gastro-esophageal reflux disease without esophagitis: Secondary | ICD-10-CM

## 2018-07-20 DIAGNOSIS — Z1211 Encounter for screening for malignant neoplasm of colon: Secondary | ICD-10-CM

## 2018-07-20 DIAGNOSIS — R110 Nausea: Secondary | ICD-10-CM

## 2018-07-20 NOTE — Progress Notes (Signed)
Referral placed to GI for colonoscopy and consideration of EGD

## 2018-07-20 NOTE — Telephone Encounter (Signed)
Patient reports he has "low grade nausea" and would like to have referral for endoscopy. Patient agreed to GI referral.

## 2018-07-27 DIAGNOSIS — M180 Bilateral primary osteoarthritis of first carpometacarpal joints: Secondary | ICD-10-CM | POA: Diagnosis not present

## 2018-08-22 DIAGNOSIS — H43393 Other vitreous opacities, bilateral: Secondary | ICD-10-CM | POA: Diagnosis not present

## 2018-08-22 DIAGNOSIS — Z961 Presence of intraocular lens: Secondary | ICD-10-CM | POA: Diagnosis not present

## 2018-08-26 ENCOUNTER — Ambulatory Visit (INDEPENDENT_AMBULATORY_CARE_PROVIDER_SITE_OTHER): Payer: PPO | Admitting: Physician Assistant

## 2018-08-26 ENCOUNTER — Other Ambulatory Visit: Payer: Self-pay

## 2018-08-26 ENCOUNTER — Encounter: Payer: Self-pay | Admitting: Physician Assistant

## 2018-08-26 VITALS — BP 125/77 | HR 86 | Temp 98.0°F | Resp 16 | Ht 71.0 in | Wt 194.0 lb

## 2018-08-26 DIAGNOSIS — K1120 Sialoadenitis, unspecified: Secondary | ICD-10-CM

## 2018-08-26 MED ORDER — DOXYCYCLINE HYCLATE 100 MG PO TABS: 100.0000 mg | ORAL_TABLET | Freq: Two times a day (BID) | ORAL | 0 refills | Status: DC

## 2018-08-26 NOTE — Progress Notes (Signed)
Patient: Edward Rodriguez Male    DOB: 14-May-1940   78 y.o.   MRN: [id number] Visit Date: 08/26/2018  Today's Provider: Mar Daring, PA-C   Chief Complaint  Patient presents with  . Mass   Subjective:     HPI  Patient here with c/o knot/swollen lymph node around his left ear. Patient noticed this morning. No URI symptoms. No fever. Does have chronic allergies and uses flonase and zyrtec OTC.   Allergies  Allergen Reactions  . Sulfa Antibiotics   . Sulfur Rash     Current Outpatient Medications:  .  clindamycin (CLEOCIN T) 1 % external solution, , Disp: , Rfl:  .  meloxicam (MOBIC) 15 MG tablet, , Disp: , Rfl:  .  omeprazole (PRILOSEC) 20 MG capsule, Take 40 mg by mouth every other day., Disp: , Rfl:  .  pramoxine-hydrocortisone (PROCTOCREAM-HC) 1-1 % rectal cream, Place rectally 3 (three) times daily. As needed for hemorrhoids, Disp: 30 g, Rfl: 2 .  sildenafil (REVATIO) 20 MG tablet, TAKE 2-5 TABLETS BY MOUTH ONCE DAILY AS NEEDED FOR ERECTILE DYSFUNCTION, Disp: 50 tablet, Rfl: 5 .  venlafaxine XR (EFFEXOR-XR) 150 MG 24 hr capsule, TAKE 1 CAPSULE BY MOUTH IN THE MORNING *TIME FOR OFFICE VISIT*, Disp: 90 capsule, Rfl: 1 .  cephALEXin (KEFLEX) 500 MG capsule, Take 1 capsule (500 mg total) by mouth 2 (two) times daily. (Patient not taking: Reported on 08/26/2018), Disp: 10 capsule, Rfl: 0 .  ciprofloxacin (CIPRO) 500 MG tablet, Take 1 tablet (500 mg total) by mouth 2 (two) times daily. (Patient not taking: Reported on 06/07/2018), Disp: 14 tablet, Rfl: 0 .  SODIUM CHLORIDE, EXTERNAL, ([facility] SALINE WOUND [place]) 0.9 % SOLN, Apply 2 application topically 2 (two) times daily. (Patient not taking: Reported on 08/26/2018), Disp: 210 mL, Rfl: 0  Current Facility-Administered Medications:  .  betamethasone acetate-betamethasone sodium phosphate (CELESTONE) injection 3 mg, 3 mg, Intramuscular, Once, Evans, Brent M, DPM .  betamethasone acetate-betamethasone sodium phosphate (CELESTONE)  injection 3 mg, 3 mg, Intramuscular, Once, Evans, Brent M, DPM .  betamethasone acetate-betamethasone sodium phosphate (CELESTONE) injection 3 mg, 3 mg, Intramuscular, Once, Evans, Brent M, DPM .  betamethasone acetate-betamethasone sodium phosphate (CELESTONE) injection 3 mg, 3 mg, Intramuscular, Once, Evans, Brent M, DPM .  betamethasone acetate-betamethasone sodium phosphate (CELESTONE) injection 3 mg, 3 mg, Intramuscular, Once, Evans, Brent M, DPM .  betamethasone acetate-betamethasone sodium phosphate (CELESTONE) injection 3 mg, 3 mg, Intramuscular, Once, Edrick Kins, DPM  Review of Systems  Constitutional: Negative for appetite change, chills and fever.  HENT: Positive for sinus pressure (chronic). Negative for congestion, ear pain, postnasal drip, rhinorrhea, sinus pain, sore throat and trouble swallowing.   Respiratory: Negative for chest tightness, shortness of breath and wheezing.   Cardiovascular: Negative for chest pain and palpitations.  Gastrointestinal: Negative for abdominal pain, nausea and vomiting.  Hematological: Positive for adenopathy.    Social History   Tobacco Use  . Smoking status: Former Smoker    Quit date: 06/18/1968    Years since quitting: 50.2  . Smokeless tobacco: Never Used  Substance Use Topics  . Alcohol use: Yes    Alcohol/week: 4.0 standard drinks    Types: 4 Cans of beer per week    Comment: occasionally      Objective:   BP 125/77 (BP Location: Left Arm, Patient Position: Sitting, Cuff Size: Large)   Pulse 86   Temp 98 F (36.7 C) (Oral)   Resp 16  Ht 5\' 11"  (1.803 m)   Wt 194 lb (88 kg)   SpO2 94%   BMI 27.06 kg/m  Vitals:   08/26/18 1555  BP: 125/77  Pulse: 86  Resp: 16  Temp: 98 F (36.7 C)  TempSrc: Oral  SpO2: 94%  Weight: 194 lb (88 kg)  Height: 5\' 11"  (1.803 m)     Physical Exam Vitals signs reviewed.  Constitutional:      General: He is not in acute distress.    Appearance: Normal appearance. He is  well-developed. He is not ill-appearing or diaphoretic.  HENT:     Head: Normocephalic and atraumatic.      Right Ear: Hearing, tympanic membrane, ear canal and external ear normal. No middle ear effusion. Tympanic membrane is not erythematous or bulging.     Left Ear: Hearing, tympanic membrane, ear canal and external ear normal.  No middle ear effusion. Tympanic membrane is not erythematous or bulging.     Nose: No nasal tenderness, mucosal edema, congestion or rhinorrhea.     Right Sinus: No maxillary sinus tenderness or frontal sinus tenderness.     Left Sinus: No maxillary sinus tenderness or frontal sinus tenderness.     Mouth/Throat:     Mouth: Mucous membranes are moist.     Pharynx: Uvula midline. No oropharyngeal exudate or posterior oropharyngeal erythema.  Eyes:     General:        Right eye: No discharge.        Left eye: No discharge.     Extraocular Movements: Extraocular movements intact.     Conjunctiva/sclera: Conjunctivae normal.     Pupils: Pupils are equal, round, and reactive to light.  Neck:     Musculoskeletal: Normal range of motion and neck supple.     Thyroid: No thyromegaly.     Trachea: No tracheal deviation.     Meningeal: Brudzinski's sign and Kernig's sign absent.  Cardiovascular:     Rate and Rhythm: Normal rate and regular rhythm.     Heart sounds: Normal heart sounds. No murmur. No friction rub. No gallop.   Pulmonary:     Effort: Pulmonary effort is normal. No respiratory distress.     Breath sounds: Normal breath sounds. No stridor. No wheezing or rales.  Lymphadenopathy:     Cervical: No cervical adenopathy.  Skin:    General: Skin is warm and dry.  Neurological:     Mental Status: He is alert.      No results found for any visits on 08/26/18.     Assessment & Plan    1. Parotitis Will treat with doxycycline as below. Salt water gargles prn. Warm compresses prn. Push fluids. Call if worsening.  - doxycycline (VIBRA-TABS) 100 MG  tablet; Take 1 tablet (100 mg total) by mouth 2 (two) times daily.  Dispense: 14 tablet; Refill: 0  I,[name],acting as a scribe for [employer], PA-C.,have documented all relevant documentation on the behalf of Mar Daring, PA-C,as directed by  Mar Daring, PA-C while in the presence of Mar Daring, PA-C.   Mar Daring, PA-C  [facility]

## 2018-08-26 NOTE — Patient Instructions (Signed)
Parotitis  Parotitis is inflammation of one or both of your parotid glands. These glands produce saliva. They are found on each side of your face, below and in front of your earlobes. The saliva that they produce comes out of tiny openings (ducts) inside your cheeks. Parotitis may cause sudden swelling and pain (acute parotitis). It can also cause repeated episodes of swelling and pain or continued swelling that may or may not be painful (chronic parotitis). What are the causes? This condition may be caused by:  Infections from bacteria.  Infections from viruses, such as mumps or HIV.  Blockage (obstruction) of saliva flow through the parotid glands. This can be from a stone, scar tissue, or a tumor.  Diseases that cause your body's defense system (immune system) to attack healthy cells in your salivary glands. These are called autoimmune diseases. What increases the risk? You are more likely to develop this condition if:  You are 50 years old or older.  You do not drink enough fluids (are dehydrated).  You drink too much alcohol.  You have: ? A dry mouth. ? Poor dental hygiene. ? Diabetes. ? Gout. ? A long-term illness.  You have had radiation treatments to the head and neck.  You take certain medicines. What are the signs or symptoms? Symptoms of this condition depend on the cause. Symptoms may include:  Swelling under and in front of the ear. This may get worse after eating.  Redness of the skin over the parotid gland.  Pain and tenderness over the parotid gland. This may get worse after eating.  Fever or chills.  Pus coming from the ducts inside the mouth.  Dry mouth.  A bad taste in the mouth. How is this diagnosed? This condition may be diagnosed based on:  Your medical history.  A physical exam.  Tests to find the cause of the parotitis. These may include: ? Doing blood tests to check for an autoimmune disease or infections from a virus. ? Taking a  fluid sample from the parotid gland and testing it for infection. ? Injecting the ducts of the parotid gland with a dye and then taking X-rays (sialogram). ? Having other imaging tests of the gland, such as X-rays, ultrasound, MRI, or CT scan. ? Checking the opening of the gland for a stone or obstruction. ? Placing a needle into the gland to remove tissue for a biopsy (fine needle aspiration). How is this treated? Treatment for this condition depends on the cause. Treatment may include:  Antibiotic medicine for a bacterial infection.  Drinking more fluids.  Removing a stone or obstruction.  Treating an underlying disease that is causing parotitis.  Surgery to drain an infection, remove a growth, or remove the whole gland (parotidectomy). Treatment may not be needed if parotid swelling goes away with home care. Follow these instructions at home: Medicines   Take over-the-counter and prescription medicines only as told by your health care provider.  If you were prescribed an antibiotic medicine, take it as told by your health care provider. Do not stop taking the antibiotic even if you start to feel better. Managing pain and swelling  If directed, apply heat to the affected area as often as told by your health care provider. Use the heat source that your health care provider recommends, such as a moist heat pack or a heating pad. To apply the heat: ? Place a towel between your skin and the heat source. ? Leave the heat on for 20-30 minutes. ?   Remove the heat if your skin turns bright red. This is especially important if you are unable to feel pain, heat, or cold. You may have a greater risk of getting burned.  Gargle with a salt-water mixture 3-4 times a day or as needed. To make a salt-water mixture, completely dissolve -1 tsp (3-6 g) of salt in 1 cup (237 mL) of warm water.  Gently massage the parotid glands as told by your health care provider. General instructions   Drink  enough fluid to keep your urine pale yellow.  Keep your mouth clean and moist.  Try sucking on sour candy. This may help to make your mouth less dry by stimulating the flow of saliva.  Maintain good oral health. ? Brush your teeth at least two times a day. ? Floss your teeth every day. ? See your dentist regularly.  Do not use any products that contain nicotine or tobacco, such as cigarettes, e-cigarettes, and chewing tobacco. If you need help quitting, ask your health care provider.  Do not drink alcohol.  Keep all follow-up visits as told by your health care provider. This is important. Contact a health care provider if:  You have a fever or chills.  You have new symptoms.  Your symptoms get worse.  Your symptoms do not improve with treatment. Get help right away if:  You have difficulty breathing or swallowing because of the swollen gland. Summary  Parotitis is inflammation of one or both of your parotid glands.  Symptoms include pain and swelling under and in front of the ear. They may also include a fever and a bad taste in your mouth.  This condition may be treated with antibiotics, increasing fluids, or surgery.  In some cases, parotitis may go away on its own without treatment.  You should drink plenty of fluids, maintain good oral hygiene, and avoid tobacco products. This information is not intended to replace advice given to you by your health care provider. Make sure you discuss any questions you have with your health care provider. Document Released: 07/18/2001 Document Revised: 08/24/2017 Document Reviewed: 08/24/2017 Elsevier Patient Education  [place].

## 2018-09-12 ENCOUNTER — Ambulatory Visit: Payer: No Typology Code available for payment source | Admitting: Gastroenterology

## 2018-09-14 DIAGNOSIS — D692 Other nonthrombocytopenic purpura: Secondary | ICD-10-CM | POA: Diagnosis not present

## 2018-09-14 DIAGNOSIS — L820 Inflamed seborrheic keratosis: Secondary | ICD-10-CM | POA: Diagnosis not present

## 2018-09-14 DIAGNOSIS — L570 Actinic keratosis: Secondary | ICD-10-CM | POA: Diagnosis not present

## 2018-09-14 DIAGNOSIS — L578 Other skin changes due to chronic exposure to nonionizing radiation: Secondary | ICD-10-CM | POA: Diagnosis not present

## 2018-09-14 DIAGNOSIS — Z1283 Encounter for screening for malignant neoplasm of skin: Secondary | ICD-10-CM | POA: Diagnosis not present

## 2018-09-14 DIAGNOSIS — L821 Other seborrheic keratosis: Secondary | ICD-10-CM | POA: Diagnosis not present

## 2018-09-27 DIAGNOSIS — H04129 Dry eye syndrome of unspecified lacrimal gland: Secondary | ICD-10-CM | POA: Diagnosis not present

## 2018-11-21 ENCOUNTER — Telehealth: Payer: Self-pay | Admitting: *Deleted

## 2018-11-21 DIAGNOSIS — R110 Nausea: Secondary | ICD-10-CM

## 2018-11-21 MED ORDER — ONDANSETRON HCL 4 MG PO TABS: 4.0000 mg | ORAL_TABLET | Freq: Three times a day (TID) | ORAL | 0 refills | Status: DC | PRN

## 2018-11-21 NOTE — Telephone Encounter (Signed)
Patient states has seen Edward Rodriguez for what pt calls (low-grade nausea) in the past. Patient states he has had this for years. Low grade nausea usually occurs in the mornings intermittently. Please advise?

## 2018-11-21 NOTE — Telephone Encounter (Signed)
Patient called office requesting a nausea medication be called into pharmacy. Patient states medication is for recurrent nausea. Please advise? [place].

## 2018-11-21 NOTE — Telephone Encounter (Signed)
Patient was advised.  

## 2018-11-21 NOTE — Telephone Encounter (Signed)
How long has nausea been going on? Has he ever been seen for this in the past?

## 2018-11-21 NOTE — Telephone Encounter (Signed)
Zofran sent in but this may be something that needs to be addressed if it keeps happening.

## 2018-11-24 DIAGNOSIS — M13841 Other specified arthritis, right hand: Secondary | ICD-10-CM | POA: Diagnosis not present

## 2018-11-24 DIAGNOSIS — M13842 Other specified arthritis, left hand: Secondary | ICD-10-CM | POA: Diagnosis not present

## 2018-12-02 DIAGNOSIS — S80211A Abrasion, right knee, initial encounter: Secondary | ICD-10-CM | POA: Diagnosis not present

## 2018-12-02 DIAGNOSIS — S61402A Unspecified open wound of left hand, initial encounter: Secondary | ICD-10-CM | POA: Diagnosis not present

## 2018-12-04 ENCOUNTER — Other Ambulatory Visit: Payer: Self-pay | Admitting: Family Medicine

## 2018-12-12 DIAGNOSIS — S80211D Abrasion, right knee, subsequent encounter: Secondary | ICD-10-CM | POA: Diagnosis not present

## 2018-12-12 DIAGNOSIS — S61402D Unspecified open wound of left hand, subsequent encounter: Secondary | ICD-10-CM | POA: Diagnosis not present

## 2018-12-14 DIAGNOSIS — M25559 Pain in unspecified hip: Secondary | ICD-10-CM | POA: Diagnosis not present

## 2018-12-28 DIAGNOSIS — M1612 Unilateral primary osteoarthritis, left hip: Secondary | ICD-10-CM | POA: Diagnosis not present

## 2019-01-09 ENCOUNTER — Other Ambulatory Visit: Payer: Self-pay

## 2019-01-09 DIAGNOSIS — Z20822 Contact with and (suspected) exposure to covid-19: Secondary | ICD-10-CM

## 2019-01-11 LAB — NOVEL CORONAVIRUS, NAA: SARS-CoV-2, NAA: DETECTED — AB

## 2019-01-12 ENCOUNTER — Telehealth: Payer: Self-pay | Admitting: Nurse Practitioner

## 2019-01-12 NOTE — Telephone Encounter (Signed)
Called to Discuss with patient about Covid symptoms and the use of bamlanivimab, a monoclonal antibody infusion for those with mild to moderate Covid symptoms and at a high risk of hospitalization.     Pt is qualified for this infusion at the [facility] infusion center due to co-morbid conditions and/or a member of an at-risk group.  Patient is at-risk due to age.   Patient's symptoms started greater than 10 days ago and due to this does not qualify for treatment.

## 2019-01-16 ENCOUNTER — Ambulatory Visit: Payer: Self-pay

## 2019-01-16 NOTE — Telephone Encounter (Signed)
From [facility] 

## 2019-01-16 NOTE — Telephone Encounter (Signed)
Pt. Calling to verify COVID 19 quarantine requirements and symptoms.  Will quarantine x 10 days from beginning of symptoms and be fever free x 3 days without having to take Tylenol.Verbalizes understanding.

## 2019-01-16 NOTE — Telephone Encounter (Signed)
noted 

## 2019-01-25 ENCOUNTER — Ambulatory Visit (INDEPENDENT_AMBULATORY_CARE_PROVIDER_SITE_OTHER): Payer: PPO | Admitting: Physician Assistant

## 2019-01-25 ENCOUNTER — Other Ambulatory Visit: Payer: Self-pay

## 2019-01-25 ENCOUNTER — Encounter: Payer: Self-pay | Admitting: Physician Assistant

## 2019-01-25 DIAGNOSIS — R21 Rash and other nonspecific skin eruption: Secondary | ICD-10-CM | POA: Diagnosis not present

## 2019-01-25 MED ORDER — PREDNISONE 20 MG PO TABS: 20.0000 mg | ORAL_TABLET | Freq: Every day | ORAL | 0 refills | Status: DC

## 2019-01-25 NOTE — Progress Notes (Signed)
Patient: Edward Rodriguez Male    DOB: 12/23/1940   78 y.o.   MRN: [id number] Visit Date: 01/25/2019  Today's Provider: Mar Daring, PA-C   Chief Complaint  Patient presents with  . Rash   Subjective:     Virtual Visit via Telephone Note  I connected with Edward Rodriguez on 01/25/19 at 11:00 AM EST by telephone and verified that I am speaking with the correct person using two identifiers.  Location: Patient: Home Provider: [facility]   I discussed the limitations, risks, security and privacy concerns of performing an evaluation and management service by telephone and the availability of in person appointments. I also discussed with the patient that there may be a patient responsible charge related to this service. The patient expressed understanding and agreed to proceed.   Rash This is a chronic problem. The current episode started 1 to 4 weeks ago. The problem has been gradually worsening (has gotten worse over the last 3-6 months) since onset. The affected locations include the torso, back and chest. The rash is characterized by redness, itchiness, scaling and dryness. He was exposed to nothing. Pertinent negatives include no congestion, cough, diarrhea, eye pain, facial edema, fatigue, fever, joint pain, nail changes, rhinorrhea, shortness of breath, sore throat or vomiting. Treatments tried: cerave. The treatment provided mild relief.  He has been seen by Dr. Nehemiah Massed, dermatology, and has shown him the rash. There has been no treatment or explanation given per patient.   Allergies  Allergen Reactions  . Sulfa Antibiotics   . Sulfur Rash     Current Outpatient Medications:  .  clindamycin (CLEOCIN T) 1 % external solution, , Disp: , Rfl:  .  meloxicam (MOBIC) 15 MG tablet, , Disp: , Rfl:  .  omeprazole (PRILOSEC) 20 MG capsule, Take 40 mg by mouth every other day., Disp: , Rfl:  .  ondansetron (ZOFRAN) 4 MG tablet, Take 1 tablet (4 mg total) by mouth every 8 (eight) hours as  needed., Disp: 20 tablet, Rfl: 0 .  pramoxine-hydrocortisone (PROCTOCREAM-HC) 1-1 % rectal cream, Place rectally 3 (three) times daily. As needed for hemorrhoids, Disp: 30 g, Rfl: 2 .  predniSONE (DELTASONE) 20 MG tablet, Take 1 tablet (20 mg total) by mouth daily with breakfast., Disp: 10 tablet, Rfl: 0 .  sildenafil (REVATIO) 20 MG tablet, TAKE 2-5 TABLETS BY MOUTH ONCE DAILY AS NEEDED FOR ERECTILE DYSFUNCTION, Disp: 50 tablet, Rfl: 5 .  venlafaxine XR (EFFEXOR-XR) 150 MG 24 hr capsule, TAKE 1 CAPSULE BY MOUTH IN THE MORNING, Disp: 90 capsule, Rfl: 1  Current Facility-Administered Medications:  .  betamethasone acetate-betamethasone sodium phosphate (CELESTONE) injection 3 mg, 3 mg, Intramuscular, Once, Evans, Brent M, DPM .  betamethasone acetate-betamethasone sodium phosphate (CELESTONE) injection 3 mg, 3 mg, Intramuscular, Once, Evans, Brent M, DPM .  betamethasone acetate-betamethasone sodium phosphate (CELESTONE) injection 3 mg, 3 mg, Intramuscular, Once, Evans, Brent M, DPM .  betamethasone acetate-betamethasone sodium phosphate (CELESTONE) injection 3 mg, 3 mg, Intramuscular, Once, Evans, Brent M, DPM .  betamethasone acetate-betamethasone sodium phosphate (CELESTONE) injection 3 mg, 3 mg, Intramuscular, Once, Evans, Brent M, DPM .  betamethasone acetate-betamethasone sodium phosphate (CELESTONE) injection 3 mg, 3 mg, Intramuscular, Once, Edrick Kins, DPM  Review of Systems  Constitutional: Negative for chills, fatigue and fever.  HENT: Negative for congestion, rhinorrhea and sore throat.   Eyes: Negative for pain.  Respiratory: Negative for cough and shortness of breath.   Cardiovascular: Negative for chest pain,  palpitations and leg swelling.  Gastrointestinal: Negative for diarrhea and vomiting.  Musculoskeletal: Negative for joint pain.  Skin: Positive for rash. Negative for nail changes.       itching  Neurological: Negative.     Social History   Tobacco Use  . Smoking  status: Former Smoker    Quit date: 06/18/1968    Years since quitting: 50.6  . Smokeless tobacco: Never Used  Substance Use Topics  . Alcohol use: Yes    Alcohol/week: 4.0 standard drinks    Types: 4 Cans of beer per week    Comment: occasionally      Objective:   There were no vitals taken for this visit. There were no vitals filed for this visit.There is no height or weight on file to calculate BMI.   Physical Exam Vitals reviewed.  Constitutional:      General: He is not in acute distress. Pulmonary:     Effort: No respiratory distress.  Neurological:     Mental Status: He is alert.      No results found for any visits on 01/25/19.     Assessment & Plan     1. Rash and nonspecific skin eruption I am unable to see Dr. Alveria Apley notes but saw mention of other nonthrombocytopenic purpura. Unsure if this is rash patient speaks of since this visit was limited by being a telephone visit and I am unable to visualize the rash. He is recently recovering from Covid-19 but it appears this rash has been present for a while with it worsening over the last 3-6 months per patient. Discussed to use a sensitive skin gentle soap, luke warm water (not hot) while showering, and continue CeraVe cream as needed to keep skin moist. Will give prednisone as below for itching and to see if this helps calm rash. He reports having a f/u with Dr. Thana Farr in January. Advised need to be evaluated in person to visualize rash if symptoms do not improve. He voices understanding.  - predniSONE (DELTASONE) 20 MG tablet; Take 1 tablet (20 mg total) by mouth daily with breakfast.  Dispense: 10 tablet; Refill: 0   I discussed the assessment and treatment plan with the patient. The patient was provided an opportunity to ask questions and all were answered. The patient agreed with the plan and demonstrated an understanding of the instructions.   The patient was advised to call back or seek an in-person  evaluation if the symptoms worsen or if the condition fails to improve as anticipated.  I provided 13 minutes of non-face-to-face time during this encounter.     Mar Daring, PA-C  [facility]

## 2019-01-25 NOTE — Patient Instructions (Signed)
Rash, Adult A rash is a change in the color of your skin. A rash can also change the way your skin feels. There are many different conditions and factors that can cause a rash. Some rashes may disappear after a few days, but some may last for a few weeks. Common causes of rashes include:  Viral infections, such as: ? Colds. ? Measles. ? Hand, foot, and mouth disease.  Bacterial infections, such as: ? Scarlet fever. ? Impetigo.  Fungal infections, such as Candida.  Allergic reactions to food, medicines, or skin care products. Follow these instructions at home: The goal of treatment is to stop the itching and keep the rash from spreading. Pay attention to any changes in your symptoms. Follow these instructions to help with your condition: Medicine Take or apply over-the-counter and prescription medicines only as told by your health care provider. These may include:  Corticosteroid creams to treat red or swollen skin.  Anti-itch lotions.  Oral allergy medicines (antihistamines).  Oral corticosteroids for severe symptoms.  Skin care  Apply cool compresses to the affected areas.  [place] not scratch or rub your skin.  Avoid covering the rash. Make sure the rash is exposed to air as much as possible. Managing itching and discomfort  Avoid hot showers or baths, which can make itching worse. A cold shower may help.  Try taking a bath with: ? Epsom salts. Follow manufacturer instructions on the packaging. You can get these at your local pharmacy or grocery store. ? Baking soda. Pour a small amount into the bath as told by your health care provider. ? Colloidal oatmeal. Follow manufacturer instructions on the packaging. You can get this at your local pharmacy or grocery store.  Try applying baking soda paste to your skin. Stir water into baking soda until it reaches a paste-like consistency.  Try applying calamine lotion. This is an over-the-counter lotion that helps to relieve  itchiness.  Keep cool and out of the sun. Sweating and being hot can make itching worse. General instructions   Rest as needed.  Drink enough fluid to keep your urine pale yellow.  Wear loose-fitting clothing.  Avoid scented soaps, detergents, and perfumes. Use gentle soaps, detergents, perfumes, and other cosmetic products.  Avoid any substance that causes your rash. Keep a journal to help track what causes your rash. Write down: ? What you eat. ? What cosmetic products you use. ? What you drink. ? What you wear. This includes jewelry.  Keep all follow-up visits as told by your health care provider. This is important. Contact a health care provider if:  You sweat at night.  You lose weight.  You urinate more than normal.  You urinate less than normal, or you notice that your urine is a darker color than usual.  You feel weak.  You vomit.  Your skin or the whites of your eyes look yellow (jaundice).  Your skin: ? Tingles. ? Is numb.  Your rash: ? Does not go away after several days. ? Gets worse.  You are: ? Unusually thirsty. ? More tired than normal.  You have: ? New symptoms. ? Pain in your abdomen. ? A fever. ? Diarrhea. Get help right away if you:  Have a fever and your symptoms suddenly get worse.  Develop confusion.  Have a severe headache or a stiff neck.  Have severe joint pains or stiffness.  Have a seizure.  Develop a rash that covers all or most of your body. The rash may   or may not be painful.  Develop blisters that: ? Are on top of the rash. ? Grow larger or grow together. ? Are painful. ? Are inside your nose or mouth.  Develop a rash that: ? Looks like purple pinprick-sized spots all over your body. ? Has a "bull's eye" or looks like a target. ? Is not related to sun exposure, is red and painful, and causes your skin to peel. Summary  A rash is a change in the color of your skin. Some rashes disappear after a few days,  but some may last for a few weeks.  The goal of treatment is to stop the itching and keep the rash from spreading.  Take or apply over-the-counter and prescription medicines only as told by your health care provider.  Contact a health care provider if you have new or worsening symptoms.  Keep all follow-up visits as told by your health care provider. This is important. This information is not intended to replace advice given to you by your health care provider. Make sure you discuss any questions you have with your health care provider. Document Released: 01/16/2002 Document Revised: 05/20/2018 Document Reviewed: 08/30/2017 Elsevier Patient Education  2020 Elsevier Inc.  

## 2019-02-01 ENCOUNTER — Other Ambulatory Visit: Payer: Self-pay | Admitting: Physician Assistant

## 2019-02-01 DIAGNOSIS — R21 Rash and other nonspecific skin eruption: Secondary | ICD-10-CM

## 2019-02-05 ENCOUNTER — Other Ambulatory Visit: Payer: Self-pay | Admitting: Physician Assistant

## 2019-02-05 DIAGNOSIS — R21 Rash and other nonspecific skin eruption: Secondary | ICD-10-CM

## 2019-02-07 NOTE — Telephone Encounter (Signed)
From [facility] 

## 2019-02-15 DIAGNOSIS — D485 Neoplasm of uncertain behavior of skin: Secondary | ICD-10-CM | POA: Diagnosis not present

## 2019-02-15 DIAGNOSIS — L309 Dermatitis, unspecified: Secondary | ICD-10-CM | POA: Diagnosis not present

## 2019-02-15 DIAGNOSIS — L739 Follicular disorder, unspecified: Secondary | ICD-10-CM | POA: Diagnosis not present

## 2019-02-15 DIAGNOSIS — L308 Other specified dermatitis: Secondary | ICD-10-CM | POA: Diagnosis not present

## 2019-02-15 DIAGNOSIS — L570 Actinic keratosis: Secondary | ICD-10-CM | POA: Diagnosis not present

## 2019-02-22 DIAGNOSIS — L299 Pruritus, unspecified: Secondary | ICD-10-CM | POA: Diagnosis not present

## 2019-02-22 DIAGNOSIS — L209 Atopic dermatitis, unspecified: Secondary | ICD-10-CM | POA: Diagnosis not present

## 2019-02-22 DIAGNOSIS — L739 Follicular disorder, unspecified: Secondary | ICD-10-CM | POA: Diagnosis not present

## 2019-04-07 DIAGNOSIS — M13841 Other specified arthritis, right hand: Secondary | ICD-10-CM | POA: Diagnosis not present

## 2019-04-07 DIAGNOSIS — M13842 Other specified arthritis, left hand: Secondary | ICD-10-CM | POA: Diagnosis not present

## 2019-04-13 DIAGNOSIS — L739 Follicular disorder, unspecified: Secondary | ICD-10-CM | POA: Diagnosis not present

## 2019-04-13 DIAGNOSIS — L299 Pruritus, unspecified: Secondary | ICD-10-CM | POA: Diagnosis not present

## 2019-04-13 DIAGNOSIS — L209 Atopic dermatitis, unspecified: Secondary | ICD-10-CM | POA: Diagnosis not present

## 2019-05-24 ENCOUNTER — Ambulatory Visit: Payer: No Typology Code available for payment source | Admitting: Dermatology

## 2019-06-01 ENCOUNTER — Other Ambulatory Visit: Payer: Self-pay | Admitting: Dermatology

## 2019-06-04 ENCOUNTER — Other Ambulatory Visit: Payer: Self-pay | Admitting: Physician Assistant
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
## 2019-06-09 ENCOUNTER — Encounter: Payer: Self-pay | Admitting: Physician Assistant

## 2019-06-09 ENCOUNTER — Ambulatory Visit (INDEPENDENT_AMBULATORY_CARE_PROVIDER_SITE_OTHER): Payer: PPO | Admitting: Physician Assistant

## 2019-06-09 ENCOUNTER — Other Ambulatory Visit: Payer: Self-pay

## 2019-06-09 VITALS — BP 126/79 | HR 87 | Temp 97.2°F | Resp 16 | Ht 71.0 in | Wt 190.8 lb

## 2019-06-09 DIAGNOSIS — H1013 Acute atopic conjunctivitis, bilateral: Secondary | ICD-10-CM | POA: Diagnosis not present

## 2019-06-09 DIAGNOSIS — F419 Anxiety disorder, unspecified: Secondary | ICD-10-CM | POA: Diagnosis not present

## 2019-06-09 MED ORDER — VENLAFAXINE HCL ER 150 MG PO CP24: 150.0000 mg | ORAL_CAPSULE | Freq: Every day | ORAL | 3 refills | Status: DC

## 2019-06-09 MED ORDER — AZELASTINE HCL 0.05 % OP SOLN: 1.0000 [drp] | Freq: Two times a day (BID) | OPHTHALMIC | 12 refills | Status: DC

## 2019-06-09 MED ORDER — SILVER SULFADIAZINE 1 % EX CREA: 1.0000 "application " | TOPICAL_CREAM | Freq: Every day | CUTANEOUS | 1 refills | Status: DC

## 2019-06-09 NOTE — Progress Notes (Signed)
Established patient visit   Patient: Edward Rodriguez   DOB: [birth year]   79 y.o. Male  MRN: [id number] Visit Date: 06/09/2019  Today's healthcare provider: Mar Daring, PA-C   Chief Complaint  Patient presents with  . Follow-up   Subjective    HPI Follow up for Anxiety  The patient was last seen for this for a few month ago. Changes made at last visit include none. Reports that he is stable.  He reports excellent compliance with treatment. He feels that condition is Improved. He is not having side effects. ----------------------------------------------------------------------------------------- GAD 7 : Generalized Anxiety Score 06/09/2019  Nervous, Anxious, on Edge 0  Control/stop worrying 1  Worry too much - different things 0  Trouble relaxing 2  Restless 3  Easily annoyed or irritable 0  Afraid - awful might happen 0  Total GAD 7 Score 6  Anxiety Difficulty Somewhat difficult     Patient Active Problem List   Diagnosis Date Noted  . Disorder of bursae of shoulder region 03/29/2017  . Localized primary carpometacarpal osteoarthrosis 08/31/2016  . Trochanteric bursitis 08/31/2016  . Osteoarthritis 08/09/2015  . Anxiety 07/16/2014  . Acid reflux 07/16/2014  . Buzzing in ear 07/16/2014  . S/P rotator cuff repair 06/27/2014  . Leg varices 09/06/2009  . Allergic rhinitis 06/17/2007  . ED (erectile dysfunction) of organic origin 03/01/2007   Past Medical History:  Diagnosis Date  . Actinic keratosis   . Anxiety   . GERD (gastroesophageal reflux disease)   . Vertigo 06/2014   had an episode in 2013.       Medications: Outpatient Medications Prior to Visit  Medication Sig  . ketoconazole (NIZORAL) 2 % shampoo SHAMPOO WITH A SMALL AMOUNT AS DIRECTED UP TO THREE TIMES PER WEEK  . meloxicam (MOBIC) 15 MG tablet   . omeprazole (PRILOSEC) 20 MG capsule Take 40 mg by mouth every other day.  . ondansetron (ZOFRAN) 4 MG tablet Take 1 tablet (4 mg total) by  mouth every 8 (eight) hours as needed.  . pramoxine-hydrocortisone (PROCTOCREAM-HC) 1-1 % rectal cream Place rectally 3 (three) times daily. As needed for hemorrhoids  . sildenafil (REVATIO) 20 MG tablet TAKE 2-5 TABLETS BY MOUTH ONCE DAILY AS NEEDED FOR ERECTILE DYSFUNCTION  . triamcinolone cream (KENALOG) 0.1 % APPLY  CREAM EXTERNALLY TO AFFECTED AREA TWICE DAILY RASH  OR  ITCHING  AVOID  FACE,  GROIN  AND  AXILLA  . venlafaxine XR (EFFEXOR-XR) 150 MG 24 hr capsule Take 1 capsule by mouth in the morning  . clindamycin (CLEOCIN T) 1 % external solution   . predniSONE (DELTASONE) 20 MG tablet Take 1 tablet (20 mg total) by mouth daily with breakfast.   Facility-Administered Medications Prior to Visit  Medication Dose Route Frequency Provider  . betamethasone acetate-betamethasone sodium phosphate (CELESTONE) injection 3 mg  3 mg Intramuscular Once Daylene Katayama M, DPM  . betamethasone acetate-betamethasone sodium phosphate (CELESTONE) injection 3 mg  3 mg Intramuscular Once Daylene Katayama M, DPM  . betamethasone acetate-betamethasone sodium phosphate (CELESTONE) injection 3 mg  3 mg Intramuscular Once Daylene Katayama M, DPM  . betamethasone acetate-betamethasone sodium phosphate (CELESTONE) injection 3 mg  3 mg Intramuscular Once Daylene Katayama M, DPM  . betamethasone acetate-betamethasone sodium phosphate (CELESTONE) injection 3 mg  3 mg Intramuscular Once Daylene Katayama M, DPM  . betamethasone acetate-betamethasone sodium phosphate (CELESTONE) injection 3 mg  3 mg Intramuscular Once Edrick Kins, DPM    Review of Systems  Last  CBC Lab Results  Component Value Date   WBC 7.5 05/09/2018   HGB 14.2 05/09/2018   HCT 40.8 05/09/2018   MCV 92 05/09/2018   MCH 31.8 05/09/2018   RDW 12.8 05/09/2018   PLT 246 [id number]   Last metabolic panel Lab Results  Component Value Date   GLUCOSE 96 05/09/2018   NA 145 (H) 05/09/2018   K 3.7 05/09/2018   CL 106 05/09/2018   CO2 23 05/09/2018   BUN 23  05/09/2018   CREATININE 1.14 05/09/2018   GFRNONAA 62 05/09/2018   GFRAA 71 05/09/2018   CALCIUM 9.6 05/09/2018   PHOS 3.1 05/09/2018   PROT 6.2 08/20/2015   ALBUMIN 4.2 05/09/2018   LABGLOB 2.2 08/20/2015   AGRATIO 1.8 08/20/2015   BILITOT 0.4 08/20/2015   ALKPHOS 65 08/20/2015   AST 13 08/20/2015   ALT 13 08/20/2015   ANIONGAP 5 06/19/2014      Objective    BP 126/79 (BP Location: Left Arm, Patient Position: Sitting, Cuff Size: Large)   Pulse 87   Temp (!) 97.2 F (36.2 C) (Temporal)   Resp 16   Ht 5\' 11"  (1.803 m)   Wt 190 lb 12.8 oz (86.5 kg)   BMI 26.61 kg/m  BP Readings from Last 3 Encounters:  06/09/19 126/79  08/26/18 125/77  06/07/18 130/76   Wt Readings from Last 3 Encounters:  06/09/19 190 lb 12.8 oz (86.5 kg)  08/26/18 194 lb (88 kg)  06/07/18 198 lb (89.8 kg)      Physical Exam Vitals reviewed.  Constitutional:      General: He is not in acute distress.    Appearance: Normal appearance. He is well-developed and normal weight. He is not ill-appearing or diaphoretic.  HENT:     Head: Normocephalic and atraumatic.  Neck:     Thyroid: No thyromegaly.     Vascular: No JVD.     Trachea: No tracheal deviation.  Cardiovascular:     Rate and Rhythm: Normal rate and regular rhythm.     Pulses: Normal pulses.     Heart sounds: Normal heart sounds. No murmur. No friction rub. No gallop.   Pulmonary:     Effort: Pulmonary effort is normal. No respiratory distress.     Breath sounds: Normal breath sounds. No wheezing or rales.  Musculoskeletal:     Cervical back: Normal range of motion and neck supple.     Right lower leg: No edema.     Left lower leg: No edema.  Lymphadenopathy:     Cervical: No cervical adenopathy.  Skin:    Capillary Refill: Capillary refill takes less than 2 seconds.  Neurological:     General: No focal deficit present.     Mental Status: He is alert. Mental status is at baseline.  Psychiatric:        Mood and Affect: Mood  normal.       No results found for any visits on 06/09/19.  Assessment & Plan     1. Allergic conjunctivitis of both eyes Will add optivar as below for allergy symptoms in his eyes.  - azelastine (OPTIVAR) 0.05 % ophthalmic solution; Place 1 drop into both eyes 2 (two) times daily.  Dispense: 6 mL; Refill: 12  2. Anxiety Stable. Diagnosis pulled for medication refill. Continue current medical treatment plan. - venlafaxine XR (EFFEXOR-XR) 150 MG 24 hr capsule; Take 1 capsule (150 mg total) by mouth daily with breakfast.  Dispense: 90 capsule; Refill: 3  No follow-ups on file.      Reynolds Bowl, PA-C, have reviewed all documentation for this visit. The documentation on 06/12/19 for the exam, diagnosis, procedures, and orders are all accurate and complete.   Rubye Beach  [facility] [phone] (phone) [phone] (fax)  [place]

## 2019-06-12 ENCOUNTER — Encounter: Payer: Self-pay | Admitting: Physician Assistant

## 2019-06-12 ENCOUNTER — Other Ambulatory Visit: Payer: Self-pay | Admitting: Dermatology

## 2019-06-14 ENCOUNTER — Telehealth: Payer: Self-pay | Admitting: Physician Assistant

## 2019-06-14 NOTE — Telephone Encounter (Signed)
Left message for patient to schedule Annual Wellness Visit.  Please schedule with Nurse Health Advisor KASEY UTHUS, RN.   

## 2019-06-14 NOTE — Telephone Encounter (Signed)
Left message for patient to schedule Annual Wellness Visit. .Please schedule with Nurse Health Advisor McKenzie Markoski, RN. ° °

## 2019-06-15 NOTE — Telephone Encounter (Signed)
Patient called back and said he will wait on the AWV appt.  He will call back when he is ready to schedule.

## 2019-06-15 NOTE — Telephone Encounter (Signed)
Not a pt here at [facility], please forward

## 2019-07-12 DIAGNOSIS — M1612 Unilateral primary osteoarthritis, left hip: Secondary | ICD-10-CM | POA: Diagnosis not present

## 2019-07-12 DIAGNOSIS — M25552 Pain in left hip: Secondary | ICD-10-CM | POA: Diagnosis not present

## 2019-07-19 ENCOUNTER — Other Ambulatory Visit: Payer: Self-pay | Admitting: Physician Assistant

## 2019-07-19 DIAGNOSIS — N529 Male erectile dysfunction, unspecified: Secondary | ICD-10-CM

## 2019-07-19 NOTE — Telephone Encounter (Signed)
Requested Prescriptions  Pending Prescriptions Disp Refills  . sildenafil (REVATIO) 20 MG tablet [Pharmacy Med Name: Sildenafil Citrate 20 MG Oral Tablet] 50 tablet 0    Sig: TAKE 2 TO 5 TABLETS BY MOUTH ONCE DAILY AS NEEDED FOR ERECTILE DYSFUNCTION     Urology: Erectile Dysfunction Agents Passed - 07/19/2019  1:15 PM      Passed - Last BP in normal range    BP Readings from Last 1 Encounters:  06/09/19 126/79         Passed - Valid encounter within last 12 months    Recent Outpatient Visits          1 month ago Allergic conjunctivitis of both eyes   [place], [place]   5 months ago Rash and nonspecific skin eruption   [facility] [place], [place], [place]   10 months ago Parotitis   [facility] [place], [place], [place]   1 year ago Cellulitis of right lower extremity   [place], [place]   1 year ago Acute cystitis without hematuria   [facility] [place], [place], [place]      Future Appointments            In 2 months Ralene Bathe, MD [place]

## 2019-07-28 DIAGNOSIS — M180 Bilateral primary osteoarthritis of first carpometacarpal joints: Secondary | ICD-10-CM | POA: Diagnosis not present

## 2019-08-18 ENCOUNTER — Other Ambulatory Visit: Payer: Self-pay | Admitting: Physician Assistant

## 2019-08-18 DIAGNOSIS — N529 Male erectile dysfunction, unspecified: Secondary | ICD-10-CM

## 2019-09-04 ENCOUNTER — Telehealth: Payer: Self-pay | Admitting: Physician Assistant

## 2019-09-04 NOTE — Telephone Encounter (Signed)
Copied from [place] [phone]. Topic: Medicare AWV >> Sep 04, 2019  3:16 PM Cher Nakai R wrote: Reason for CRM:  Left message for patient to call back and schedule Medicare Annual Wellness Visit (AWV) either virtually or in office.  Last AWV  [phone]  Please schedule at anytime with [facility].

## 2019-09-18 NOTE — Progress Notes (Signed)
Subjective:   Edward Rodriguez is a 79 y.o. male who presents for Medicare Annual/Subsequent preventive examination.  I connected with [name] today by telephone and verified that I am speaking with the correct person using two identifiers. Location patient: home Location provider: work Persons participating in the virtual visit: patient, provider.   I discussed the limitations, risks, security and privacy concerns of performing an evaluation and management service by telephone and the availability of in person appointments. I also discussed with the patient that there may be a patient responsible charge related to this service. The patient expressed understanding and verbally consented to this telephonic visit.    Interactive audio and video telecommunications were attempted between this provider and patient, however failed, due to patient having technical difficulties OR patient did not have access to video capability.  We continued and completed visit with audio only.   Review of Systems    NA  Cardiac Risk Factors include: advanced age (>77men, >13 women);male gender     Objective:    Today's Vitals   09/19/19 0852  PainSc: 1    There is no height or weight on file to calculate BMI.  Advanced Directives 09/19/2019 07/16/2014 06/27/2014 06/19/2014  Does Patient Have a Medical Advance Directive? Yes Yes Yes Yes  Type of Paramedic of [place];Living will Living will [place] will  Does patient want to make changes to medical advance directive? - No - Patient declined - No - Patient declined  Copy of [place] in Chart? No - copy requested No - copy requested No - copy requested No - copy requested  Would patient like information on creating a medical advance directive? - - No - patient declined information -    Current Medications (verified) Outpatient Encounter Medications as of 09/19/2019  Medication Sig  .  clindamycin (CLEOCIN T) 1 % external solution APPLY TO THE AFFECTED AREAS OF SCALP AT BEDTIME (Patient taking differently: As needed)  . ketoconazole (NIZORAL) 2 % shampoo SHAMPOO WITH A SMALL AMOUNT AS DIRECTED UP TO THREE TIMES PER WEEK  . loratadine (CLARITIN) 10 MG tablet Take 10 mg by mouth daily.  . meloxicam (MOBIC) 15 MG tablet Take 15 mg by mouth daily.   Marland Kitchen omeprazole (PRILOSEC) 20 MG capsule Take 40 mg by mouth every other day.  . pramoxine-hydrocortisone (PROCTOCREAM-HC) 1-1 % rectal cream Place rectally 3 (three) times daily. As needed for hemorrhoids  . sildenafil (REVATIO) 20 MG tablet TAKE 2 TO 5 TABLETS BY MOUTH ONCE DAILY AS NEEDED FOR ERECTILE DYSFUNCTION  . venlafaxine XR (EFFEXOR-XR) 150 MG 24 hr capsule Take 1 capsule (150 mg total) by mouth daily with breakfast.  . azelastine (OPTIVAR) 0.05 % ophthalmic solution Place 1 drop into both eyes 2 (two) times daily. (Patient not taking: Reported on 09/19/2019)  . ondansetron (ZOFRAN) 4 MG tablet Take 1 tablet (4 mg total) by mouth every 8 (eight) hours as needed. (Patient not taking: Reported on 09/19/2019)  . silver sulfADIAZINE (SILVADENE) 1 % cream Apply 1 application topically daily. (Patient not taking: Reported on 09/19/2019)  . triamcinolone cream (KENALOG) 0.1 % APPLY  CREAM EXTERNALLY TO AFFECTED AREA TWICE DAILY RASH  OR  ITCHING  AVOID  FACE,  GROIN  AND  AXILLA (Patient not taking: Reported on 09/19/2019)   Facility-Administered Encounter Medications as of 09/19/2019  Medication  . betamethasone acetate-betamethasone sodium phosphate (CELESTONE) injection 3 mg  . betamethasone acetate-betamethasone sodium phosphate (CELESTONE) injection  3 mg  . betamethasone acetate-betamethasone sodium phosphate (CELESTONE) injection 3 mg  . betamethasone acetate-betamethasone sodium phosphate (CELESTONE) injection 3 mg  . betamethasone acetate-betamethasone sodium phosphate (CELESTONE) injection 3 mg  . betamethasone acetate-betamethasone  sodium phosphate (CELESTONE) injection 3 mg    Allergies (verified) Sulfa antibiotics and Sulfur   History: Past Medical History:  Diagnosis Date  . Actinic keratosis   . Anxiety   . GERD (gastroesophageal reflux disease)   . Vertigo 06/2014   had an episode in 2013.   Past Surgical History:  Procedure Laterality Date  . ROTATOR CUFF REPAIR Left 06/2014  . SHOULDER ARTHROSCOPY Left 06/27/2014   Procedure: ARTHROSCOPY SHOULDER- mini open rotator cuff repair;  Surgeon: Thornton Park, MD;  Location: [facility] ORS;  Service: Orthopedics;  Laterality: Left;  . SHOULDER OPEN ROTATOR CUFF REPAIR Right 2011   Family History  Problem Relation Age of Onset  . Kidney failure Mother        49s  . Dementia Father   . Heart Problems Sister   . Cancer Maternal Grandfather   . Cancer Paternal Grandmother   . Cancer Paternal Grandfather    Social History   Socioeconomic History  . Marital status: Married    Spouse name: Not on file  . Number of children: 2  . Years of education: Not on file  . Highest education level: Bachelor's degree (e.g., BA, AB, BS)  Occupational History  . Occupation: Drives a bus for [employer]  . Smoking status: Former Smoker    Quit date: 06/18/1968    Years since quitting: 51.2  . Smokeless tobacco: Never Used  Vaping Use  . Vaping Use: Never used  Substance and Sexual Activity  . Alcohol use: Yes    Alcohol/week: 4.0 standard drinks    Types: 4 Cans of beer per week    Comment: occasionally  . Drug use: No  . Sexual activity: Not on file  Other Topics Concern  . Not on file  Social History Narrative  . Not on file   Social Determinants of Health   Financial Resource Strain: Low Risk   . Difficulty of Paying Living Expenses: Not hard at all  Food Insecurity: No Food Insecurity  . Worried About Charity fundraiser in the Last Year: Never true  . Ran Out of Food in the Last Year: Never true  Transportation Needs: No Transportation Needs  .  Lack of Transportation (Medical): No  . Lack of Transportation (Non-Medical): No  Physical Activity: Insufficiently Active  . Days of Exercise per Week: 3 days  . Minutes of Exercise per Session: 40 min  Stress: No Stress Concern Present  . Feeling of Stress : Only a little  Social Connections: Moderately Isolated  . Frequency of Communication with Friends and Family: Never  . Frequency of Social Gatherings with Friends and Family: More than three times a week  . Attends Religious Services: Never  . Active Member of Clubs or Organizations: No  . Attends Archivist Meetings: Never  . Marital Status: Married    Tobacco Counseling Counseling given: Not Answered   Clinical Intake:  Pre-visit preparation completed: Yes  Pain : 0-10 Pain Score: 1  Pain Type: Chronic pain Pain Location: Hip Pain Orientation: Left Pain Descriptors / Indicators: Aching Pain Frequency: Intermittent Pain Relieving Factors: Gets injections and takes Meloxicam for pain.  Pain Relieving Factors: Gets injections and takes Meloxicam for pain.  Nutritional Risks: None Diabetes: No  How  often do you need to have someone help you when you read instructions, pamphlets, or other written materials from your doctor or pharmacy?: 1 - Never  Diabetic? No  Interpreter Needed?: No  Information entered by :: [facility], LPN   Activities of Daily Living In your present state of health, do you have any difficulty performing the following activities: 09/19/2019 06/09/2019  Hearing? Y Y  Comment Does not wear hearing aids. "sometimes"  Vision? N N  Difficulty concentrating or making decisions? N Y  Comment - "focusing"  Walking or climbing stairs? N N  Dressing or bathing? N N  Doing errands, shopping? N N  Preparing Food and eating ? N -  Using the Toilet? N -  In the past six months, have you accidently leaked urine? N -  Do you have problems with loss of bowel control? N -  Managing your  Medications? N -  Managing your Finances? N -  Housekeeping or managing your Housekeeping? N -  Some recent data might be hidden    Patient Care Team: Mar Daring, PA-C as PCP - General (Family Medicine) [place], [place] (Optometry)  Indicate any recent Medical Services you may have received from other than [facility] providers in the past year (date may be approximate).     Assessment:   This is a routine wellness examination for [employer].  Hearing/Vision screen No exam data present  Dietary issues and exercise activities discussed: Current Exercise Habits: Home exercise routine, Type of exercise: walking;stretching, Time (Minutes): 45, Frequency (Times/Week): 3, Weekly Exercise (Minutes/Week): 135, Intensity: Mild, Exercise limited by: orthopedic condition(s)  Goals    . Prevent falls     Recommend to remove any items from the home that may cause slips or trips.      Depression Screen PHQ 2/9 Scores 09/19/2019 02/11/2018 06/22/2017 08/09/2015 07/16/2014  PHQ - 2 Score 0 0 0 0 0  PHQ- 9 Score - - - 1 -  Exception Documentation - - - - Patient refusal    Fall Risk Fall Risk  09/19/2019 06/09/2019 02/11/2018 06/22/2017 03/29/2017  Falls in the past year? 1 1 0 Yes No  Number falls in past yr: 0 1 - 1 -  Injury with Fall? 1 1 - No -  Comment - went to urgent care - - -  Risk for fall due to : - - - - -  Risk for fall due to: Comment - - - - -  Follow up Falls prevention discussed Falls prevention discussed - - -    Any stairs in or around the home? No  If so, are there any without handrails? No  Home free of loose throw rugs in walkways, pet beds, electrical cords, etc? Yes  Adequate lighting in your home to reduce risk of falls? Yes   ASSISTIVE DEVICES UTILIZED TO PREVENT FALLS:  Life alert? No  Use of a cane, walker or w/c? No Grab bars in the bathroom? Yes  Shower chair or bench in shower? Yes  Elevated toilet seat or a handicapped toilet? No    Cognitive Function:  Declined today.        Immunizations Immunization History  Administered Date(s) Administered  . Influenza Split 12/01/2010  . Influenza, High Dose Seasonal PF 11/04/2013, 11/11/2015, 11/06/2016, 10/15/2018  . Influenza,inj,Quad PF,6+ Mos 10/22/2012  . Influenza-Unspecified 10/20/2017  . PFIZER SARS-COV-2 Vaccination 03/02/2019, 03/23/2019  . Pneumococcal Conjugate-13 07/13/2013  . Pneumococcal Polysaccharide-23 12/01/2010  . Td 08/31/2017  . Zoster 07/13/2013  TDAP status: Up to date Flu Vaccine status: Up to date Pneumococcal vaccine status: Up to date Covid-19 vaccine status: Completed vaccines  Qualifies for Shingles Vaccine? Yes   Zostavax completed Yes   Shingrix Completed?: No.    Education has been provided regarding the importance of this vaccine. Patient has been advised to call insurance company to determine out of pocket expense if they have not yet received this vaccine. Advised may also receive vaccine at local pharmacy or Health Dept. Verbalized acceptance and understanding.  Screening Tests Health Maintenance  Topic Date Due  . INFLUENZA VACCINE  09/10/2019  . TETANUS/TDAP  09/01/2027  . COVID-19 Vaccine  Completed  . PNA vac Low Risk Adult  Completed    Health Maintenance  Health Maintenance Due  Topic Date Due  . INFLUENZA VACCINE  09/10/2019    Colorectal cancer screening: No longer required.   Lung Cancer Screening: (Low Dose CT Chest recommended if Age 22-80 years, 30 pack-year currently smoking OR have quit w/in 15years.) does not qualify.   Additional Screening:  Vision Screening: Recommended annual ophthalmology exams for early detection of glaucoma and other disorders of the eye. Is the patient up to date with their annual eye exam?  Yes  Who is the provider or what is the name of the office in which the patient attends annual eye exams? [facility] If pt is not established with a provider, would they like to be referred to a  provider to establish care? No .   Dental Screening: Recommended annual dental exams for proper oral hygiene  Community Resource Referral / Chronic Care Management: CRR required this visit?  No   CCM required this visit?  No      Plan:     I have personally reviewed and noted the following in the patient's chart:   . Medical and social history . Use of alcohol, tobacco or illicit drugs  . Current medications and supplements . Functional ability and status . Nutritional status . Physical activity . Advanced directives . List of other physicians . Hospitalizations, surgeries, and ER visits in previous 12 months . Vitals . Screenings to include cognitive, depression, and falls . Referrals and appointments  In addition, I have reviewed and discussed with patient certain preventive protocols, quality metrics, and best practice recommendations. A written personalized care plan for preventive services as well as general preventive health recommendations were provided to patient.     Treyvin Glidden [place], [place]   [id number]/41/[id number]   Nurse Notes: None.

## 2019-09-19 ENCOUNTER — Other Ambulatory Visit: Payer: Self-pay

## 2019-09-19 ENCOUNTER — Ambulatory Visit (INDEPENDENT_AMBULATORY_CARE_PROVIDER_SITE_OTHER): Payer: PPO

## 2019-09-19 DIAGNOSIS — Z0000 Encounter for general adult medical examination without abnormal findings: Secondary | ICD-10-CM | POA: Diagnosis not present

## 2019-09-19 NOTE — Patient Instructions (Signed)
Edward Rodriguez , Thank you for taking time to come for your Medicare Wellness Visit. I appreciate your ongoing commitment to your health goals. Please review the following plan we discussed and let me know if I can assist you in the future.   Screening recommendations/referrals: Colonoscopy: No longer required.  Recommended yearly ophthalmology/optometry visit for glaucoma screening and checkup Recommended yearly dental visit for hygiene and checkup  Vaccinations: Influenza vaccine: Done 10/15/18 Pneumococcal vaccine: Completed series Tdap vaccine: Up to date, due 08/2027 Shingles vaccine: Shingrix discussed. Please contact your pharmacy for coverage information.     Advanced directives: Please bring a copy of your POA (Power of Attorney) and/or Living Will to your next appointment.   Conditions/risks identified: Fall risk preventatives discussed today.   Next appointment: 09/24/20 @ 9:00 AM for an AWV  Preventive Care 65 Years and Older, Male Preventive care refers to lifestyle choices and visits with your health care provider that can promote health and wellness. What does preventive care include?  A yearly physical exam. This is also called an annual well check.  Dental exams once or twice a year.  Routine eye exams. Ask your health care provider how often you should have your eyes checked.  Personal lifestyle choices, including:  Daily care of your teeth and gums.  Regular physical activity.  Eating a healthy diet.  Avoiding tobacco and drug use.  Limiting alcohol use.  Practicing safe sex.  Taking low doses of aspirin every day.  Taking vitamin and mineral supplements as recommended by your health care provider. What happens during an annual well check? The services and screenings done by your health care provider during your annual well check will depend on your age, overall health, lifestyle risk factors, and family history of disease. Counseling  Your health care  provider may ask you questions about your:  Alcohol use.  Tobacco use.  Drug use.  Emotional well-being.  Home and relationship well-being.  Sexual activity.  Eating habits.  History of falls.  Memory and ability to understand (cognition).  Work and work Statistician. Screening  You may have the following tests or measurements:  Height, weight, and BMI.  Blood pressure.  Lipid and cholesterol levels. These may be checked every 5 years, or more frequently if you are over 77 years old.  Skin check.  Lung cancer screening. You may have this screening every year starting at age 20 if you have a 30-pack-year history of smoking and currently smoke or have quit within the past 15 years.  Fecal occult blood test (FOBT) of the stool. You may have this test every year starting at age 57.  Flexible sigmoidoscopy or colonoscopy. You may have a sigmoidoscopy every 5 years or a colonoscopy every 10 years starting at age 81.  Prostate cancer screening. Recommendations will vary depending on your family history and other risks.  Hepatitis C blood test.  Hepatitis B blood test.  Sexually transmitted disease (STD) testing.  Diabetes screening. This is done by checking your blood sugar (glucose) after you have not eaten for a while (fasting). You may have this done every 1-3 years.  Abdominal aortic aneurysm (AAA) screening. You may need this if you are a current or former smoker.  Osteoporosis. You may be screened starting at age 29 if you are at high risk. Talk with your health care provider about your test results, treatment options, and if necessary, the need for more tests. Vaccines  Your health care provider may recommend certain vaccines,  such as:  Influenza vaccine. This is recommended every year.  Tetanus, diphtheria, and acellular pertussis (Tdap, Td) vaccine. You may need a Td booster every 10 years.  Zoster vaccine. You may need this after age 41.  Pneumococcal  13-valent conjugate (PCV13) vaccine. One dose is recommended after age 14.  Pneumococcal polysaccharide (PPSV23) vaccine. One dose is recommended after age 11. Talk to your health care provider about which screenings and vaccines you need and how often you need them. This information is not intended to replace advice given to you by your health care provider. Make sure you discuss any questions you have with your health care provider. Document Released: 02/22/2015 Document Revised: 10/16/2015 Document Reviewed: 11/27/2014 Elsevier Interactive Patient Education  2017 [place] Prevention in the Home Falls can cause injuries. They can happen to people of all ages. There are many things you can do to make your home safe and to help prevent falls. What can I do on the outside of my home?  Regularly fix the edges of walkways and driveways and fix any cracks.  Remove anything that might make you trip as you walk through a door, such as a raised step or threshold.  Trim any bushes or trees on the path to your home.  Use bright outdoor lighting.  Clear any walking paths of anything that might make someone trip, such as rocks or tools.  Regularly check to see if handrails are loose or broken. Make sure that both sides of any steps have handrails.  Any raised decks and porches should have guardrails on the edges.  Have any leaves, snow, or ice cleared regularly.  Use sand or salt on walking paths during winter.  Clean up any spills in your garage right away. This includes oil or grease spills. What can I do in the bathroom?  Use night lights.  Install grab bars by the toilet and in the tub and shower. Do not use towel bars as grab bars.  Use non-skid mats or decals in the tub or shower.  If you need to sit down in the shower, use a plastic, non-slip stool.  Keep the floor dry. Clean up any water that spills on the floor as soon as it happens.  Remove soap buildup in the  tub or shower regularly.  Attach bath mats securely with double-sided non-slip rug tape.  Do not have throw rugs and other things on the floor that can make you trip. What can I do in the bedroom?  Use night lights.  Make sure that you have a light by your bed that is easy to reach.  Do not use any sheets or blankets that are too big for your bed. They should not hang down onto the floor.  Have a firm chair that has side arms. You can use this for support while you get dressed.  Do not have throw rugs and other things on the floor that can make you trip. What can I do in the kitchen?  Clean up any spills right away.  Avoid walking on wet floors.  Keep items that you use a lot in easy-to-reach places.  If you need to reach something above you, use a strong step stool that has a grab bar.  Keep electrical cords out of the way.  Do not use floor polish or wax that makes floors slippery. If you must use wax, use non-skid floor wax.  Do not have throw rugs and other things on  the floor that can make you trip. What can I do with my stairs?  Do not leave any items on the stairs.  Make sure that there are handrails on both sides of the stairs and use them. Fix handrails that are broken or loose. Make sure that handrails are as long as the stairways.  Check any carpeting to make sure that it is firmly attached to the stairs. Fix any carpet that is loose or worn.  Avoid having throw rugs at the top or bottom of the stairs. If you do have throw rugs, attach them to the floor with carpet tape.  Make sure that you have a light switch at the top of the stairs and the bottom of the stairs. If you do not have them, ask someone to add them for you. What else can I do to help prevent falls?  Wear shoes that:  Do not have high heels.  Have rubber bottoms.  Are comfortable and fit you well.  Are closed at the toe. Do not wear sandals.  If you use a stepladder:  Make sure that it  is fully opened. Do not climb a closed stepladder.  Make sure that both sides of the stepladder are locked into place.  Ask someone to hold it for you, if possible.  Clearly mark and make sure that you can see:  Any grab bars or handrails.  First and last steps.  Where the edge of each step is.  Use tools that help you move around (mobility aids) if they are needed. These include:  Canes.  Walkers.  Scooters.  Crutches.  Turn on the lights when you go into a dark area. Replace any light bulbs as soon as they burn out.  Set up your furniture so you have a clear path. Avoid moving your furniture around.  If any of your floors are uneven, fix them.  If there are any pets around you, be aware of where they are.  Review your medicines with your doctor. Some medicines can make you feel dizzy. This can increase your chance of falling. Ask your doctor what other things that you can do to help prevent falls. This information is not intended to replace advice given to you by your health care provider. Make sure you discuss any questions you have with your health care provider. Document Released: 11/22/2008 Document Revised: 07/04/2015 Document Reviewed: 03/02/2014 Elsevier Interactive Patient Education  2017 [employer].

## 2019-09-26 ENCOUNTER — Ambulatory Visit: Payer: PPO | Admitting: Dermatology

## 2019-09-26 ENCOUNTER — Other Ambulatory Visit: Payer: Self-pay

## 2019-09-26 DIAGNOSIS — L2081 Atopic neurodermatitis: Secondary | ICD-10-CM

## 2019-09-26 DIAGNOSIS — L820 Inflamed seborrheic keratosis: Secondary | ICD-10-CM | POA: Diagnosis not present

## 2019-09-26 DIAGNOSIS — L578 Other skin changes due to chronic exposure to nonionizing radiation: Secondary | ICD-10-CM

## 2019-09-26 DIAGNOSIS — L570 Actinic keratosis: Secondary | ICD-10-CM | POA: Diagnosis not present

## 2019-09-26 DIAGNOSIS — L821 Other seborrheic keratosis: Secondary | ICD-10-CM

## 2019-09-26 DIAGNOSIS — L739 Follicular disorder, unspecified: Secondary | ICD-10-CM

## 2019-09-26 MED ORDER — EUCRISA 2 % EX OINT: 1.0000 "application " | TOPICAL_OINTMENT | CUTANEOUS | 3 refills | Status: DC

## 2019-09-26 NOTE — Progress Notes (Signed)
° °  Follow-Up Visit   Subjective  Edward Rodriguez is a 79 y.o. male who presents for the following: Atopic derm (trunk, buttocks, TMC 0.1% cr ~2I/NO) and folliculitis (scalp, Ketoconazole 2% shampoo 3-4x/wk).  The following portions of the chart were reviewed this encounter and updated as appropriate:  Tobacco   Allergies   Meds   Problems   Med Hx   Surg Hx   Fam Hx      Review of Systems:  No other skin or systemic complaints except as noted in HPI or Assessment and Plan.  Objective  Well appearing patient in no apparent distress; mood and affect are within normal limits.  A focused examination was performed including face, neck, chest and back and trunk, face, scalp, ears. Relevant physical exam findings are noted in the Assessment and Plan.  Objective  Right Flank: Clear today  Objective  L back x 2, R neck x 1 (3): Erythematous keratotic or waxy stuck-on papule or plaque.   Objective  face, ears, chest x 16 (16): Pink scaly macules    Assessment & Plan    Actinic Damage - diffuse scaly erythematous macules with underlying dyspigmentation - Recommend daily broad spectrum sunscreen SPF 30+ to sun-exposed areas, reapply every 2 hours as needed.  - Call for new or changing lesions.  Seborrheic Keratoses - Stuck-on, waxy, tan-brown papules and plaques  - Discussed benign etiology and prognosis. - Observe - Call for any changes  Atopic neurodermatitis Right Flank  Bx proven 02/15/2019  Controlled  Start Eucrisa oint qd/bid prn flares Cont TMC 0.1% cr qd up to 5d/wk prn more sever flares  Discussed Dupixent if worsens  Crisaborole (EUCRISA) 2 % OINT - Right Flank  Folliculitis Scalp   Cont Ketoconazole 2% shampoo  Inflamed seborrheic keratosis (3) L back x 2, R neck x 1  Destruction of lesion - L back x 2, R neck x 1 Complexity: simple   Destruction method: cryotherapy   Informed consent: discussed and consent obtained   Timeout:  patient name, date of  birth, surgical site, and procedure verified Lesion destroyed using liquid nitrogen: Yes   Region frozen until ice ball extended beyond lesion: Yes   Outcome: patient tolerated procedure well with no complications   Post-procedure details: wound care instructions given    AK (actinic keratosis) (16) face, ears, chest x 16  Destruction of lesion - face, ears, chest x 16 Complexity: simple   Destruction method: cryotherapy   Informed consent: discussed and consent obtained   Timeout:  patient name, date of birth, surgical site, and procedure verified Lesion destroyed using liquid nitrogen: Yes   Region frozen until ice ball extended beyond lesion: Yes   Outcome: patient tolerated procedure well with no complications   Post-procedure details: wound care instructions given    Return in about 3 months (around 12/27/2019) for AK, Atopic derm f/u.  I, Othelia Pulling, RMA, am acting as scribe for Sarina Ser, MD .  Documentation: I have reviewed the above documentation for accuracy and completeness, and I agree with the above.  Sarina Ser, MD

## 2019-09-27 ENCOUNTER — Encounter: Payer: Self-pay | Admitting: Dermatology

## 2019-10-06 DIAGNOSIS — M25552 Pain in left hip: Secondary | ICD-10-CM | POA: Diagnosis not present

## 2019-11-16 DIAGNOSIS — M180 Bilateral primary osteoarthritis of first carpometacarpal joints: Secondary | ICD-10-CM | POA: Diagnosis not present

## 2019-11-16 DIAGNOSIS — M13819 Other specified arthritis, unspecified shoulder: Secondary | ICD-10-CM | POA: Diagnosis not present

## 2019-12-04 ENCOUNTER — Other Ambulatory Visit: Payer: Self-pay | Admitting: Physician Assistant

## 2019-12-04 DIAGNOSIS — N529 Male erectile dysfunction, unspecified: Secondary | ICD-10-CM

## 2019-12-25 DIAGNOSIS — M25552 Pain in left hip: Secondary | ICD-10-CM | POA: Diagnosis not present

## 2019-12-25 DIAGNOSIS — M25551 Pain in right hip: Secondary | ICD-10-CM | POA: Diagnosis not present

## 2019-12-27 ENCOUNTER — Ambulatory Visit: Payer: PPO | Admitting: Dermatology

## 2019-12-27 ENCOUNTER — Other Ambulatory Visit: Payer: Self-pay

## 2019-12-27 DIAGNOSIS — D692 Other nonthrombocytopenic purpura: Secondary | ICD-10-CM

## 2019-12-27 DIAGNOSIS — L2081 Atopic neurodermatitis: Secondary | ICD-10-CM | POA: Diagnosis not present

## 2019-12-27 DIAGNOSIS — L820 Inflamed seborrheic keratosis: Secondary | ICD-10-CM | POA: Diagnosis not present

## 2019-12-27 DIAGNOSIS — L570 Actinic keratosis: Secondary | ICD-10-CM | POA: Diagnosis not present

## 2019-12-27 DIAGNOSIS — L578 Other skin changes due to chronic exposure to nonionizing radiation: Secondary | ICD-10-CM | POA: Diagnosis not present

## 2019-12-27 MED ORDER — DUPIXENT 300 MG/2ML ~~LOC~~ SOSY: 300.0000 mg | PREFILLED_SYRINGE | SUBCUTANEOUS | 6 refills | Status: DC

## 2019-12-27 MED ORDER — DUPILUMAB 300 MG/2ML ~~LOC~~ SOSY
600.0000 mg | PREFILLED_SYRINGE | Freq: Once | SUBCUTANEOUS | Status: AC
  Administered 2019-12-27: 600 mg via SUBCUTANEOUS

## 2019-12-27 MED ORDER — DUPILUMAB 300 MG/2ML ~~LOC~~ SOAJ
300.0000 mg | Freq: Once | SUBCUTANEOUS | Status: AC
  Administered 2020-01-16: 300 mg via SUBCUTANEOUS

## 2019-12-27 NOTE — Progress Notes (Signed)
Follow-Up Visit   Subjective  Edward Rodriguez is a 79 y.o. male who presents for the following: Rash (3 months f/u on atopic dermatitis treating with Eucrisa with a poor response and TMC cream) and Actinic Keratosis (3 months f/u aks on face ).  The following portions of the chart were reviewed this encounter and updated as appropriate:  Tobacco  Allergies  Meds  Problems  Med Hx  Surg Hx  Fam Hx     Review of Systems:  No other skin or systemic complaints except as noted in HPI or Assessment and Plan.  Objective  Well appearing patient in no apparent distress; mood and affect are within normal limits.  A focused examination was performed including face,chest,back,neck,arms,legs . Relevant physical exam findings are noted in the Assessment and Plan.  Objective  trunk: Pink patches with excoriations   Images          Objective  face, ears, neck (16): Erythematous thin papules/macules with gritty scale.   Objective  R nasal bridge: Erythematous keratotic or waxy stuck-on papule or plaque.    Assessment & Plan  Atopic neurodermatitis-with severe persistent pruritus and excoriations.  he has failed treatment with topical corticosteroids; Eucrisa topically and antihistamines.   Trunk Bx proven atopic dermatitis 02/15/2019  Patient tried and failed  Failed Eucrisa oint qd/bid prn flares Failed TMC 0.1% cream   We will try Dupixent injection   Dupixent 300 mg/2 ml injected at R lower abdomen and L lower abdomen, tolerated well  Lot [id number] Exp 02/2021  Dupixent [place] forms filled out today in the office   Atopic dermatitis (eczema) is a chronic, relapsing, pruritic condition that can significantly affect quality of life. It is often associated with allergic rhinitis and/or asthma and can require treatment with topical medications, phototherapy, or in severe cases a biologic medication called Dupixent.    Ordered Medications: Dupilumab SOPN 300 mg dupilumab  (DUPIXENT) 300 MG/2ML prefilled syringe  AK (actinic keratosis) (16) face, ears, neck  Destruction of lesion - face, ears, neck Complexity: simple   Destruction method: cryotherapy   Informed consent: discussed and consent obtained   Timeout:  patient name, date of birth, surgical site, and procedure verified Lesion destroyed using liquid nitrogen: Yes   Region frozen until ice ball extended beyond lesion: Yes   Outcome: patient tolerated procedure well with no complications   Post-procedure details: wound care instructions given    Inflamed seborrheic keratosis R nasal bridge  Destruction of lesion - R nasal bridge Complexity: simple   Destruction method: cryotherapy   Informed consent: discussed and consent obtained   Timeout:  patient name, date of birth, surgical site, and procedure verified Lesion destroyed using liquid nitrogen: Yes   Region frozen until ice ball extended beyond lesion: Yes   Outcome: patient tolerated procedure well with no complications   Post-procedure details: wound care instructions given   Actinic Damage - chronic, secondary to cumulative UV radiation exposure/sun exposure over time - diffuse scaly erythematous macules with underlying dyspigmentation - Recommend daily broad spectrum sunscreen SPF 30+ to sun-exposed areas, reapply every 2 hours as needed.  - Call for new or changing lesions.  Purpura - Chronic; persistent and recurrent.  Treatable, but not curable. - Violaceous macules and patches - Benign - Related to age, sun damage and/or use of blood thinners - Observe - Can use OTC arnica containing moisturizer such as Dermend Bruise Formula if desired - Call for worsening or other concerns  Return in about  2 weeks (around 01/10/2020) for [place] .  IMarye Round, CMA, am acting as scribe for Sarina Ser, MD .  Documentation: I have reviewed the above documentation for accuracy and completeness, and I agree with the above.  Sarina Ser, MD

## 2019-12-29 ENCOUNTER — Encounter: Payer: Self-pay | Admitting: Dermatology

## 2020-01-01 ENCOUNTER — Other Ambulatory Visit: Payer: Self-pay | Admitting: Physician Assistant

## 2020-01-01 DIAGNOSIS — M1612 Unilateral primary osteoarthritis, left hip: Secondary | ICD-10-CM | POA: Diagnosis not present

## 2020-01-01 NOTE — Telephone Encounter (Signed)
Requested medication (s) are due for refill today unsure  Requested medication (s) are on the active medication list -yes  Future visit scheduled -no  Last refill: 10/30/19  Notes to clinic: Request medication not assigned to protocol  Requested Prescriptions  Pending Prescriptions Disp Refills   SSD 1 % cream [Pharmacy Med Name: SSD 1 % External Cream] 50 g 0    Sig: APPLY 1 APPLICATION TOPICALLY DAILY.      Off-Protocol Failed - 01/01/2020  2:14 PM      Failed - Medication not assigned to a protocol, review manually.      Passed - Valid encounter within last 12 months    Recent Outpatient Visits           6 months ago Allergic conjunctivitis of both eyes   [place], [place]   11 months ago Rash and nonspecific skin eruption   [facility] [place], [place], [place]   1 year ago Parotitis   [place], [place], [place]   1 year ago Cellulitis of right lower extremity   [place], [place], [place]   1 year ago Acute cystitis without hematuria   [facility] [place], [place], [place]       Future Appointments             In 1 week Ralene Bathe, MD [place]                Requested Prescriptions  Pending Prescriptions Disp Refills   SSD 1 % cream [Pharmacy Med Name: SSD 1 % External Cream] 50 g 0    Sig: APPLY 1 APPLICATION TOPICALLY DAILY.      Off-Protocol Failed - 01/01/2020  2:14 PM      Failed - Medication not assigned to a protocol, review manually.      Passed - Valid encounter within last 12 months    Recent Outpatient Visits           6 months ago Allergic conjunctivitis of both eyes   [place], [place]   11 months ago Rash and nonspecific skin eruption   [facility] [place], [place], [place]   1 year ago Parotitis   [place], [place], [place]   1 year ago Cellulitis of right lower extremity   [place], [place]   1 year ago Acute cystitis without hematuria   [facility], [place], [place]       Future Appointments             In 1 week Ralene Bathe, MD [place]

## 2020-01-03 DIAGNOSIS — H43813 Vitreous degeneration, bilateral: Secondary | ICD-10-CM | POA: Diagnosis not present

## 2020-01-11 ENCOUNTER — Ambulatory Visit: Payer: PPO | Admitting: Dermatology

## 2020-01-16 ENCOUNTER — Other Ambulatory Visit: Payer: Self-pay

## 2020-01-16 ENCOUNTER — Ambulatory Visit: Payer: PPO | Admitting: Dermatology

## 2020-01-16 ENCOUNTER — Telehealth: Payer: Self-pay

## 2020-01-16 DIAGNOSIS — L2081 Atopic neurodermatitis: Secondary | ICD-10-CM

## 2020-01-16 DIAGNOSIS — L739 Follicular disorder, unspecified: Secondary | ICD-10-CM | POA: Diagnosis not present

## 2020-01-16 MED ORDER — CICLOPIROX 1 % EX SHAM: MEDICATED_SHAMPOO | CUTANEOUS | 2 refills | Status: DC

## 2020-01-16 MED ORDER — DOXYCYCLINE HYCLATE 100 MG PO CAPS: 100.0000 mg | ORAL_CAPSULE | Freq: Every day | ORAL | 1 refills | Status: AC

## 2020-01-16 MED ORDER — CLINDAMYCIN PHOSPHATE 1 % EX SOLN
CUTANEOUS | 2 refills | Status: DC
Start: 2020-01-16 — End: 2020-09-05

## 2020-01-16 MED ORDER — DUPILUMAB 300 MG/2ML ~~LOC~~ SOAJ
300.0000 mg | Freq: Once | SUBCUTANEOUS | Status: AC
  Administered 2020-03-26: 300 mg via SUBCUTANEOUS

## 2020-01-16 MED ORDER — TRIAMCINOLONE ACETONIDE 0.1 % EX CREA
TOPICAL_CREAM | CUTANEOUS | 2 refills | Status: DC
Start: 2020-01-16 — End: 2022-07-14

## 2020-01-16 NOTE — Telephone Encounter (Signed)
Left message for patient to return my call.

## 2020-01-16 NOTE — Patient Instructions (Signed)
Dupilumab (Dupixent) is a treatment given by injection for adults with moderate-to-severe atopic dermatitis. Goal is control of skin condition, not cure. It is given as 2 injections at the first dose followed by 1 injection ever 2 weeks thereafter.  Potential side effects include allergic reaction, herpes infections, injection site reactions and conjunctivitis (inflammation of the eyes).  The use of Dupixent requires long term medication management, including periodic office visits.   Topical steroids (such as triamcinolone, fluocinolone, fluocinonide, mometasone, clobetasol, halobetasol, betamethasone, hydrocortisone) can cause thinning and lightening of the skin if they are used for too long in the same area. Your physician has selected the right strength medicine for your problem and area affected on the body. Please use your medication only as directed by your physician to prevent side effects.    Doxycycline should be taken with food to prevent nausea. Do not lay down for 30 minutes after taking. Be cautious with sun exposure and use good sun protection while on this medication. Pregnant women should not take this medication.

## 2020-01-16 NOTE — Telephone Encounter (Signed)
If he wants to time it in between [place] shots, he could do that.  The other option is that he skips a Dupixent shot and gets his Covid booster instead at the two week interval.

## 2020-01-16 NOTE — Telephone Encounter (Signed)
Patient called because he forgot to ask regarding his COVID booster vaccine. When will be the best time for him to get this?

## 2020-01-16 NOTE — Progress Notes (Signed)
   Follow-Up Visit   Subjective  Edward Rodriguez is a 79 y.o. male who presents for the following: Dermatitis (flank, back. 2 week follow-up. Patient had loading dose 12/27/19. He has seen improvement already, with decreased itch.). He gets scalp bumps on a regular basis.  Uses Clindamycin solution which helps them clear up, but not prevent new ones.   The following portions of the chart were reviewed this encounter and updated as appropriate:      Review of Systems:  No other skin or systemic complaints except as noted in HPI or Assessment and Plan.  Objective  Well appearing patient in no apparent distress; mood and affect are within normal limits.  A focused examination was performed including face, scalp, trunk. Relevant physical exam findings are noted in the Assessment and Plan.  Objective  Mid Back: Violaceous scaly papules on right flank; hyperpigmented and hypopigmented patches on left flank, some with erythema.  Objective  Scalp: Resolving follicular papules.   Assessment & Plan  Atopic neurodermatitis Mid Back  With hx of with severe persistent pruritus and excoriations.   Improving on Dupixent.  He has failed treatment with topical corticosteroids; Eucrisa topically and antihistamines.   Bx proven atopic dermatitis 02/15/2019  Continue TMC 0.1% Cream qd/bid AAs prn. Avoid face, groin, axilla.  Continue Dupixent injections q 2 wks. Paperwork for patient assistance has been sent in. Dupixent 300mg /mL (sample) injected to left abdomen today. Patient tolerated well.   Dupilumab (Dupixent) is a treatment given by injection for adults with moderate-to-severe atopic dermatitis. Goal is control of skin condition, not cure. It is given as 2 injections at the first dose followed by 1 injection ever 2 weeks thereafter.  Potential side effects include allergic reaction, herpes infections, injection site reactions and conjunctivitis (inflammation of the eyes).  The use of Dupixent  requires long term medication management, including periodic office visits.  Topical steroids (such as triamcinolone, fluocinolone, fluocinonide, mometasone, clobetasol, halobetasol, betamethasone, hydrocortisone) can cause thinning and lightening of the skin if they are used for too long in the same area. Your physician has selected the right strength medicine for your problem and area affected on the body. Please use your medication only as directed by your physician to prevent side effects.    Ordered Medications: Dupilumab SOPN 300 mg  Other Related Medications dupilumab (DUPIXENT) 300 MG/2ML prefilled syringe  Folliculitis Scalp  Scalp, Chronic condition, not at goal  Cont Clindamycin solution Apply to AAs scalp qhs prn 2Rf  Start doxycycline 100mg  take 1 po QD with food dsp #30 1Rf  Start Ciclopirox shampoo Massage into scalp QD as needed, let sit several minutes before rinsing dsp 159mL 2Rf.  Doxycycline should be taken with food to prevent nausea. Do not lay down for 30 minutes after taking. Be cautious with sun exposure and use good sun protection while on this medication. Pregnant women should not take this medication.    doxycycline (VIBRAMYCIN) 100 MG capsule - Scalp  Ciclopirox 1 % shampoo - Scalp  Return in about 2 weeks (around 01/30/2020) for Dupixent injections, nurse visit. Also f/u with Dr Chauncey Cruel in 3 mos.Lindi Adie, CMA, am acting as scribe for Brendolyn Patty, MD .  Documentation: I have reviewed the above documentation for accuracy and completeness, and I agree with the above.  Brendolyn Patty MD

## 2020-01-17 NOTE — Telephone Encounter (Signed)
Patient advised of information per Dr. Nicole Kindred.

## 2020-01-30 ENCOUNTER — Ambulatory Visit: Payer: PPO

## 2020-01-30 ENCOUNTER — Other Ambulatory Visit: Payer: Self-pay

## 2020-01-30 DIAGNOSIS — L209 Atopic dermatitis, unspecified: Secondary | ICD-10-CM | POA: Diagnosis not present

## 2020-01-30 MED ORDER — DUPILUMAB 300 MG/2ML ~~LOC~~ SOSY
300.0000 mg | PREFILLED_SYRINGE | Freq: Once | SUBCUTANEOUS | Status: AC
  Administered 2020-01-30: 10:00:00 300 mg via SUBCUTANEOUS

## 2020-01-30 NOTE — Progress Notes (Signed)
Patient here for two week Dupixent injection. Dupixent 300mg/2mL injected into right upper arm. Patient tolerated well.  LOT: [id number] EXP: 02/2022 

## 2020-02-05 ENCOUNTER — Other Ambulatory Visit: Payer: Self-pay | Admitting: Physician Assistant

## 2020-02-05 DIAGNOSIS — N529 Male erectile dysfunction, unspecified: Secondary | ICD-10-CM

## 2020-02-05 NOTE — Telephone Encounter (Signed)
Requested Prescriptions  Pending Prescriptions Disp Refills  . sildenafil (REVATIO) 20 MG tablet [Pharmacy Med Name: Sildenafil Citrate 20 MG Oral Tablet] 50 tablet 0    Sig: TAKE 2 TO 5 TABLETS BY MOUTH ONCE DAILY AS NEEDED FOR ERECTILE DYSFUNCTION     Urology: Erectile Dysfunction Agents Passed - 02/05/2020  9:57 AM      Passed - Last BP in normal range    BP Readings from Last 1 Encounters:  06/09/19 126/79         Passed - Valid encounter within last 12 months    Recent Outpatient Visits          8 months ago Allergic conjunctivitis of both eyes   [facility] [place], [place], [place]   1 year ago Rash and nonspecific skin eruption   [facility] [place], [place], [place]   1 year ago Parotitis   [facility] [place], [place], [place]   1 year ago Cellulitis of right lower extremity   [facility] [place], [place], [place]   1 year ago Acute cystitis without hematuria   [facility] [place], [place], [place]      Future Appointments            In 2 months Willeen Niece, MD [facility]

## 2020-02-13 ENCOUNTER — Ambulatory Visit: Payer: PPO

## 2020-02-13 ENCOUNTER — Other Ambulatory Visit: Payer: Self-pay

## 2020-02-13 DIAGNOSIS — L209 Atopic dermatitis, unspecified: Secondary | ICD-10-CM

## 2020-02-13 MED ORDER — DUPILUMAB 300 MG/2ML ~~LOC~~ SOSY
300.0000 mg | PREFILLED_SYRINGE | Freq: Once | SUBCUTANEOUS | Status: AC
Start: 2020-02-13 — End: 2020-02-13
  Administered 2020-02-13: 300 mg via SUBCUTANEOUS

## 2020-02-13 NOTE — Progress Notes (Signed)
Patient here for two week Dupixent injection.   Dupixent 300mg /43mL injected into patients left lower abdomen. Patient tolerated well.  LOT: [id number] EXP: 02/09/2022

## 2020-02-27 ENCOUNTER — Other Ambulatory Visit: Payer: Self-pay

## 2020-02-27 ENCOUNTER — Ambulatory Visit: Payer: PPO

## 2020-02-27 DIAGNOSIS — L209 Atopic dermatitis, unspecified: Secondary | ICD-10-CM

## 2020-02-27 MED ORDER — DUPILUMAB 300 MG/2ML ~~LOC~~ SOSY
300.0000 mg | PREFILLED_SYRINGE | Freq: Once | SUBCUTANEOUS | Status: AC
  Administered 2020-02-27: 300 mg via SUBCUTANEOUS

## 2020-02-27 NOTE — Progress Notes (Signed)
Patient here for two week Dupixent injection.   Dupixent 300mg /74mL injected into right lower abdomen. Patient tolerated well.   LOT: [id number] EXP: 02/2022

## 2020-03-07 DIAGNOSIS — M180 Bilateral primary osteoarthritis of first carpometacarpal joints: Secondary | ICD-10-CM | POA: Diagnosis not present

## 2020-03-12 ENCOUNTER — Ambulatory Visit: Payer: PPO

## 2020-03-12 ENCOUNTER — Other Ambulatory Visit: Payer: Self-pay

## 2020-03-12 DIAGNOSIS — L209 Atopic dermatitis, unspecified: Secondary | ICD-10-CM

## 2020-03-12 MED ORDER — DUPILUMAB 300 MG/2ML ~~LOC~~ SOSY
300.0000 mg | PREFILLED_SYRINGE | Freq: Once | SUBCUTANEOUS | Status: AC
  Administered 2020-03-12: 300 mg via SUBCUTANEOUS

## 2020-03-12 NOTE — Progress Notes (Signed)
Patient here for two week Dupixent injection.   Dupixent 300mg /20mL injected SQ into left lower abdomen. Patient tolerated well.   LOT: [id number] EXP: 02/2022

## 2020-03-14 ENCOUNTER — Telehealth: Payer: Self-pay

## 2020-03-14 NOTE — Telephone Encounter (Signed)
Patient has been denied for patient assistance for [place] [employer] [place].

## 2020-03-18 NOTE — Telephone Encounter (Signed)
I have called and left a message with our [facility] for [place] to found out our options.

## 2020-03-18 NOTE — Telephone Encounter (Signed)
Does he have any options for continuing treatment, if he can't afford it?

## 2020-03-20 NOTE — Telephone Encounter (Signed)
Spoke with our Financial controller for [employer]. Patient has to apply for [place] through CMS. Phone numbers given we [phone] and the [place] is [phone].   If patient qualifies all prescriptions patient is on will be a flat fee through [employer]. If patient does not qualify then we will send the denial to [place] [employer] so patient can be enrolled into patient assistance.

## 2020-03-26 ENCOUNTER — Other Ambulatory Visit: Payer: Self-pay

## 2020-03-26 ENCOUNTER — Ambulatory Visit: Payer: PPO

## 2020-03-26 DIAGNOSIS — L209 Atopic dermatitis, unspecified: Secondary | ICD-10-CM

## 2020-03-26 NOTE — Progress Notes (Signed)
Patient here for two week Dupixent injection.  Dupixent 300mg /42mL injected into patients left upper arm. Patient tolerated well.   LOT: [id number] EXP: 02/2022

## 2020-04-09 ENCOUNTER — Ambulatory Visit: Payer: PPO

## 2020-04-11 ENCOUNTER — Other Ambulatory Visit: Payer: Self-pay

## 2020-04-11 ENCOUNTER — Ambulatory Visit: Payer: PPO

## 2020-04-11 DIAGNOSIS — L209 Atopic dermatitis, unspecified: Secondary | ICD-10-CM | POA: Diagnosis not present

## 2020-04-11 MED ORDER — DUPILUMAB 300 MG/2ML ~~LOC~~ SOSY
300.0000 mg | PREFILLED_SYRINGE | Freq: Once | SUBCUTANEOUS | Status: AC
  Administered 2020-04-11: 300 mg via SUBCUTANEOUS

## 2020-04-11 NOTE — Progress Notes (Signed)
Patient here for two week Dupixent injection.   Dupixent 300mg  injected into right lower abdomen. Patient tolerated well.  LOT: [id number] EXP: 02/2022

## 2020-04-16 ENCOUNTER — Ambulatory Visit: Payer: PPO | Admitting: Dermatology

## 2020-04-18 ENCOUNTER — Other Ambulatory Visit: Payer: Self-pay | Admitting: Physician Assistant

## 2020-04-18 DIAGNOSIS — N529 Male erectile dysfunction, unspecified: Secondary | ICD-10-CM

## 2020-04-18 NOTE — Telephone Encounter (Signed)
Requested Prescriptions  Pending Prescriptions Disp Refills  . sildenafil (REVATIO) 20 MG tablet [Pharmacy Med Name: Sildenafil Citrate 20 MG Oral Tablet] 50 tablet 0    Sig: TAKE 2 TO 5 TABLETS BY MOUTH ONCE DAILY AS NEEDED FOR ERECTILE DYSFUNCTION     Urology: Erectile Dysfunction Agents Passed - 04/18/2020  1:13 PM      Passed - Last BP in normal range    BP Readings from Last 1 Encounters:  06/09/19 126/79         Passed - Valid encounter within last 12 months    Recent Outpatient Visits          10 months ago Allergic conjunctivitis of both eyes   [facility] [place], [place], [place]   1 year ago Rash and nonspecific skin eruption   [facility] [place], [place], [place]   1 year ago Parotitis   [place], [place], [place]   1 year ago Cellulitis of right lower extremity   [place], [place]   1 year ago Acute cystitis without hematuria   [facility] [place], [place], [place]      Future Appointments            In 5 days Brendolyn Patty, MD [place]

## 2020-04-23 ENCOUNTER — Other Ambulatory Visit: Payer: Self-pay

## 2020-04-23 ENCOUNTER — Ambulatory Visit: Payer: PPO | Admitting: Dermatology

## 2020-04-23 DIAGNOSIS — L2081 Atopic neurodermatitis: Secondary | ICD-10-CM | POA: Diagnosis not present

## 2020-04-23 DIAGNOSIS — L739 Follicular disorder, unspecified: Secondary | ICD-10-CM | POA: Diagnosis not present

## 2020-04-23 DIAGNOSIS — L570 Actinic keratosis: Secondary | ICD-10-CM

## 2020-04-23 MED ORDER — DUPILUMAB 300 MG/2ML ~~LOC~~ SOSY
300.0000 mg | PREFILLED_SYRINGE | Freq: Once | SUBCUTANEOUS | Status: AC
  Administered 2020-04-23: 300 mg via SUBCUTANEOUS

## 2020-04-23 NOTE — Progress Notes (Signed)
Follow-Up Visit   Subjective  Edward Rodriguez is a 80 y.o. male who presents for the following: Follow-up (Patient here for follow-up atopic neurodermatitis. He is on Dupixent injections (started in November 2021) and using TMC cream topically. He is much improved. He also has scalp folliculitis, improved with clindamycin solution and ciclopirox shampoo.). He is currently getting samples while he applies for Medicare low income subsidy.  The following portions of the chart were reviewed this encounter and updated as appropriate:       Review of Systems:  No other skin or systemic complaints except as noted in HPI or Assessment and Plan.  Objective  Well appearing patient in no apparent distress; mood and affect are within normal limits.  A focused examination was performed including face, scalp, flank. Relevant physical exam findings are noted in the Assessment and Plan.  Objective  flank, abd: Pink patch on left flank with dermatographism.  Objective  Scalp: Clear today.  Objective  R zygoma x 1, R ear helix x 2, L nose x 1, L lat brow x 1, L temple x 1, L infraocular x 1 (7): Pink scaly macules.   Assessment & Plan  Atopic neurodermatitis flank, abd  Improved.  Decreased itching  Continue Dupixent injections 300mg /mL sq q 2 weeks. Sample used today. Lot no [id number] Exp 02/2021. Info given to patient to call for low income subsidy. Dupixent 300mg /mL injected into the right lower abdomen. Patient tolerated well.  Continue TMC 0.1% Cream qd/bid prn. Avoid face, groin, axilla. Rec daily non-sedating antihistamine.  Pt takes Equate Claritin for allergies prn.  Restart Claritin qd.   Dupilumab (Dupixent) is a treatment given by injection for adults with moderate-to-severe atopic dermatitis. Goal is control of skin condition, not cure. It is given as 2 injections at the first dose followed by 1 injection ever 2 weeks thereafter.  Potential side effects include allergic reaction,  herpes infections, injection site reactions and conjunctivitis (inflammation of the eyes).  The use of Dupixent requires long term medication management, including periodic office visits.  Topical steroids (such as triamcinolone, fluocinolone, fluocinonide, mometasone, clobetasol, halobetasol, betamethasone, hydrocortisone) can cause thinning and lightening of the skin if they are used for too long in the same area. Your physician has selected the right strength medicine for your problem and area affected on the body. Please use your medication only as directed by your physician to prevent side effects.    Other Related Medications dupilumab (DUPIXENT) 300 MG/2ML prefilled syringe  Folliculitis Scalp  Improved.  Continue ciclopirox shampoo qd.  Continue clindamycin solution qd/bid prn.  Other Related Medications Ciclopirox 1 % shampoo  AK (actinic keratosis) (7) R zygoma x 1, R ear helix x 2, L nose x 1, L lat brow x 1, L temple x 1, L infraocular x 1  Destruction of lesion - R zygoma x 1, R ear helix x 2, L nose x 1, L lat brow x 1, L temple x 1, L infraocular x 1  Destruction method: cryotherapy   Informed consent: discussed and consent obtained   Lesion destroyed using liquid nitrogen: Yes   Region frozen until ice ball extended beyond lesion: Yes   Outcome: patient tolerated procedure well with no complications   Post-procedure details: wound care instructions given    Return in about 6 months (around 10/24/2020) for Dermatitis. Also every 2 weeks for Dupixent injection.Lindi Adie, CMA, am acting as scribe for Brendolyn Patty, MD .  Documentation: I  have reviewed the above documentation for accuracy and completeness, and I agree with the above.  Brendolyn Patty MD

## 2020-04-23 NOTE — Patient Instructions (Addendum)
If you need information about Medicare Prescription Drug plans or how to enroll in a plan, call 1-800-MEDICARE (TTY 1-(551)441-1883) or visit [email or web address]  There is the information needed to start to apply for the low income subsidy.    Cryotherapy Aftercare  . Wash gently with soap and water everyday.   Marland Kitchen Apply Vaseline and Band-Aid daily until healed.   Dupilumab (Dupixent) is a treatment given by injection for adults with moderate-to-severe atopic dermatitis. Goal is control of skin condition, not cure. It is given as 2 injections at the first dose followed by 1 injection ever 2 weeks thereafter.  Potential side effects include allergic reaction, herpes infections, injection site reactions and conjunctivitis (inflammation of the eyes).  The use of Dupixent requires long term medication management, including periodic office visits.

## 2020-04-28 ENCOUNTER — Other Ambulatory Visit: Payer: Self-pay | Admitting: Physician Assistant

## 2020-04-28 NOTE — Telephone Encounter (Signed)
Requested medication (s) are due for refill today: yes  Requested medication (s) are on the active medication list: yes  Last refill:  01/02/20  Future visit scheduled: no  Notes to clinic:  med not assigned to a protocol   Requested Prescriptions  Pending Prescriptions Disp Refills   SSD 1 % cream [Pharmacy Med Name: SSD 1 % External Cream] 50 g 0    Sig: APPLY TOPICALLY DAILY      Off-Protocol Failed - 04/28/2020 12:05 PM      Failed - Medication not assigned to a protocol, review manually.      Passed - Valid encounter within last 12 months    Recent Outpatient Visits           10 months ago Allergic conjunctivitis of both eyes   [place], [place]   1 year ago Rash and nonspecific skin eruption   [facility] [place], [place], [place]   1 year ago Parotitis   [place], [place], [place]   1 year ago Cellulitis of right lower extremity   [place], [place]   1 year ago Acute cystitis without hematuria   [facility], [place], [place]       Future Appointments             In 6 months Brendolyn Patty, MD [place]

## 2020-05-07 ENCOUNTER — Ambulatory Visit: Payer: PPO

## 2020-05-10 ENCOUNTER — Other Ambulatory Visit: Payer: Self-pay | Admitting: Physician Assistant

## 2020-05-10 DIAGNOSIS — N529 Male erectile dysfunction, unspecified: Secondary | ICD-10-CM

## 2020-05-16 ENCOUNTER — Ambulatory Visit: Payer: PPO | Admitting: Family Medicine

## 2020-05-21 ENCOUNTER — Ambulatory Visit: Payer: PPO

## 2020-05-22 DIAGNOSIS — M25552 Pain in left hip: Secondary | ICD-10-CM | POA: Diagnosis not present

## 2020-06-04 ENCOUNTER — Ambulatory Visit: Payer: PPO

## 2020-06-18 ENCOUNTER — Ambulatory Visit: Payer: PPO

## 2020-06-22 ENCOUNTER — Other Ambulatory Visit: Payer: Self-pay | Admitting: Physician Assistant

## 2020-06-22 DIAGNOSIS — N529 Male erectile dysfunction, unspecified: Secondary | ICD-10-CM

## 2020-06-24 NOTE — Telephone Encounter (Signed)
   Last refill:  05/21/2020  Future visit scheduled:no  Notes to clinic:  review for refill Patient has not seen new provider yet   Requested Prescriptions  Pending Prescriptions Disp Refills   sildenafil (REVATIO) 20 MG tablet [Pharmacy Med Name: Sildenafil Citrate 20 MG Oral Tablet] 50 tablet 0    Sig: TAKE 2 TO 5 TABLETS BY MOUTH ONCE DAILY AS NEEDED FOR ERECTILE DYSFUNCTION      Urology: Erectile Dysfunction Agents Failed - 06/22/2020 10:00 AM      Failed - Valid encounter within last 12 months    Recent Outpatient Visits           1 year ago Allergic conjunctivitis of both eyes   [facility] [place], [place], [place]   1 year ago Rash and nonspecific skin eruption   [facility] [place], [place], [place]   1 year ago Parotitis   [place], [place], [place]   2 years ago Cellulitis of right lower extremity   [place], [place], [place]   2 years ago Acute cystitis without hematuria   [place], [place]       Future Appointments             In 4 months Brendolyn Patty, MD [place] BP in normal range    BP Readings from Last 1 Encounters:  06/09/19 126/79

## 2020-07-02 ENCOUNTER — Ambulatory Visit: Payer: PPO

## 2020-07-14 ENCOUNTER — Other Ambulatory Visit: Payer: Self-pay | Admitting: Physician Assistant

## 2020-07-14 DIAGNOSIS — F419 Anxiety disorder, unspecified: Secondary | ICD-10-CM

## 2020-07-19 DIAGNOSIS — M25552 Pain in left hip: Secondary | ICD-10-CM | POA: Diagnosis not present

## 2020-07-25 DIAGNOSIS — M180 Bilateral primary osteoarthritis of first carpometacarpal joints: Secondary | ICD-10-CM | POA: Diagnosis not present

## 2020-07-29 ENCOUNTER — Other Ambulatory Visit: Payer: Self-pay | Admitting: Family Medicine

## 2020-07-29 DIAGNOSIS — N529 Male erectile dysfunction, unspecified: Secondary | ICD-10-CM

## 2020-07-29 NOTE — Telephone Encounter (Signed)
Requested medication (s) are due for refill today:yes    Requested medication (s) are on the active medication list: yes  Last refill: 06/24/2020  Future visit scheduled: no  Notes to clinic:overdue for follow up    Requested Prescriptions  Pending Prescriptions Disp Refills   sildenafil (REVATIO) 20 MG tablet [Pharmacy Med Name: Sildenafil Citrate 20 MG Oral Tablet] 50 tablet 0    Sig: TAKE 2 TO 5 TABLETS BY MOUTH ONCE DAILY AS NEEDED FOR ERECTILE DYSFUNCTION      Urology: Erectile Dysfunction Agents Failed - 07/29/2020  1:35 PM      Failed - Valid encounter within last 12 months    Recent Outpatient Visits           1 year ago Allergic conjunctivitis of both eyes   [facility] [place], [place], [place]   1 year ago Rash and nonspecific skin eruption   [facility] [place], [place], [place]   1 year ago Parotitis   [place], [place], [place]   2 years ago Cellulitis of right lower extremity   [place], [place], [place]   2 years ago Acute cystitis without hematuria   [place], [place], [place]       Future Appointments             In 3 months Brendolyn Patty, MD [place] BP in normal range    BP Readings from Last 1 Encounters:  06/09/19 126/79

## 2020-07-29 NOTE — Telephone Encounter (Signed)
[facility] is also requesting the following medication(s):  venlafaxine XR (EFFEXOR-XR) 150 MG 24 hr capsule  Last Rx: 06/09/19 Qty: 90 Refills: 3 LOV: 06/09/19

## 2020-07-30 DIAGNOSIS — M1612 Unilateral primary osteoarthritis, left hip: Secondary | ICD-10-CM | POA: Diagnosis not present

## 2020-08-01 ENCOUNTER — Other Ambulatory Visit: Payer: Self-pay | Admitting: Physician Assistant

## 2020-08-01 DIAGNOSIS — F419 Anxiety disorder, unspecified: Secondary | ICD-10-CM

## 2020-08-01 NOTE — Telephone Encounter (Signed)
   Notes to clinic: Vm left for patient to contact office Review for short supply until patient calls back    Requested Prescriptions  Pending Prescriptions Disp Refills   venlafaxine XR (EFFEXOR-XR) 150 MG 24 hr capsule 90 capsule 3    Sig: Take 1 capsule (150 mg total) by mouth daily with breakfast.      There is no refill protocol information for this order

## 2020-08-01 NOTE — Telephone Encounter (Signed)
Copied from [place] [id number]. Topic: Quick Communication - Rx Refill/Question >> Aug 01, 2020  1:58 PM Lenon Curt, Everette A wrote: Medication: venlafaxine XR (EFFEXOR-XR) 150 MG 24 hr capsule   Has the patient contacted their pharmacy? Yes.   (Agent: If no, request that the patient contact the pharmacy for the refill.) (Agent: If yes, when and what did the pharmacy advise?)  Preferred Pharmacy (with phone number or street name): [place], [place]  Phone:  [phone] Fax:  [phone]  Agent: Please be advised that RX refills may take up to 3 business days. We ask that you follow-up with your pharmacy.

## 2020-08-02 ENCOUNTER — Other Ambulatory Visit: Payer: Self-pay | Admitting: Physician Assistant

## 2020-08-02 DIAGNOSIS — F419 Anxiety disorder, unspecified: Secondary | ICD-10-CM

## 2020-08-05 ENCOUNTER — Other Ambulatory Visit: Payer: Self-pay | Admitting: Physician Assistant

## 2020-08-05 DIAGNOSIS — F419 Anxiety disorder, unspecified: Secondary | ICD-10-CM

## 2020-08-05 NOTE — Telephone Encounter (Signed)
Has not been seen in >1 yr. Needs f/u appt with some provider. Can give 30 day courtesy refill if needed.

## 2020-08-06 ENCOUNTER — Other Ambulatory Visit: Payer: Self-pay | Admitting: Family Medicine

## 2020-08-06 DIAGNOSIS — M1612 Unilateral primary osteoarthritis, left hip: Secondary | ICD-10-CM | POA: Insufficient documentation

## 2020-08-06 DIAGNOSIS — N529 Male erectile dysfunction, unspecified: Secondary | ICD-10-CM

## 2020-08-06 MED ORDER — VENLAFAXINE HCL ER 150 MG PO CP24: 150.0000 mg | ORAL_CAPSULE | Freq: Every day | ORAL | 0 refills | Status: DC

## 2020-08-06 NOTE — Telephone Encounter (Signed)
  Notes to clinic:  Review for refill Several attempts have been made to schedule appt Overdue for office visit     Requested Prescriptions  Pending Prescriptions Disp Refills   sildenafil (REVATIO) 20 MG tablet [Pharmacy Med Name: Sildenafil Citrate 20 MG Oral Tablet] 50 tablet 0    Sig: TAKE 2 TO 5 TABLETS BY MOUTH ONCE DAILY AS NEEDED FOR ERECTILE DYSFUNCTION      Urology: Erectile Dysfunction Agents Failed - 08/06/2020  9:18 AM      Failed - Valid encounter within last 12 months    Recent Outpatient Visits           1 year ago Allergic conjunctivitis of both eyes   [facility] [place], [place], [place]   1 year ago Rash and nonspecific skin eruption   [facility] [place], [place], [place]   1 year ago Parotitis   [place], [place], [place]   2 years ago Cellulitis of right lower extremity   [place], [place]   2 years ago Acute cystitis without hematuria   [place], [place]       Future Appointments             In 2 months Brendolyn Patty, MD [place] BP in normal range    BP Readings from Last 1 Encounters:  06/09/19 126/79

## 2020-08-19 DIAGNOSIS — Z01812 Encounter for preprocedural laboratory examination: Secondary | ICD-10-CM | POA: Diagnosis not present

## 2020-08-19 DIAGNOSIS — M1612 Unilateral primary osteoarthritis, left hip: Secondary | ICD-10-CM | POA: Diagnosis not present

## 2020-08-20 ENCOUNTER — Telehealth: Payer: Self-pay

## 2020-08-20 NOTE — Telephone Encounter (Signed)
FYI. PA on Sildenafil was denied.

## 2020-08-20 NOTE — Telephone Encounter (Signed)
Outcome Deniedon July 11 PA Case: [id number], Status: Denied. Notification: Completed. Drug Sildenafil Citrate 20MG  tablets Form [employer] 4-Part Electronic PA Form [id number] NCPDP)

## 2020-08-21 DIAGNOSIS — Z882 Allergy status to sulfonamides status: Secondary | ICD-10-CM | POA: Diagnosis not present

## 2020-08-21 DIAGNOSIS — F419 Anxiety disorder, unspecified: Secondary | ICD-10-CM | POA: Diagnosis not present

## 2020-08-21 DIAGNOSIS — H9190 Unspecified hearing loss, unspecified ear: Secondary | ICD-10-CM | POA: Diagnosis not present

## 2020-08-21 DIAGNOSIS — K219 Gastro-esophageal reflux disease without esophagitis: Secondary | ICD-10-CM | POA: Diagnosis not present

## 2020-08-21 DIAGNOSIS — M1612 Unilateral primary osteoarthritis, left hip: Secondary | ICD-10-CM | POA: Diagnosis not present

## 2020-08-21 DIAGNOSIS — G4700 Insomnia, unspecified: Secondary | ICD-10-CM | POA: Diagnosis not present

## 2020-08-21 DIAGNOSIS — M25752 Osteophyte, left hip: Secondary | ICD-10-CM | POA: Diagnosis not present

## 2020-08-22 DIAGNOSIS — M1612 Unilateral primary osteoarthritis, left hip: Secondary | ICD-10-CM | POA: Diagnosis not present

## 2020-08-26 DIAGNOSIS — M25552 Pain in left hip: Secondary | ICD-10-CM | POA: Diagnosis not present

## 2020-08-26 DIAGNOSIS — Z96642 Presence of left artificial hip joint: Secondary | ICD-10-CM | POA: Insufficient documentation

## 2020-08-26 DIAGNOSIS — R262 Difficulty in walking, not elsewhere classified: Secondary | ICD-10-CM | POA: Diagnosis not present

## 2020-08-28 DIAGNOSIS — R262 Difficulty in walking, not elsewhere classified: Secondary | ICD-10-CM | POA: Diagnosis not present

## 2020-08-28 DIAGNOSIS — M25552 Pain in left hip: Secondary | ICD-10-CM | POA: Diagnosis not present

## 2020-09-01 ENCOUNTER — Other Ambulatory Visit: Payer: Self-pay | Admitting: Family Medicine

## 2020-09-01 DIAGNOSIS — F419 Anxiety disorder, unspecified: Secondary | ICD-10-CM

## 2020-09-01 NOTE — Telephone Encounter (Addendum)
Pt needs to establish care with new PCP. Previous PCP no longer with practice.  Pt is due for a follow up visit. Sending to office for review and advice. Also sending CRM.   Last RF 08/06/20 #30- needs appt for refills.   Routing to [facility].

## 2020-09-03 DIAGNOSIS — M25552 Pain in left hip: Secondary | ICD-10-CM | POA: Diagnosis not present

## 2020-09-03 DIAGNOSIS — R262 Difficulty in walking, not elsewhere classified: Secondary | ICD-10-CM | POA: Diagnosis not present

## 2020-09-04 NOTE — Progress Notes (Deleted)
Established patient visit   Patient: Edward Rodriguez   DOB: 12-04-1940   80 y.o. Male  MRN: [id number] Visit Date: 09/05/2020  Today's healthcare provider: Lavon Paganini, MD   No chief complaint on file.  Subjective    HPI  Depression, Follow-up  He  was last seen for this 15 months ago. Changes made at last visit include none.   He reports {excellent/good/fair/poor:19665} compliance with treatment. He {is/is not:21021397} having side effects. ***  He reports {DESC; GOOD/FAIR/POOR:18685} tolerance of treatment. Current symptoms include: {Symptoms; depression:1002} He feels he is {improved/worse/unchanged:3041574} since last visit.  Depression screen [facility] 2/9 09/19/2019 02/11/2018 06/22/2017  Decreased Interest 0 0 0  Down, Depressed, Hopeless 0 0 0  PHQ - 2 Score 0 0 0  Altered sleeping - - -  Tired, decreased energy - - -  Change in appetite - - -  Feeling bad or failure about yourself  - - -  Trouble concentrating - - -  Moving slowly or fidgety/restless - - -  Suicidal thoughts - - -  PHQ-9 Score - - -  Difficult doing work/chores - - -    -----------------------------------------------------------------------------------------   {Show patient history (optional):23778}   Medications: Outpatient Medications Prior to Visit  Medication Sig   azelastine (OPTIVAR) 0.05 % ophthalmic solution Place 1 drop into both eyes 2 (two) times daily. (Patient not taking: Reported on 09/19/2019)   Ciclopirox 1 % shampoo Massage into scalp, let sit a few minutes before rinsing. Use daily as needed.   clindamycin (CLEOCIN T) 1 % external solution APPLY TO THE AFFECTED AREAS OF SCALP AT BEDTIME AS NEEDED   dupilumab (DUPIXENT) 300 MG/2ML prefilled syringe Inject 300 mg into the skin every 14 (fourteen) days. Starting at day 15 for maintenance.   ketoconazole (NIZORAL) 2 % shampoo SHAMPOO WITH A SMALL AMOUNT AS DIRECTED UP TO THREE TIMES PER WEEK   loratadine (CLARITIN) 10 MG tablet  Take 10 mg by mouth daily.   meloxicam (MOBIC) 15 MG tablet Take 15 mg by mouth daily.    omeprazole (PRILOSEC) 20 MG capsule Take 40 mg by mouth every other day.   ondansetron (ZOFRAN) 4 MG tablet Take 1 tablet (4 mg total) by mouth every 8 (eight) hours as needed. (Patient not taking: Reported on 09/19/2019)   pramoxine-hydrocortisone (PROCTOCREAM-HC) 1-1 % rectal cream Place rectally 3 (three) times daily. As needed for hemorrhoids   sildenafil (REVATIO) 20 MG tablet TAKE 2 TO 5 TABLETS BY MOUTH ONCE DAILY AS NEEDED FOR ERECTILE DYSFUNCTION   SSD 1 % cream APPLY TOPICALLY DAILY   triamcinolone (KENALOG) 0.1 % APPLY  CREAM EXTERNALLY TO AFFECTED AREA TWICE DAILY RASH  OR  ITCHING AS NEEDED.  AVOID  FACE,  GROIN  AND  AXILLA.   venlafaxine XR (EFFEXOR-XR) 150 MG 24 hr capsule Take 1 capsule (150 mg total) by mouth daily with breakfast.   Facility-Administered Medications Prior to Visit  Medication Dose Route Frequency Provider   betamethasone acetate-betamethasone sodium phosphate (CELESTONE) injection 3 mg  3 mg Intramuscular Once Daylene Katayama M, DPM   betamethasone acetate-betamethasone sodium phosphate (CELESTONE) injection 3 mg  3 mg Intramuscular Once Daylene Katayama M, DPM   betamethasone acetate-betamethasone sodium phosphate (CELESTONE) injection 3 mg  3 mg Intramuscular Once Daylene Katayama M, DPM   betamethasone acetate-betamethasone sodium phosphate (CELESTONE) injection 3 mg  3 mg Intramuscular Once Daylene Katayama M, DPM   betamethasone acetate-betamethasone sodium phosphate (CELESTONE) injection 3 mg  3 mg Intramuscular  Once Daylene Katayama M, DPM   betamethasone acetate-betamethasone sodium phosphate (CELESTONE) injection 3 mg  3 mg Intramuscular Once Edrick Kins, DPM    Review of Systems  {Labs  Heme  Chem  Endocrine  Serology  Results Review (optional):23779}   Objective    There were no vitals taken for this visit. {Show previous vital signs (optional):23777}   Physical Exam   ***  No results found for any visits on 09/05/20.  Assessment & Plan     ***  No follow-ups on file.      {provider attestation***:1}   Lavon Paganini, MD  [facility] [phone] (phone) [phone] (fax)  [place]

## 2020-09-05 ENCOUNTER — Other Ambulatory Visit: Payer: Self-pay

## 2020-09-05 ENCOUNTER — Ambulatory Visit (INDEPENDENT_AMBULATORY_CARE_PROVIDER_SITE_OTHER): Payer: PPO | Admitting: Family Medicine

## 2020-09-05 DIAGNOSIS — F419 Anxiety disorder, unspecified: Secondary | ICD-10-CM

## 2020-09-05 DIAGNOSIS — M1612 Unilateral primary osteoarthritis, left hip: Secondary | ICD-10-CM

## 2020-09-05 DIAGNOSIS — F411 Generalized anxiety disorder: Secondary | ICD-10-CM

## 2020-09-05 DIAGNOSIS — R262 Difficulty in walking, not elsewhere classified: Secondary | ICD-10-CM | POA: Diagnosis not present

## 2020-09-05 DIAGNOSIS — M25552 Pain in left hip: Secondary | ICD-10-CM | POA: Diagnosis not present

## 2020-09-05 MED ORDER — VENLAFAXINE HCL ER 150 MG PO CP24: 150.0000 mg | ORAL_CAPSULE | Freq: Every day | ORAL | 1 refills | Status: DC

## 2020-09-05 NOTE — Assessment & Plan Note (Signed)
S/p total L hip replacement; denies post-op complications or concerns; will follow-up w/ ortho

## 2020-09-05 NOTE — Progress Notes (Signed)
Established patient visit   Patient: Edward Rodriguez   DOB: 1941-02-09   80 y.o. Male  MRN: [id number] Visit Date: 09/05/2020  Today's healthcare provider: Lavon Paganini, MD   Chief Complaint  Patient presents with   Anxiety    Subjective    HPI  Generalized Anxiety Disorder - Pt. presents for RF of Venlafaxine; he states that he was recently given a two-week supply which will run out in 3 days - He denies side effects of Venlafaxine; BP stable in office today - Denies recent bouts of anxiety  Osteoarthritis of L Hip - s/p total L hip replacement on 09/03/20; pt. denies post-op pain/complications aside from mild localized swelling over L hip     Medications: Outpatient Medications Prior to Visit  Medication Sig   dupilumab (DUPIXENT) 300 MG/2ML prefilled syringe Inject 300 mg into the skin every 14 (fourteen) days. Starting at day 15 for maintenance.   meloxicam (MOBIC) 15 MG tablet Take 15 mg by mouth daily.    omeprazole (PRILOSEC) 20 MG capsule Take 40 mg by mouth every other day.   pramoxine-hydrocortisone (PROCTOCREAM-HC) 1-1 % rectal cream Place rectally 3 (three) times daily. As needed for hemorrhoids   sildenafil (REVATIO) 20 MG tablet TAKE 2 TO 5 TABLETS BY MOUTH ONCE DAILY AS NEEDED FOR ERECTILE DYSFUNCTION   SSD 1 % cream APPLY TOPICALLY DAILY   triamcinolone (KENALOG) 0.1 % APPLY  CREAM EXTERNALLY TO AFFECTED AREA TWICE DAILY RASH  OR  ITCHING AS NEEDED.  AVOID  FACE,  GROIN  AND  AXILLA.   [DISCONTINUED] venlafaxine XR (EFFEXOR-XR) 150 MG 24 hr capsule Take 1 capsule (150 mg total) by mouth daily with breakfast.   [DISCONTINUED] azelastine (OPTIVAR) 0.05 % ophthalmic solution Place 1 drop into both eyes 2 (two) times daily.   [DISCONTINUED] Ciclopirox 1 % shampoo Massage into scalp, let sit a few minutes before rinsing. Use daily as needed.   [DISCONTINUED] clindamycin (CLEOCIN T) 1 % external solution APPLY TO THE AFFECTED AREAS OF SCALP AT BEDTIME AS NEEDED    [DISCONTINUED] ketoconazole (NIZORAL) 2 % shampoo SHAMPOO WITH A SMALL AMOUNT AS DIRECTED UP TO THREE TIMES PER WEEK   [DISCONTINUED] loratadine (CLARITIN) 10 MG tablet Take 10 mg by mouth daily.   [DISCONTINUED] ondansetron (ZOFRAN) 4 MG tablet Take 1 tablet (4 mg total) by mouth every 8 (eight) hours as needed.   Facility-Administered Medications Prior to Visit  Medication Dose Route Frequency Provider   betamethasone acetate-betamethasone sodium phosphate (CELESTONE) injection 3 mg  3 mg Intramuscular Once Daylene Katayama M, DPM   betamethasone acetate-betamethasone sodium phosphate (CELESTONE) injection 3 mg  3 mg Intramuscular Once Daylene Katayama M, DPM   betamethasone acetate-betamethasone sodium phosphate (CELESTONE) injection 3 mg  3 mg Intramuscular Once Daylene Katayama M, DPM   betamethasone acetate-betamethasone sodium phosphate (CELESTONE) injection 3 mg  3 mg Intramuscular Once Daylene Katayama M, DPM   betamethasone acetate-betamethasone sodium phosphate (CELESTONE) injection 3 mg  3 mg Intramuscular Once Daylene Katayama M, DPM   betamethasone acetate-betamethasone sodium phosphate (CELESTONE) injection 3 mg  3 mg Intramuscular Once Edrick Kins, DPM    Review of Systems  Constitutional:  Positive for activity change. Negative for appetite change, fatigue and fever.  HENT: Negative.    Eyes: Negative.   Respiratory: Negative.  Negative for chest tightness and shortness of breath.   Cardiovascular: Negative.  Negative for chest pain and leg swelling.  Gastrointestinal: Negative.   Endocrine: Negative.  Genitourinary: Negative.   Musculoskeletal:  Positive for joint swelling.  Skin: Negative.   Neurological: Negative.   Psychiatric/Behavioral: Negative.        Objective    BP 128/70 (BP Location: Left Arm, Patient Position: Sitting, Cuff Size: Normal)   Pulse 98   Temp 98.2 F (36.8 C) (Oral)   Wt 195 lb (88.5 kg)   SpO2 98%   BMI 27.20 kg/m     Physical  Exam Constitutional:      Appearance: Normal appearance. He is normal weight.  HENT:     Head: Normocephalic and atraumatic.     Right Ear: External ear normal.     Left Ear: External ear normal.  Eyes:     Conjunctiva/sclera: Conjunctivae normal.  Cardiovascular:     Rate and Rhythm: Normal rate and regular rhythm.     Pulses: Normal pulses.     Heart sounds: Normal heart sounds.  Pulmonary:     Effort: Pulmonary effort is normal.     Breath sounds: Normal breath sounds.  Abdominal:     General: Abdomen is flat. Bowel sounds are normal.     Palpations: Abdomen is soft.  Skin:    General: Skin is warm and dry.  Neurological:     Mental Status: He is alert.  Psychiatric:        Behavior: Behavior normal.        Thought Content: Thought content normal.      No results found for any visits on 09/05/20.  Assessment & Plan     Problem List Items Addressed This Visit       Musculoskeletal and Integument   Osteoarthritis    S/p total L hip replacement; denies post-op complications or concerns; will follow-up w/ ortho         Other   Anxiety    Well-controlled; RF Venlafaxine       Relevant Medications   venlafaxine XR (EFFEXOR-XR) 150 MG 24 hr capsule     Return in about 6 months (around 03/08/2021) for AWV, CPE, With new PCP.       Percell Locus, MS3    Patient seen along with MS3 student Percell Locus. I personally evaluated this patient along with the student, and verified all aspects of the history, physical exam, and medical decision making as documented by the student. I agree with the student's documentation and have made all necessary edits.  Dennis Hegeman, Dionne Bucy, MD, MPH [facility]

## 2020-09-05 NOTE — Assessment & Plan Note (Signed)
Well-controlled; RF Venlafaxine

## 2020-09-06 ENCOUNTER — Encounter: Payer: Self-pay | Admitting: Family Medicine

## 2020-09-07 DIAGNOSIS — Z96649 Presence of unspecified artificial hip joint: Secondary | ICD-10-CM | POA: Insufficient documentation

## 2020-09-10 DIAGNOSIS — M25552 Pain in left hip: Secondary | ICD-10-CM | POA: Diagnosis not present

## 2020-09-10 DIAGNOSIS — R262 Difficulty in walking, not elsewhere classified: Secondary | ICD-10-CM | POA: Diagnosis not present

## 2020-09-11 ENCOUNTER — Other Ambulatory Visit: Payer: Self-pay | Admitting: Dermatology

## 2020-09-12 DIAGNOSIS — M25552 Pain in left hip: Secondary | ICD-10-CM | POA: Diagnosis not present

## 2020-09-12 DIAGNOSIS — R262 Difficulty in walking, not elsewhere classified: Secondary | ICD-10-CM | POA: Diagnosis not present

## 2020-09-17 ENCOUNTER — Other Ambulatory Visit: Payer: Self-pay | Admitting: Family Medicine

## 2020-09-17 DIAGNOSIS — N529 Male erectile dysfunction, unspecified: Secondary | ICD-10-CM

## 2020-09-17 DIAGNOSIS — R262 Difficulty in walking, not elsewhere classified: Secondary | ICD-10-CM | POA: Diagnosis not present

## 2020-09-17 DIAGNOSIS — M25552 Pain in left hip: Secondary | ICD-10-CM | POA: Diagnosis not present

## 2020-09-19 DIAGNOSIS — R262 Difficulty in walking, not elsewhere classified: Secondary | ICD-10-CM | POA: Diagnosis not present

## 2020-09-19 DIAGNOSIS — M25552 Pain in left hip: Secondary | ICD-10-CM | POA: Diagnosis not present

## 2020-09-24 DIAGNOSIS — M25552 Pain in left hip: Secondary | ICD-10-CM | POA: Diagnosis not present

## 2020-09-24 DIAGNOSIS — R262 Difficulty in walking, not elsewhere classified: Secondary | ICD-10-CM | POA: Diagnosis not present

## 2020-10-01 ENCOUNTER — Ambulatory Visit (INDEPENDENT_AMBULATORY_CARE_PROVIDER_SITE_OTHER): Payer: PPO | Admitting: Family Medicine

## 2020-10-01 ENCOUNTER — Other Ambulatory Visit: Payer: Self-pay

## 2020-10-01 ENCOUNTER — Encounter: Payer: Self-pay | Admitting: Family Medicine

## 2020-10-01 VITALS — BP 126/81 | HR 96 | Temp 98.3°F | Resp 15 | Wt 186.3 lb

## 2020-10-01 DIAGNOSIS — R683 Clubbing of fingers: Secondary | ICD-10-CM | POA: Insufficient documentation

## 2020-10-01 DIAGNOSIS — R0602 Shortness of breath: Secondary | ICD-10-CM | POA: Diagnosis not present

## 2020-10-01 DIAGNOSIS — T733XXA Exhaustion due to excessive exertion, initial encounter: Secondary | ICD-10-CM | POA: Diagnosis not present

## 2020-10-01 DIAGNOSIS — J301 Allergic rhinitis due to pollen: Secondary | ICD-10-CM

## 2020-10-01 DIAGNOSIS — Z96642 Presence of left artificial hip joint: Secondary | ICD-10-CM

## 2020-10-01 DIAGNOSIS — Z96649 Presence of unspecified artificial hip joint: Secondary | ICD-10-CM | POA: Insufficient documentation

## 2020-10-01 MED ORDER — ALBUTEROL SULFATE HFA 108 (90 BASE) MCG/ACT IN AERS: 2.0000 | INHALATION_SPRAY | Freq: Four times a day (QID) | RESPIRATORY_TRACT | 2 refills | Status: DC | PRN

## 2020-10-01 NOTE — Assessment & Plan Note (Signed)
Noticed when working out in yard [employer] properly hydrated Can work for 5 mins and then sits for 3 mins Denies CP or chest heaviness/tightness

## 2020-10-01 NOTE — Assessment & Plan Note (Signed)
Mild clubbing noted Sats mid 90s Ex smoker

## 2020-10-01 NOTE — Assessment & Plan Note (Signed)
Seasonal allergies Not using any Rx Primary complaint of sneezing Recommend zyrtec and flonase

## 2020-10-01 NOTE — Progress Notes (Signed)
Established patient visit   Patient: Edward Rodriguez   DOB: 08/04/1940   80 y.o. Male  MRN: [id number] Visit Date: 10/01/2020  Today's healthcare provider: Gwyneth Sprout, FNP   Chief Complaint  Patient presents with   Shortness of Breath   Subjective  -------------------------------------------------------------------------------------------------------------------- Shortness of Breath This is a recurrent problem. The current episode started more than 1 year ago. The problem has been gradually worsening. Pertinent negatives include no abdominal pain, chest pain, claudication, coryza, ear pain, fever, headaches, hemoptysis, leg pain, leg swelling, neck pain, orthopnea, PND, rash, rhinorrhea, sore throat, sputum production, swollen glands, syncope or vomiting. The symptoms are aggravated by exercise and any activity. Risk factors: former smoker. He has tried nothing for the symptoms.        Medications: Outpatient Medications Prior to Visit  Medication Sig   clindamycin (CLEOCIN T) 1 % external solution APPLY TO THE AFFECTED AREAS OF SCALP AT BEDTIME AS NEEDED   dupilumab (DUPIXENT) 300 MG/2ML prefilled syringe Inject 300 mg into the skin every 14 (fourteen) days. Starting at day 15 for maintenance.   meloxicam (MOBIC) 15 MG tablet Take 15 mg by mouth daily.    omeprazole (PRILOSEC) 20 MG capsule Take 40 mg by mouth every other day.   pramoxine-hydrocortisone (PROCTOCREAM-HC) 1-1 % rectal cream Place rectally 3 (three) times daily. As needed for hemorrhoids   sildenafil (REVATIO) 20 MG tablet TAKE 2 TO 5 TABLETS BY MOUTH ONCE DAILY AS NEEDED FOR ERECTILE DYSFUNCTION   SSD 1 % cream APPLY TOPICALLY DAILY   triamcinolone (KENALOG) 0.1 % APPLY  CREAM EXTERNALLY TO AFFECTED AREA TWICE DAILY RASH  OR  ITCHING AS NEEDED.  AVOID  FACE,  GROIN  AND  AXILLA.   venlafaxine XR (EFFEXOR-XR) 150 MG 24 hr capsule Take 1 capsule (150 mg total) by mouth daily with breakfast.   [DISCONTINUED]  betamethasone acetate-betamethasone sodium phosphate (CELESTONE) injection 3 mg    [DISCONTINUED] betamethasone acetate-betamethasone sodium phosphate (CELESTONE) injection 3 mg    [DISCONTINUED] betamethasone acetate-betamethasone sodium phosphate (CELESTONE) injection 3 mg    [DISCONTINUED] betamethasone acetate-betamethasone sodium phosphate (CELESTONE) injection 3 mg    [DISCONTINUED] betamethasone acetate-betamethasone sodium phosphate (CELESTONE) injection 3 mg    [DISCONTINUED] betamethasone acetate-betamethasone sodium phosphate (CELESTONE) injection 3 mg    No facility-administered medications prior to visit.    Review of Systems  Constitutional:  Negative for fever.  HENT:  Negative for ear pain, rhinorrhea and sore throat.   Respiratory:  Positive for shortness of breath. Negative for hemoptysis and sputum production.   Cardiovascular:  Negative for chest pain, orthopnea, claudication, leg swelling, syncope and PND.  Gastrointestinal:  Negative for abdominal pain and vomiting.  Musculoskeletal:  Negative for neck pain.  Skin:  Negative for rash.  Neurological:  Negative for headaches.      Objective  -------------------------------------------------------------------------------------------------------------------- BP 126/81   Pulse 96   Temp 98.3 F (36.8 C) (Oral)   Resp 15   Wt 186 lb 4.8 oz (84.5 kg)   SpO2 95%   BMI 25.98 kg/m     Physical Exam Vitals and nursing note reviewed.  Constitutional:      General: He is not in acute distress.    Appearance: He is well-developed and normal weight. He is not ill-appearing, toxic-appearing or diaphoretic.     Interventions: He is not intubated. HENT:     Head: Normocephalic.     Mouth/Throat:     Mouth: Mucous membranes are moist.  Eyes:  Extraocular Movements: Extraocular movements intact.  Cardiovascular:     Rate and Rhythm: Normal rate and regular rhythm.  Pulmonary:     Effort: Pulmonary effort is  normal. No tachypnea, bradypnea, accessory muscle usage or respiratory distress. He is not intubated.     Breath sounds: No stridor. Examination of the right-lower field reveals decreased breath sounds and rales. Examination of the left-lower field reveals decreased breath sounds and rales. Decreased breath sounds and rales present. No wheezing.  Chest:     Chest wall: No mass, deformity, tenderness, crepitus or edema. There is no dullness to percussion.  Musculoskeletal:        General: Normal range of motion.     Cervical back: Normal range of motion.     Right lower leg: No tenderness. No edema.     Left lower leg: No tenderness. No edema.  Skin:    General: Skin is warm and dry.     Capillary Refill: Capillary refill takes less than 2 seconds.     Coloration: Skin is not cyanotic or pale.     Findings: No ecchymosis, erythema or rash.     Nails: There is clubbing.  Neurological:     General: No focal deficit present.     Mental Status: He is alert and oriented to person, place, and time.  Psychiatric:        Mood and Affect: Mood normal.        Behavior: Behavior normal.    Very fine crackles in bases; mild clubbing of fingers.  No results found for any visits on 10/01/20.  Assessment & Plan  ---------------------------------------------------------------------------------------------------------------------- Problem List Items Addressed This Visit       Respiratory   Allergic rhinitis    Seasonal allergies Not using any Rx Primary complaint of sneezing Recommend zyrtec and flonase        Musculoskeletal and Integument   Clubbing of fingers    Mild clubbing noted Sats mid 90s Ex smoker        Other   SOB (shortness of breath) - Primary    Noticed when working out in yard [employer] properly hydrated Can work for 5 mins and then sits for 3 mins Denies CP or chest heaviness/tightness      Relevant Medications   albuterol (VENTOLIN HFA) 108 (90 Base) MCG/ACT  inhaler   Other Relevant Orders   Ambulatory referral to Pulmonology   Status post hip replacement    S/p L hip replacement PT completed Back to baseline- but not golfing      Fatigue due to excessive exertion    Noted within 5 mins of labor outside Albuterol inhaler provided with instructions prior to use        Return in about 4 weeks (around 10/29/2020), or if symptoms worsen or fail to improve.      Vonna Kotyk, FNP, have reviewed all documentation for this visit. The documentation on 10/01/20 for the exam, diagnosis, procedures, and orders are all accurate and complete.    Gwyneth Sprout, [place] [phone] (phone) [phone] (fax)  [place]

## 2020-10-01 NOTE — Assessment & Plan Note (Signed)
Noted within 5 mins of labor outside Albuterol inhaler provided with instructions prior to use

## 2020-10-01 NOTE — Assessment & Plan Note (Signed)
S/p L hip replacement PT completed Back to baseline- but not golfing

## 2020-10-20 ENCOUNTER — Other Ambulatory Visit: Payer: Self-pay | Admitting: Dermatology

## 2020-10-25 ENCOUNTER — Other Ambulatory Visit: Payer: Self-pay | Admitting: Family Medicine

## 2020-10-25 DIAGNOSIS — N529 Male erectile dysfunction, unspecified: Secondary | ICD-10-CM

## 2020-10-29 ENCOUNTER — Other Ambulatory Visit: Payer: Self-pay

## 2020-10-29 ENCOUNTER — Ambulatory Visit: Payer: PPO | Admitting: Dermatology

## 2020-10-29 DIAGNOSIS — L578 Other skin changes due to chronic exposure to nonionizing radiation: Secondary | ICD-10-CM | POA: Diagnosis not present

## 2020-10-29 DIAGNOSIS — L2081 Atopic neurodermatitis: Secondary | ICD-10-CM | POA: Diagnosis not present

## 2020-10-29 DIAGNOSIS — L820 Inflamed seborrheic keratosis: Secondary | ICD-10-CM

## 2020-10-29 DIAGNOSIS — Z1283 Encounter for screening for malignant neoplasm of skin: Secondary | ICD-10-CM

## 2020-10-29 DIAGNOSIS — L570 Actinic keratosis: Secondary | ICD-10-CM | POA: Diagnosis not present

## 2020-10-29 DIAGNOSIS — D692 Other nonthrombocytopenic purpura: Secondary | ICD-10-CM | POA: Diagnosis not present

## 2020-10-29 DIAGNOSIS — L739 Follicular disorder, unspecified: Secondary | ICD-10-CM | POA: Diagnosis not present

## 2020-10-29 MED ORDER — HYDROXYZINE HCL 10 MG PO TABS: ORAL_TABLET | ORAL | 2 refills | Status: DC

## 2020-10-29 MED ORDER — CLOBETASOL PROPIONATE 0.05 % EX SOLN: CUTANEOUS | 2 refills | Status: DC

## 2020-10-29 NOTE — Progress Notes (Signed)
Follow-Up Visit   Subjective  Edward Rodriguez is a 80 y.o. male who presents for the following: Follow-up (Patient presents for 6 month follow-up Atopic Neurodermatitis of the bilateral flank. He is not taking Dupixent injections and hasn't since his last visit. He used it around 3 months. He is using topical TMC 0.1% Cream as needed, but would like something stronger. He requests hydroxyzine tablets for itch, as he has used some of his wife's in the past. ) and Folliculitis (Scalp, using clindamycin solution daily and Neutrogena T-Sal.).  He thought Dupixent helped some, but not enough to keep coming into the office to get injections every two weeks.  Itching has worsened since off of it.  He is willing to try it again but wants to do shots at home.   The following portions of the chart were reviewed this encounter and updated as appropriate:       Review of Systems:  No other skin or systemic complaints except as noted in HPI or Assessment and Plan.  Objective  Well appearing patient in no apparent distress; mood and affect are within normal limits.  A focused examination was performed including face, trunk. Relevant physical exam findings are noted in the Assessment and Plan.  Bilateral Flank, Back Diffuse xerosis with mild lichenification on the mid and lower back.  Scalp Scattered crusted papules of the scalp.  scalp x 4, L mid back x 3 (one adjacent to white scar) (7) Firm scaly papules with excoriations   2nd webspace L 3rd finger x 1, L malar cheek x 2, R forehead x 2 (5) Erythematous thin papules/macules with gritty scale.   Right Shoulder area x 4 (4) Keratotic papules.   Assessment & Plan  Skin cancer screening performed today.  Actinic Damage - chronic, secondary to cumulative UV radiation exposure/sun exposure over time - diffuse scaly erythematous macules with underlying dyspigmentation - Recommend daily broad spectrum sunscreen SPF 30+ to sun-exposed areas, reapply  every 2 hours as needed.  - Recommend staying in the shade or wearing long sleeves, sun glasses (UVA+UVB protection) and wide brim hats (4-inch brim around the entire circumference of the hat). - Call for new or changing lesions.  Purpura - Chronic; persistent and recurrent.  Treatable, but not curable. - Violaceous macules and patches - Benign - Related to trauma, age, sun damage and/or use of blood thinners, chronic use of topical and/or oral steroids - Observe - Can use OTC arnica containing moisturizer such as Dermend Bruise Formula if desired - Call for worsening or other concerns   Atopic neurodermatitis Bilateral Flank, Back  with severe persistent pruritus and excoriations.  he has failed treatment with topical corticosteroids; Eucrisa topically and antihistamines.    Bx proven atopic dermatitis 02/15/2019  Plan to restart Dupixent injections. Dupixent My[employer] forms filled out in office today. Start hydroxyzine 10mg  take 1-2 po qhs prn itch dsp #60 2Rf.  Start Clobetasol solution mixed in CeraVe Cream Apply BID AAs prn itch dsp 44mL 2Rf.  Dupilumab (Dupixent) is a treatment given by injection for adults and children with moderate-to-severe atopic dermatitis. Goal is control of skin condition, not cure. It is given as 2 injections at the first dose followed by 1 injection ever 2 weeks thereafter.  Young children are dosed monthly.  Potential side effects include allergic reaction, herpes infections, injection site reactions and conjunctivitis (inflammation of the eyes).  The use of Dupixent requires long term medication management, including periodic office visits.  Topical steroids (such  as triamcinolone, fluocinolone, fluocinonide, mometasone, clobetasol, halobetasol, betamethasone, hydrocortisone) can cause thinning and lightening of the skin if they are used for too long in the same area. Your physician has selected the right strength medicine for your problem and area affected  on the body. Please use your medication only as directed by your physician to prevent side effects.     hydrOXYzine (ATARAX/VISTARIL) 10 MG tablet - Bilateral Flank, Back Take 1-2 tablets by mouth every evening as needed for itch.  clobetasol (TEMOVATE) 0.05 % external solution - Bilateral Flank, Back Patient to mix clobetasol solution in jar of CeraVe Cream. Apply to affected areas rash twice daily until improved. Avoid face, groin, axilla.  Related Medications dupilumab (DUPIXENT) 300 MG/2ML prefilled syringe Inject 300 mg into the skin every 14 (fourteen) days. Starting at day 15 for maintenance.  Folliculitis Scalp  Continue clindamycin solution BID prn. Start Head & Shoulders Clinical Strength shampoo, may alternate with T-Sal.    Inflamed seborrheic keratosis scalp x 4, L mid back x 3 (one adjacent to white scar)  Vs Prurigo Nodules  Avoid picking.  Destruction of lesion - scalp x 4, L mid back x 3 (one adjacent to white scar)  Destruction method: cryotherapy   Informed consent: discussed and consent obtained   Lesion destroyed using liquid nitrogen: Yes   Region frozen until ice ball extended beyond lesion: Yes   Outcome: patient tolerated procedure well with no complications   Post-procedure details: wound care instructions given   Additional details:  Prior to procedure, discussed risks of blister formation, small wound, skin dyspigmentation, or rare scar following cryotherapy. Recommend Vaseline ointment to treated areas while healing.   AK (actinic keratosis) (5) 2nd webspace L 3rd finger x 1, L malar cheek x 2, R forehead x 2  Actinic keratoses are precancerous spots that appear secondary to cumulative UV radiation exposure/sun exposure over time. They are chronic with expected duration over 1 year. A portion of actinic keratoses will progress to squamous cell carcinoma of the skin. It is not possible to reliably predict which spots will progress to skin cancer  and so treatment is recommended to prevent development of skin cancer.  Recommend daily broad spectrum sunscreen SPF 30+ to sun-exposed areas, reapply every 2 hours as needed.  Recommend staying in the shade or wearing long sleeves, sun glasses (UVA+UVB protection) and wide brim hats (4-inch brim around the entire circumference of the hat). Call for new or changing lesions.  Destruction of lesion - 2nd webspace L 3rd finger x 1, L malar cheek x 2, R forehead x 2  Destruction method: cryotherapy   Informed consent: discussed and consent obtained   Lesion destroyed using liquid nitrogen: Yes   Region frozen until ice ball extended beyond lesion: Yes   Outcome: patient tolerated procedure well with no complications   Post-procedure details: wound care instructions given   Additional details:  Prior to procedure, discussed risks of blister formation, small wound, skin dyspigmentation, or rare scar following cryotherapy. Recommend Vaseline ointment to treated areas while healing.   Hypertrophic actinic keratosis (4) Right Shoulder area x 4  Actinic keratoses are precancerous spots that appear secondary to cumulative UV radiation exposure/sun exposure over time. They are chronic with expected duration over 1 year. A portion of actinic keratoses will progress to squamous cell carcinoma of the skin. It is not possible to reliably predict which spots will progress to skin cancer and so treatment is recommended to prevent development of skin cancer.  Recommend daily broad spectrum sunscreen SPF 30+ to sun-exposed areas, reapply every 2 hours as needed.  Recommend staying in the shade or wearing long sleeves, sun glasses (UVA+UVB protection) and wide brim hats (4-inch brim around the entire circumference of the hat). Call for new or changing lesions.  Destruction of lesion - Right Shoulder area x 4  Destruction method: cryotherapy   Informed consent: discussed and consent obtained   Lesion  destroyed using liquid nitrogen: Yes   Region frozen until ice ball extended beyond lesion: Yes   Outcome: patient tolerated procedure well with no complications   Post-procedure details: wound care instructions given   Additional details:  Prior to procedure, discussed risks of blister formation, small wound, skin dyspigmentation, or rare scar following cryotherapy. Recommend Vaseline ointment to treated areas while healing.   Return in about 2 months (around 12/29/2020) for dermatitis, Dupixent, f/up AKs/ISks.  IJamesetta Orleans, CMA, am acting as scribe for Brendolyn Patty, MD .  Documentation: I have reviewed the above documentation for accuracy and completeness, and I agree with the above.  Brendolyn Patty MD

## 2020-10-29 NOTE — Patient Instructions (Addendum)
Eczema Skin Care  Buy TWO 16oz jars of CeraVe moisturizing cream  [facility], [facility], [facility] (no prescription needed)  Costs about $15 per jar   Jar #1: Use as a moisturizer as needed. Can be applied to any area of the body. Use twice daily to unaffected areas.  Jar #2: Pour one 79ml bottle of clobetasol 0.05% solution into jar, mix well. Label this jar to indicate the medication has been added. Use twice daily to affected areas. Do not apply to face, groin or underarms.  Moisturizer may burn or sting initially. Try for at least 4 weeks.    Topical steroids (such as triamcinolone, fluocinolone, fluocinonide, mometasone, clobetasol, halobetasol, betamethasone, hydrocortisone) can cause thinning and lightening of the skin if they are used for too long in the same area. Your physician has selected the right strength medicine for your problem and area affected on the body. Please use your medication only as directed by your physician to prevent side effects.    Dupilumab (Dupixent) is a treatment given by injection for adults and children with moderate-to-severe atopic dermatitis. Goal is control of skin condition, not cure. It is given as 2 injections at the first dose followed by 1 injection ever 2 weeks thereafter.  Young children are dosed monthly.  Potential side effects include allergic reaction, herpes infections, injection site reactions and conjunctivitis (inflammation of the eyes).  The use of Dupixent requires long term medication management, including periodic office visits.    If you have any questions or concerns for your doctor, please call our main line at [phone] and press option 4 to reach your doctor's medical assistant. If no one answers, please leave a voicemail as directed and we will return your call as soon as possible. Messages left after 4 pm will be answered the following business day.   You may also send [place] a message via [place]. We typically respond to MyChart  messages within 1-2 business days.  For prescription refills, please ask your pharmacy to contact our office. Our fax number is [phone].  If you have an urgent issue when the clinic is closed that cannot wait until the next business day, you can page your doctor at the number below.    Please note that while we do our best to be available for urgent issues outside of office hours, we are not available 24/7.   If you have an urgent issue and are unable to reach [place], you may choose to seek medical care at your doctor's office, retail clinic, urgent care center, or emergency room.  If you have a medical emergency, please immediately call 911 or go to the emergency department.  Pager Numbers  - Dr. Nehemiah Massed: [phone]  - Dr. Laurence Ferrari: [phone]  - Dr. Nicole Kindred: [phone]  In the event of inclement weather, please call our main line at [phone] for an update on the status of any delays or closures.  Dermatology Medication Tips: Please keep the boxes that topical medications come in in order to help keep track of the instructions about where and how to use these. Pharmacies typically print the medication instructions only on the boxes and not directly on the medication tubes.   If your medication is too expensive, please contact our office at [phone] option 4 or send [place] a message through [place].   We are unable to tell what your co-pay for medications will be in advance as this is different depending on your insurance coverage. However, we may be able to find  a substitute medication at lower cost or fill out paperwork to get insurance to cover a needed medication.   If a prior authorization is required to get your medication covered by your insurance company, please allow [place] 1-2 business days to complete this process.  Drug prices often vary depending on where the prescription is filled and some pharmacies may offer cheaper prices.  The website www.goodrx.com contains  coupons for medications through different pharmacies. The prices here do not account for what the cost may be with help from insurance (it may be cheaper with your insurance), but the website can give you the price if you did not use any insurance.  - You can print the associated coupon and take it with your prescription to the pharmacy.  - You may also stop by our office during regular business hours and pick up a GoodRx coupon card.  - If you need your prescription sent electronically to a different pharmacy, notify our office through [facility] or by phone at [phone] option 4. Cryotherapy Aftercare  Wash gently with soap and water everyday.   Apply Vaseline and Band-Aid daily until healed.

## 2020-11-05 ENCOUNTER — Telehealth: Payer: Self-pay

## 2020-11-05 NOTE — Progress Notes (Signed)
Left message for patient to call back and schedule Medicare Annual Wellness Visit (AWV).    Please offer to do virtually or by telephone sometime this week    Last AWV:09/19/2019    Please schedule at anytime with [facility] Health Advisor.    If any questions, please contact me at [address], [address], [place], [place] [address] Direct Dial: [phone] [email or web address]: [email or web address]

## 2020-11-11 ENCOUNTER — Institutional Professional Consult (permissible substitution): Payer: PPO | Admitting: Pulmonary Disease

## 2020-12-02 ENCOUNTER — Telehealth: Payer: Self-pay | Admitting: Family Medicine

## 2020-12-02 ENCOUNTER — Other Ambulatory Visit: Payer: Self-pay | Admitting: Family Medicine

## 2020-12-02 DIAGNOSIS — N529 Male erectile dysfunction, unspecified: Secondary | ICD-10-CM

## 2020-12-02 MED ORDER — SILDENAFIL CITRATE 20 MG PO TABS: ORAL_TABLET | ORAL | 0 refills | Status: DC

## 2020-12-02 NOTE — Telephone Encounter (Signed)
Please review. KW 

## 2020-12-02 NOTE — Telephone Encounter (Signed)
[facility] faxed refill request for the following medications:   sildenafil (REVATIO) 20 MG tablet   Please advise.

## 2020-12-17 ENCOUNTER — Other Ambulatory Visit: Payer: Self-pay | Admitting: Dermatology

## 2020-12-31 ENCOUNTER — Other Ambulatory Visit: Payer: Self-pay

## 2020-12-31 ENCOUNTER — Ambulatory Visit: Payer: PPO | Admitting: Dermatology

## 2020-12-31 DIAGNOSIS — L570 Actinic keratosis: Secondary | ICD-10-CM

## 2020-12-31 DIAGNOSIS — L821 Other seborrheic keratosis: Secondary | ICD-10-CM

## 2020-12-31 DIAGNOSIS — Z79899 Other long term (current) drug therapy: Secondary | ICD-10-CM | POA: Diagnosis not present

## 2020-12-31 DIAGNOSIS — L2081 Atopic neurodermatitis: Secondary | ICD-10-CM | POA: Diagnosis not present

## 2020-12-31 DIAGNOSIS — L578 Other skin changes due to chronic exposure to nonionizing radiation: Secondary | ICD-10-CM

## 2020-12-31 DIAGNOSIS — L739 Follicular disorder, unspecified: Secondary | ICD-10-CM | POA: Diagnosis not present

## 2020-12-31 DIAGNOSIS — L281 Prurigo nodularis: Secondary | ICD-10-CM

## 2020-12-31 MED ORDER — CLOBETASOL PROPIONATE 0.05 % EX CREA: TOPICAL_CREAM | CUTANEOUS | 3 refills | Status: DC

## 2020-12-31 MED ORDER — HYDROXYZINE HCL 10 MG PO TABS: ORAL_TABLET | ORAL | 2 refills | Status: DC

## 2020-12-31 NOTE — Progress Notes (Signed)
Follow-Up Visit   Subjective  Edward Rodriguez is a 80 y.o. male who presents for the following: Actinic Keratosis (R shoulder, L cheek, R forehead, 2nd webspace L 3rd finger - check for new or persistent lesions today. ).  He also has persistent bx-proven atopic dermatitis and is still itching on back, esp L back, after areas were frozen last visit.  He was on Dupixent in the past, but stopped it.  Wants to start again but still needs to call [place] [employer] for financial assistance.  Currently uses Clob/CeraVe mixture.  Has used TMC cream and Eucrisa in past.  Also takes hydroxyzine prn itch    The following portions of the chart were reviewed this encounter and updated as appropriate:       Review of Systems:  No other skin or systemic complaints except as noted in HPI or Assessment and Plan.  Objective  Well appearing patient in no apparent distress; mood and affect are within normal limits.  A focused examination was performed including the face, trunk, and extremities. Relevant physical exam findings are noted in the Assessment and Plan.  Scalp Excoriated pink papules and pustules.  Scalp x 5 (5) Pink scaly papules  Trunk, extremities Multiple pink excoriated papules on the R shoulder, R post shoulder, flank, and nodules L back.    Assessment & Plan  Folliculitis Scalp  Improved, but not clear  Continue Clindamycin solution to aa's QD-BID.   Continue Head and Shoulders shampoo daily.   AK (actinic keratosis) (5) Scalp x 5  HyAk Vs ISK   Destruction of lesion - Scalp x 5  Destruction method: cryotherapy   Informed consent: discussed and consent obtained   Lesion destroyed using liquid nitrogen: Yes   Region frozen until ice ball extended beyond lesion: Yes   Outcome: patient tolerated procedure well with no complications   Post-procedure details: wound care instructions given   Additional details:  Prior to procedure, discussed risks of blister formation, small  wound, skin dyspigmentation, or rare scar following cryotherapy. Recommend Vaseline ointment to treated areas while healing.   Atopic neurodermatitis Trunk, extremities  With prurigo nodules - with severe persistent pruritus and excoriations.  he has failed treatment with topical corticosteroids; Eucrisa topically and antihistamines.    Atopic dermatitis (eczema) is a chronic, relapsing, pruritic condition that can significantly affect quality of life. It is often associated with allergic rhinitis and/or asthma and can require treatment with topical medications, phototherapy, or in severe cases biologic injectable medication (Dupixent; Adbry) or Oral JAK inhibitors.  Continue Hydroxyzine 10mg  1-2 tabs po QHS PRN itch.   Start Clobetasol 0.05% cream apply to aa's back BID PRN itchy rash.   Continue Clobetasol/CeraVe cream to body qd/bid  Topical steroids (such as triamcinolone, fluocinolone, fluocinonide, mometasone, clobetasol, halobetasol, betamethasone, hydrocortisone) can cause thinning and lightening of the skin if they are used for too long in the same area. Your physician has selected the right strength medicine for your problem and area affected on the body. Please use your medication only as directed by your physician to prevent side effects.   Recommend restarting Dupixent injections. Patient previously filled out [place] [employer] form. Gave patient their phone number and advised him to call them to find out what steps he needs to take next.   Dupilumab (Dupixent) is a treatment given by injection for adults and children with moderate-to-severe atopic dermatitis. Goal is control of skin condition, not cure. It is given as 2 injections at the first  dose followed by 1 injection ever 2 weeks thereafter.  Young children are dosed monthly.  Potential side effects include allergic reaction, herpes infections, injection site reactions and conjunctivitis (inflammation of the eyes).  The use of  Dupixent requires long term medication management, including periodic office visits.   clobetasol cream (TEMOVATE) 0.05 % - Trunk, extremities Apply to aa's eczema QD-BID PRN. Avoid the face, groin, and axilla.  Related Medications dupilumab (DUPIXENT) 300 MG/2ML prefilled syringe Inject 300 mg into the skin every 14 (fourteen) days. Starting at day 15 for maintenance.  clobetasol (TEMOVATE) 0.05 % external solution Patient to mix clobetasol solution in jar of CeraVe Cream. Apply to affected areas rash twice daily until improved. Avoid face, groin, axilla.  hydrOXYzine (ATARAX/VISTARIL) 10 MG tablet Take 1-2 tablets by mouth every evening as needed for itch.  Actinic Damage - chronic, secondary to cumulative UV radiation exposure/sun exposure over time - diffuse scaly erythematous macules with underlying dyspigmentation - Recommend daily broad spectrum sunscreen SPF 30+ to sun-exposed areas, reapply every 2 hours as needed.  - Recommend staying in the shade or wearing long sleeves, sun glasses (UVA+UVB protection) and wide brim hats (4-inch brim around the entire circumference of the hat). - Call for new or changing lesions.  Seborrheic Keratoses - Stuck-on, waxy, tan-brown papules and/or plaques  - Benign-appearing - Discussed benign etiology and prognosis. - Observe - Call for any changes  Return in about 2 months (around 03/02/2021) for AD and AK f/u .  Luther Redo, CMA, am acting as scribe for Brendolyn Patty, MD .  Documentation: I have reviewed the above documentation for accuracy and completeness, and I agree with the above.  Brendolyn Patty MD

## 2020-12-31 NOTE — Patient Instructions (Signed)

## 2021-01-03 ENCOUNTER — Other Ambulatory Visit: Payer: Self-pay | Admitting: Family Medicine

## 2021-01-03 DIAGNOSIS — N529 Male erectile dysfunction, unspecified: Secondary | ICD-10-CM

## 2021-01-04 NOTE — Telephone Encounter (Signed)
Requested Prescriptions  Pending Prescriptions Disp Refills  . sildenafil (REVATIO) 20 MG tablet [Pharmacy Med Name: Sildenafil Citrate 20 MG Oral Tablet] 50 tablet 0    Sig: TAKE 2 TO 5 TABLETS BY MOUTH ONCE DAILY FOR  DIFFICULTY  OBTAINING/  MAINTAINING  AN  ERECTION     Urology: Erectile Dysfunction Agents Passed - 01/03/2021 11:09 AM      Passed - Last BP in normal range    BP Readings from Last 1 Encounters:  10/01/20 126/81         Passed - Valid encounter within last 12 months    Recent Outpatient Visits          3 months ago SOB (shortness of breath)   [facility] Tally Joe T, FNP   4 months ago GAD (generalized anxiety disorder)   [facility], Dionne Bucy, MD   1 year ago Allergic conjunctivitis of both eyes   [place], [place], [place]   1 year ago Rash and nonspecific skin eruption   [facility] [place], Clearnce Sorrel, [place]   2 years ago Parotitis   [place], Clearnce Sorrel, [place]      Future Appointments            In 1 month Brendolyn Patty, MD [place]

## 2021-01-07 DIAGNOSIS — M1712 Unilateral primary osteoarthritis, left knee: Secondary | ICD-10-CM | POA: Diagnosis not present

## 2021-01-22 DIAGNOSIS — M180 Bilateral primary osteoarthritis of first carpometacarpal joints: Secondary | ICD-10-CM | POA: Diagnosis not present

## 2021-01-24 ENCOUNTER — Other Ambulatory Visit: Payer: Self-pay | Admitting: Family Medicine

## 2021-01-24 DIAGNOSIS — N529 Male erectile dysfunction, unspecified: Secondary | ICD-10-CM

## 2021-02-15 ENCOUNTER — Other Ambulatory Visit: Payer: Self-pay | Admitting: Family Medicine

## 2021-02-15 DIAGNOSIS — N529 Male erectile dysfunction, unspecified: Secondary | ICD-10-CM

## 2021-02-15 NOTE — Telephone Encounter (Signed)
Requested Prescriptions  Pending Prescriptions Disp Refills   sildenafil (REVATIO) 20 MG tablet [Pharmacy Med Name: Sildenafil Citrate 20 MG Oral Tablet] 50 tablet 0    Sig: TAKE 2 TO 5 TABLETS BY MOUTH ONCE DAILY AS NEEDED FOR DIFFICULTY OBTAINING/MAINTAINING ERECTION     Urology: Erectile Dysfunction Agents Passed - 02/15/2021  3:18 PM      Passed - Last BP in normal range    BP Readings from Last 1 Encounters:  10/01/20 126/81         Passed - Valid encounter within last 12 months    Recent Outpatient Visits          4 months ago SOB (shortness of breath)   [facility] Tally Joe T, FNP   5 months ago GAD (generalized anxiety disorder)   [facility], Dionne Bucy, MD   1 year ago Allergic conjunctivitis of both eyes   [place], [place], [place]   2 years ago Rash and nonspecific skin eruption   [facility] [place], Clearnce Sorrel, [place]   2 years ago Parotitis   [place], Clearnce Sorrel, [place]      Future Appointments            In 1 week Brendolyn Patty, MD [place]

## 2021-02-25 ENCOUNTER — Ambulatory Visit: Payer: PPO | Admitting: Dermatology

## 2021-02-25 ENCOUNTER — Other Ambulatory Visit: Payer: Self-pay

## 2021-02-25 DIAGNOSIS — L570 Actinic keratosis: Secondary | ICD-10-CM

## 2021-02-25 DIAGNOSIS — Z872 Personal history of diseases of the skin and subcutaneous tissue: Secondary | ICD-10-CM

## 2021-02-25 DIAGNOSIS — L209 Atopic dermatitis, unspecified: Secondary | ICD-10-CM

## 2021-02-25 DIAGNOSIS — L2081 Atopic neurodermatitis: Secondary | ICD-10-CM | POA: Diagnosis not present

## 2021-02-25 DIAGNOSIS — L739 Follicular disorder, unspecified: Secondary | ICD-10-CM

## 2021-02-25 MED ORDER — KETOCONAZOLE 2 % EX SHAM: 1.0000 "application " | MEDICATED_SHAMPOO | Freq: Once | CUTANEOUS | 4 refills | Status: AC

## 2021-02-25 MED ORDER — HYDROXYZINE HCL 10 MG PO TABS: ORAL_TABLET | ORAL | 2 refills | Status: DC

## 2021-02-25 NOTE — Patient Instructions (Addendum)
Continue Clobetasol 0.05% cream apply to affected areas back one to twice daily as needed for itchy rash.  Avoid applying to face, groin, and axilla. Use as directed. Long-term use can cause thinning of the skin.  Continue Hydroxyzine 10mg  1-2 tabs by mouth at night as needed for itch.   Continue Clobetasol/CeraVe cream apply to body daily to twice daily.   Topical steroids (such as triamcinolone, fluocinolone, fluocinonide, mometasone, clobetasol, halobetasol, betamethasone, hydrocortisone) can cause thinning and lightening of the skin if they are used for too long in the same area. Your physician has selected the right strength medicine for your problem and area affected on the body. Please use your medication only as directed by your physician to prevent side effects.     If You Need Anything After Your Visit  If you have any questions or concerns for your doctor, please call our main line at [phone] and press option 4 to reach your doctor's medical assistant. If no one answers, please leave a voicemail as directed and we will return your call as soon as possible. Messages left after 4 pm will be answered the following business day.   You may also send [place] a message via [place]. We typically respond to MyChart messages within 1-2 business days.  For prescription refills, please ask your pharmacy to contact our office. Our fax number is [phone].  If you have an urgent issue when the clinic is closed that cannot wait until the next business day, you can page your doctor at the number below.    Please note that while we do our best to be available for urgent issues outside of office hours, we are not available 24/7.   If you have an urgent issue and are unable to reach [place], you may choose to seek medical care at your doctor's office, retail clinic, urgent care center, or emergency room.  If you have a medical emergency, please immediately call 911 or go to the emergency  department.  Pager Numbers  - Dr. Nehemiah Massed: [phone]  - Dr. Laurence Ferrari: [phone]  - Dr. Nicole Kindred: [phone]  In the event of inclement weather, please call our main line at [phone] for an update on the status of any delays or closures.  Dermatology Medication Tips: Please keep the boxes that topical medications come in in order to help keep track of the instructions about where and how to use these. Pharmacies typically print the medication instructions only on the boxes and not directly on the medication tubes.   If your medication is too expensive, please contact our office at [phone] option 4 or send [place] a message through [place].   We are unable to tell what your co-pay for medications will be in advance as this is different depending on your insurance coverage. However, we may be able to find a substitute medication at lower cost or fill out paperwork to get insurance to cover a needed medication.   If a prior authorization is required to get your medication covered by your insurance company, please allow [place] 1-2 business days to complete this process.  Drug prices often vary depending on where the prescription is filled and some pharmacies may offer cheaper prices.  The website www.goodrx.com contains coupons for medications through different pharmacies. The prices here do not account for what the cost may be with help from insurance (it may be cheaper with your insurance), but the website can give you the price if you did not use any insurance.  -  You can print the associated coupon and take it with your prescription to the pharmacy.  - You may also stop by our office during regular business hours and pick up a GoodRx coupon card.  - If you need your prescription sent electronically to a different pharmacy, notify our office through [facility] or by phone at [phone] option 4.     Si Usted Necesita Algo Despus de Su Visita  Tambin puede enviarnos un  mensaje a travs de Pharmacist, community. Por lo general respondemos a los mensajes de MyChart en el transcurso de 1 a 2 das hbiles.  Para renovar recetas, por favor pida a su farmacia que se ponga en contacto con nuestra oficina. Harland Dingwall de fax es [place] [phone].  Si tiene un asunto urgente cuando la clnica est cerrada y que no puede esperar hasta el siguiente da hbil, puede llamar/localizar a su doctor(a) al nmero que aparece a continuacin.   Por favor, tenga en cuenta que aunque hacemos todo lo posible para estar disponibles para asuntos urgentes fuera del horario de [place], no estamos disponibles las 24 horas del da, los 7 das de la [place].   Si tiene un problema urgente y no puede comunicarse con nosotros, puede optar por buscar atencin mdica  en el consultorio de su doctor(a), en una clnica privada, en un centro de atencin urgente o en una sala de emergencias.  Si tiene Engineering geologist, por favor llame inmediatamente al 911 o vaya a la sala de emergencias.  Nmeros de bper  - Dr. Nehemiah Massed: [phone]  - Dra. Moye: [phone]  - Dra. Nicole Kindred: [phone]  En caso de inclemencias del [place], por favor llame a Johnsie Kindred principal al [phone] para una actualizacin sobre el [place] de cualquier retraso o cierre.  Consejos para la medicacin en dermatologa: Por favor, guarde las cajas en las que vienen los medicamentos de uso tpico para ayudarle a seguir las instrucciones sobre dnde y cmo usarlos. Las farmacias generalmente imprimen las instrucciones del medicamento slo en las cajas y no directamente en los tubos del [place].   Si su medicamento es muy caro, por favor, pngase en contacto con Zigmund Daniel llamando al [phone] y presione la opcin 4 o envenos un mensaje a travs de Pharmacist, community.   No podemos decirle cul ser su copago por los medicamentos por adelantado ya que esto es diferente dependiendo de la cobertura de su seguro. Sin embargo,  es posible que podamos encontrar un medicamento sustituto a Electrical engineer un formulario para que el seguro cubra el medicamento que se considera necesario.   Si se requiere una autorizacin previa para que su compaa de seguros [place] su medicamento, por favor permtanos de 1 a 2 das hbiles para completar este proceso.  Los precios de los medicamentos varan con frecuencia dependiendo del Environmental consultant de dnde se surte la receta y alguna farmacias pueden ofrecer precios ms baratos.  El sitio web www.goodrx.com tiene cupones para medicamentos de Airline pilot. Los precios aqu no tienen en cuenta lo que podra costar con la ayuda del seguro (puede ser ms barato con su seguro), pero el sitio web puede darle el precio si no utiliz Research scientist (physical sciences).  - Puede imprimir el cupn correspondiente y llevarlo con su receta a la farmacia.  - Tambin puede pasar por nuestra oficina durante el horario de atencin regular y Charity fundraiser una tarjeta de cupones de GoodRx.  - Si necesita que su receta se enve electrnicamente a una farmacia diferente, informe  a nuestra oficina a travs de MyChart de [place] o por telfono llamando al [phone] y presione la opcin 4.

## 2021-02-25 NOTE — Progress Notes (Signed)
Follow-Up Visit   Subjective  Edward Rodriguez is a 81 y.o. male who presents for the following: Follow-up (Patient here today for atopic dermatitis follow up. ). Pt states the itching is much better since he started using the new cream.  It was switched at the pharmacy because the clobetasol was too expensive.  He is not sure of the name, but thinks it is a miracle cream.  He tried calling Dupixent [facility] and was never able to get through, so he gave up on getting Dupixent for now, especially since he is doing better with topicals.  The following portions of the chart were reviewed this encounter and updated as appropriate:      Review of Systems: No other skin or systemic complaints except as noted in HPI or Assessment and Plan.   Objective  Well appearing patient in no apparent distress; mood and affect are within normal limits.  A focused examination was performed including face, scalp, back, bilateral flanks. Relevant physical exam findings are noted in the Assessment and Plan.  Mid Back Hyperpigmented and  hypopigmented macules on flanks bilateral   scalp x 3, forehead x 2 (5) Erythematous thin papules/macules with gritty scale.   Scalp Clear today   Assessment & Plan  Atopic dermatitis, unspecified type Mid Back  With h/o prurigo nodules -  has improved significantly with resolution of prurigo nodules  Atopic dermatitis (eczema) is a chronic, relapsing, pruritic condition that can significantly affect quality of life. It is often associated with allergic rhinitis and/or asthma and can require treatment with topical medications, phototherapy, or in severe cases biologic injectable medication (Dupixent; Adbry) or Oral JAK inhibitors.  Continue Clobetasol 0.05% cream? apply to aa's back BID PRN itchy rash.  Avoid f/a/g  Patient will let us know what was dispensed. Continue Hydroxyzine 10mg  1-2 tabs po QHS PRN itch.  Continue Clobetasol/CeraVe cream to body qd/bid prn itch Will  hold off on Dupixent for now since he is improved.    Topical steroids (such as triamcinolone, fluocinolone, fluocinonide, mometasone, clobetasol, halobetasol, betamethasone, hydrocortisone) can cause thinning and lightening of the skin if they are used for too long in the same area. Your physician has selected the right strength medicine for your problem and area affected on the body. Please use your medication only as directed by your physician to prevent side effects.        Actinic keratosis (5) scalp x 3, forehead x 2  Hypertrophic aks vs isk at scalp   Actinic keratoses are precancerous spots that appear secondary to cumulative UV radiation exposure/sun exposure over time. They are chronic with expected duration over 1 year. A portion of actinic keratoses will progress to squamous cell carcinoma of the skin. It is not possible to reliably predict which spots will progress to skin cancer and so treatment is recommended to prevent development of skin cancer.  Recommend daily broad spectrum sunscreen SPF 30+ to sun-exposed areas, reapply every 2 hours as needed.  Recommend staying in the shade or wearing long sleeves, sun glasses (UVA+UVB protection) and wide brim hats (4-inch brim around the entire circumference of the hat). Call for new or changing lesions.  Destruction of lesion - scalp x 3, forehead x 2  Destruction method: cryotherapy   Informed consent: discussed and consent obtained   Timeout:  patient name, date of birth, surgical site, and procedure verified Lesion destroyed using liquid nitrogen: Yes   Region frozen until ice ball extended beyond lesion: Yes  Outcome: patient tolerated procedure well with no complications   Post-procedure details: wound care instructions given   Additional details:  Prior to procedure, discussed risks of blister formation, small wound, skin dyspigmentation, or rare scar following cryotherapy. Recommend Vaseline ointment to treated areas  while healing.   Atopic neurodermatitis  Related Medications dupilumab (DUPIXENT) 300 MG/2ML prefilled syringe Inject 300 mg into the skin every 14 (fourteen) days. Starting at day 15 for maintenance.  clobetasol (TEMOVATE) 0.05 % external solution Patient to mix clobetasol solution in jar of CeraVe Cream. Apply to affected areas rash twice daily until improved. Avoid face, groin, axilla.  clobetasol cream (TEMOVATE) 0.05 % Apply to aa's eczema QD-BID PRN. Avoid the face, groin, and axilla.  hydrOXYzine (ATARAX) 10 MG tablet Take 1-2 tablets by mouth every evening as needed for itch.  Folliculitis Scalp  Scalp, Chronic condition, Controlled with treatment  Continue Ketoconazole shampoo as directed Continue clindamycin solution at bedtime prn flares  ketoconazole (NIZORAL) 2 % shampoo - Scalp Apply 1 application topically once for 1 dose. apply 5  times per week, massage into scalp and leave in for 5 minutes before rinsing out   Return in about 3 months (around 05/26/2021) for atopic dermatitis, AKs. I, Ruthell Rummage, CMA, am acting as scribe for Brendolyn Patty, MD.  Documentation: I have reviewed the above documentation for accuracy and completeness, and I agree with the above.  Brendolyn Patty MD

## 2021-03-05 ENCOUNTER — Other Ambulatory Visit: Payer: Self-pay | Admitting: Family Medicine

## 2021-03-05 DIAGNOSIS — N529 Male erectile dysfunction, unspecified: Secondary | ICD-10-CM

## 2021-03-05 DIAGNOSIS — R609 Edema, unspecified: Secondary | ICD-10-CM | POA: Insufficient documentation

## 2021-03-06 ENCOUNTER — Ambulatory Visit: Payer: PPO | Admitting: Family Medicine

## 2021-03-06 NOTE — Progress Notes (Deleted)
°  ° ° °  Established patient visit   Patient: Edward Rodriguez   DOB: [birth year]   81 y.o. Male  MRN: [id number] Visit Date: 03/06/2021  Today's healthcare provider: Gwyneth Sprout, FNP   No chief complaint on file.  Subjective    HPI  ***  Medications: Outpatient Medications Prior to Visit  Medication Sig   albuterol (VENTOLIN HFA) 108 (90 Base) MCG/ACT inhaler Inhale 2 puffs into the lungs every 6 (six) hours as needed for wheezing or shortness of breath.   clindamycin (CLEOCIN T) 1 % external solution APPLY  SOLUTION TOPICALLY TO AFFECTED AREA OF SCALP AT BEDTIME AS NEEDED   clobetasol (TEMOVATE) 0.05 % external solution Patient to mix clobetasol solution in jar of CeraVe Cream. Apply to affected areas rash twice daily until improved. Avoid face, groin, axilla.   clobetasol cream (TEMOVATE) 0.05 % Apply to aa's eczema QD-BID PRN. Avoid the face, groin, and axilla.   dupilumab (DUPIXENT) 300 MG/2ML prefilled syringe Inject 300 mg into the skin every 14 (fourteen) days. Starting at day 15 for maintenance. (Patient not taking: Reported on 10/29/2020)   hydrOXYzine (ATARAX) 10 MG tablet Take 1-2 tablets by mouth every evening as needed for itch.   meloxicam (MOBIC) 15 MG tablet Take 15 mg by mouth daily.    omeprazole (PRILOSEC) 20 MG capsule Take 40 mg by mouth every other day.   pramoxine-hydrocortisone (PROCTOCREAM-HC) 1-1 % rectal cream Place rectally 3 (three) times daily. As needed for hemorrhoids   sildenafil (REVATIO) 20 MG tablet TAKE TWO TO FIVE TABLETS BY MOUTH ONCE DAILY AS NEEDED FOR DIFFICULTY OBTAINING/MAINTAINING ERECTION   SSD 1 % cream APPLY TOPICALLY DAILY   triamcinolone (KENALOG) 0.1 % APPLY  CREAM EXTERNALLY TO AFFECTED AREA TWICE DAILY RASH  OR  ITCHING AS NEEDED.  AVOID  FACE,  GROIN  AND  AXILLA.   venlafaxine XR (EFFEXOR-XR) 150 MG 24 hr capsule Take 1 capsule (150 mg total) by mouth daily with breakfast.   No facility-administered medications prior to visit.     Review of Systems  {Labs   Heme   Chem   Endocrine   Serology   Results Review (optional):23779}   Objective    There were no vitals taken for this visit. {Show previous vital signs (optional):23777}  Physical Exam  ***  No results found for any visits on 03/06/21.  Assessment & Plan     ***  No follow-ups on file.      {provider attestation***:1}   Gwyneth Sprout, [place] [phone] (phone) [phone] (fax)  [place]

## 2021-03-09 ENCOUNTER — Other Ambulatory Visit: Payer: Self-pay | Admitting: Family Medicine

## 2021-03-09 DIAGNOSIS — F419 Anxiety disorder, unspecified: Secondary | ICD-10-CM

## 2021-03-09 NOTE — Telephone Encounter (Signed)
Requested medication (s) are due for refill today - yes  Requested medication (s) are on the active medication list -yes  Future visit scheduled -no  Last refill: 09/05/20 #90 1RF  Notes to clinic: Request RF: fails lab protocol- 2017  Requested Prescriptions  Pending Prescriptions Disp Refills   venlafaxine XR (EFFEXOR-XR) 150 MG 24 hr capsule [Pharmacy Med Name: Venlafaxine HCl ER 150 MG Oral Capsule Extended Release 24 Hour] 90 capsule 0    Sig: TAKE 1 CAPSULE BY MOUTH ONCE DAILY WITH BREAKFAST     Psychiatry: Antidepressants - SNRI - desvenlafaxine & venlafaxine Failed - 03/09/2021  9:31 AM      Failed - LDL in normal range and within 360 days    LDL Calculated  Date Value Ref Range Status  08/20/2015 100 (H) 0 - 99 mg/dL Final          Failed - Total Cholesterol in normal range and within 360 days    Cholesterol, Total  Date Value Ref Range Status  08/20/2015 177 100 - 199 mg/dL Final          Failed - Triglycerides in normal range and within 360 days    Triglycerides  Date Value Ref Range Status  08/20/2015 128 0 - 149 mg/dL Final          Passed - Last BP in normal range    BP Readings from Last 1 Encounters:  10/01/20 126/81          Passed - Valid encounter within last 6 months    Recent Outpatient Visits           5 months ago SOB (shortness of breath)   [facility] Gwyneth Sprout, FNP   6 months ago GAD (generalized anxiety disorder)   [facility], Dionne Bucy, [place]   1 year ago Allergic conjunctivitis of both eyes   [facility] Fenton Malling M, [place]   2 years ago Rash and nonspecific skin eruption   [facility] [place], [place], [place]   2 years ago Parotitis   [place], Clearnce Sorrel, [place]       Future Appointments             In 2 months Brendolyn Patty, MD [place]               Requested Prescriptions  Pending Prescriptions  Disp Refills   venlafaxine XR (EFFEXOR-XR) 150 MG 24 hr capsule [Pharmacy Med Name: Venlafaxine HCl ER 150 MG Oral Capsule Extended Release 24 Hour] 90 capsule 0    Sig: TAKE 1 CAPSULE BY MOUTH ONCE DAILY WITH BREAKFAST     Psychiatry: Antidepressants - SNRI - desvenlafaxine & venlafaxine Failed - 03/09/2021  9:31 AM      Failed - LDL in normal range and within 360 days    LDL Calculated  Date Value Ref Range Status  08/20/2015 100 (H) 0 - 99 mg/dL Final          Failed - Total Cholesterol in normal range and within 360 days    Cholesterol, Total  Date Value Ref Range Status  08/20/2015 177 100 - 199 mg/dL Final          Failed - Triglycerides in normal range and within 360 days    Triglycerides  Date Value Ref Range Status  08/20/2015 128 0 - 149 mg/dL Final          Passed - Last BP  in normal range    BP Readings from Last 1 Encounters:  10/01/20 126/81          Passed - Valid encounter within last 6 months    Recent Outpatient Visits           5 months ago SOB (shortness of breath)   [facility] Tally Joe T, FNP   6 months ago GAD (generalized anxiety disorder)   [facility], Dionne Bucy, MD   1 year ago Allergic conjunctivitis of both eyes   [facility] Fenton Malling M, [place]   2 years ago Rash and nonspecific skin eruption   [facility], Clearnce Sorrel, [place]   2 years ago Parotitis   [place], Clearnce Sorrel, [place]       Future Appointments             In 2 months Brendolyn Patty, MD [place]

## 2021-03-14 DIAGNOSIS — Z96642 Presence of left artificial hip joint: Secondary | ICD-10-CM | POA: Diagnosis not present

## 2021-03-25 ENCOUNTER — Telehealth: Payer: Self-pay | Admitting: Family Medicine

## 2021-03-25 ENCOUNTER — Telehealth: Payer: Self-pay

## 2021-03-25 NOTE — Telephone Encounter (Signed)
Copied from [place] [id number]. Topic: Appointment Scheduling - Scheduling Inquiry for Clinic >> Mar 25, 2021  9:51 AM Greggory Keen D wrote: Reason for CRM: pt called to schedule a MWV.  He said to leave him a message if you dont get him/  CB#  [phone]

## 2021-03-25 NOTE — Telephone Encounter (Signed)
Patient called in cant, make appt today. 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

## 2021-04-07 ENCOUNTER — Other Ambulatory Visit: Payer: Self-pay | Admitting: Dermatology

## 2021-04-07 ENCOUNTER — Ambulatory Visit (INDEPENDENT_AMBULATORY_CARE_PROVIDER_SITE_OTHER): Payer: PPO

## 2021-04-07 ENCOUNTER — Other Ambulatory Visit: Payer: Self-pay

## 2021-04-07 ENCOUNTER — Other Ambulatory Visit: Payer: Self-pay | Admitting: Family Medicine

## 2021-04-07 VITALS — BP 120/62 | HR 74 | Temp 98.1°F | Ht 71.0 in | Wt 198.5 lb

## 2021-04-07 DIAGNOSIS — K219 Gastro-esophageal reflux disease without esophagitis: Secondary | ICD-10-CM | POA: Insufficient documentation

## 2021-04-07 DIAGNOSIS — N529 Male erectile dysfunction, unspecified: Secondary | ICD-10-CM

## 2021-04-07 DIAGNOSIS — F419 Anxiety disorder, unspecified: Secondary | ICD-10-CM | POA: Insufficient documentation

## 2021-04-07 DIAGNOSIS — M25559 Pain in unspecified hip: Secondary | ICD-10-CM | POA: Insufficient documentation

## 2021-04-07 DIAGNOSIS — Z0000 Encounter for general adult medical examination without abnormal findings: Secondary | ICD-10-CM

## 2021-04-07 DIAGNOSIS — G4700 Insomnia, unspecified: Secondary | ICD-10-CM | POA: Insufficient documentation

## 2021-04-07 NOTE — Patient Instructions (Signed)
[name] , Thank you for taking time to come for your Medicare Wellness Visit. I appreciate your ongoing commitment to your health goals. Please review the following plan we discussed and let me know if I can assist you in the future.   Screening recommendations/referrals: Colonoscopy: aged out Recommended yearly ophthalmology/optometry visit for glaucoma screening and checkup Recommended yearly dental visit for hygiene and checkup  Vaccinations: Influenza vaccine: 11/08/20 Pneumococcal vaccine: 07/13/13 Tdap vaccine: 08/31/17 Shingles vaccine: n/d   Covid-19: 03/02/19, 03/23/19, 01/22/20, 11/08/20  Advanced directives: no  Conditions/risks identified: none  Next appointment: Follow up in one year for your annual wellness visit. 04/08/22 @ 11am in person  Preventive Care 65 Years and Older, Male Preventive care refers to lifestyle choices and visits with your health care provider that can promote health and wellness. What does preventive care include? A yearly physical exam. This is also called an annual well check. Dental exams once or twice a year. Routine eye exams. Ask your health care provider how often you should have your eyes checked. Personal lifestyle choices, including: Daily care of your teeth and gums. Regular physical activity. Eating a healthy diet. Avoiding tobacco and drug use. Limiting alcohol use. Practicing safe sex. Taking low doses of aspirin every day. Taking vitamin and mineral supplements as recommended by your health care provider. What happens during an annual well check? The services and screenings done by your health care provider during your annual well check will depend on your age, overall health, lifestyle risk factors, and family history of disease. Counseling  Your health care provider may ask you questions about your: Alcohol use. Tobacco use. Drug use. Emotional well-being. Home and relationship well-being. Sexual activity. Eating  habits. History of falls. Memory and ability to understand (cognition). Work and work Statistician. Screening  You may have the following tests or measurements: Height, weight, and BMI. Blood pressure. Lipid and cholesterol levels. These may be checked every 5 years, or more frequently if you are over 62 years old. Skin check. Lung cancer screening. You may have this screening every year starting at age 17 if you have a 30-pack-year history of smoking and currently smoke or have quit within the past 15 years. Fecal occult blood test (FOBT) of the stool. You may have this test every year starting at age 46. Flexible sigmoidoscopy or colonoscopy. You may have a sigmoidoscopy every 5 years or a colonoscopy every 10 years starting at age 11. Prostate cancer screening. Recommendations will vary depending on your family history and other risks. Hepatitis C blood test. Hepatitis B blood test. Sexually transmitted disease (STD) testing. Diabetes screening. This is done by checking your blood sugar (glucose) after you have not eaten for a while (fasting). You may have this done every 1-3 years. Abdominal aortic aneurysm (AAA) screening. You may need this if you are a current or former smoker. Osteoporosis. You may be screened starting at age 74 if you are at high risk. Talk with your health care provider about your test results, treatment options, and if necessary, the need for more tests. Vaccines  Your health care provider may recommend certain vaccines, such as: Influenza vaccine. This is recommended every year. Tetanus, diphtheria, and acellular pertussis (Tdap, Td) vaccine. You may need a Td booster every 10 years. Zoster vaccine. You may need this after age 67. Pneumococcal 13-valent conjugate (PCV13) vaccine. One dose is recommended after age 61. Pneumococcal polysaccharide (PPSV23) vaccine. One dose is recommended after age 73. Talk to your health  care provider about which screenings and  vaccines you need and how often you need them. This information is not intended to replace advice given to you by your health care provider. Make sure you discuss any questions you have with your health care provider. Document Released: 02/22/2015 Document Revised: 10/16/2015 Document Reviewed: 11/27/2014 Elsevier Interactive Patient Education  2017 [place] Prevention in the Home Falls can cause injuries. They can happen to people of all ages. There are many things you can do to make your home safe and to help prevent falls. What can I do on the outside of my home? Regularly fix the edges of walkways and driveways and fix any cracks. Remove anything that might make you trip as you walk through a door, such as a raised step or threshold. Trim any bushes or trees on the path to your home. Use bright outdoor lighting. Clear any walking paths of anything that might make someone trip, such as rocks or tools. Regularly check to see if handrails are loose or broken. Make sure that both sides of any steps have handrails. Any raised decks and porches should have guardrails on the edges. Have any leaves, snow, or ice cleared regularly. Use sand or salt on walking paths during winter. Clean up any spills in your garage right away. This includes oil or grease spills. What can I do in the bathroom? Use night lights. Install grab bars by the toilet and in the tub and shower. Do not use towel bars as grab bars. Use non-skid mats or decals in the tub or shower. If you need to sit down in the shower, use a plastic, non-slip stool. Keep the floor dry. Clean up any water that spills on the floor as soon as it happens. Remove soap buildup in the tub or shower regularly. Attach bath mats securely with double-sided non-slip rug tape. Do not have throw rugs and other things on the floor that can make you trip. What can I do in the bedroom? Use night lights. Make sure that you have a light by your  bed that is easy to reach. Do not use any sheets or blankets that are too big for your bed. They should not hang down onto the floor. Have a firm chair that has side arms. You can use this for support while you get dressed. Do not have throw rugs and other things on the floor that can make you trip. What can I do in the kitchen? Clean up any spills right away. Avoid walking on wet floors. Keep items that you use a lot in easy-to-reach places. If you need to reach something above you, use a strong step stool that has a grab bar. Keep electrical cords out of the way. Do not use floor polish or wax that makes floors slippery. If you must use wax, use non-skid floor wax. Do not have throw rugs and other things on the floor that can make you trip. What can I do with my stairs? Do not leave any items on the stairs. Make sure that there are handrails on both sides of the stairs and use them. Fix handrails that are broken or loose. Make sure that handrails are as long as the stairways. Check any carpeting to make sure that it is firmly attached to the stairs. Fix any carpet that is loose or worn. Avoid having throw rugs at the top or bottom of the stairs. If you do have throw rugs, attach them to  the floor with carpet tape. Make sure that you have a light switch at the top of the stairs and the bottom of the stairs. If you do not have them, ask someone to add them for you. What else can I do to help prevent falls? Wear shoes that: Do not have high heels. Have rubber bottoms. Are comfortable and fit you well. Are closed at the toe. Do not wear sandals. If you use a stepladder: Make sure that it is fully opened. Do not climb a closed stepladder. Make sure that both sides of the stepladder are locked into place. Ask someone to hold it for you, if possible. Clearly mark and make sure that you can see: Any grab bars or handrails. First and last steps. Where the edge of each step is. Use tools that  help you move around (mobility aids) if they are needed. These include: Canes. Walkers. Scooters. Crutches. Turn on the lights when you go into a dark area. Replace any light bulbs as soon as they burn out. Set up your furniture so you have a clear path. Avoid moving your furniture around. If any of your floors are uneven, fix them. If there are any pets around you, be aware of where they are. Review your medicines with your doctor. Some medicines can make you feel dizzy. This can increase your chance of falling. Ask your doctor what other things that you can do to help prevent falls. This information is not intended to replace advice given to you by your health care provider. Make sure you discuss any questions you have with your health care provider. Document Released: 11/22/2008 Document Revised: 07/04/2015 Document Reviewed: 03/02/2014 Elsevier Interactive Patient Education  2017 [employer].

## 2021-04-07 NOTE — Progress Notes (Signed)
Subjective:   Edward Rodriguez is a 81 y.o. male who presents for Medicare Annual/Subsequent preventive examination.  Review of Systems           Objective:    Today's Vitals   04/07/21 1027  BP: 120/62  Pulse: 74  Temp: 98.1 F (36.7 C)  TempSrc: Oral  SpO2: 93%  Weight: 198 lb 8 oz (90 kg)  Height: 5\' 11"  (1.803 m)   Body mass index is 27.69 kg/m.  Advanced Directives 09/19/2019 07/16/2014 06/27/2014 06/19/2014  Does Patient Have a Medical Advance Directive? Yes Yes Yes Yes  Type of Paramedic of [place];Living will Living will [place] will  Does patient want to make changes to medical advance directive? - No - Patient declined - No - Patient declined  Copy of [place] in Chart? No - copy requested No - copy requested No - copy requested No - copy requested  Would patient like information on creating a medical advance directive? - - No - patient declined information -    Current Medications (verified) Outpatient Encounter Medications as of 04/07/2021  Medication Sig   albuterol (VENTOLIN HFA) 108 (90 Base) MCG/ACT inhaler Inhale 2 puffs into the lungs every 6 (six) hours as needed for wheezing or shortness of breath.   clindamycin (CLEOCIN T) 1 % external solution APPLY  SOLUTION TOPICALLY TO AFFECTED AREA OF SCALP AT BEDTIME AS NEEDED   clobetasol (TEMOVATE) 0.05 % external solution Patient to mix clobetasol solution in jar of CeraVe Cream. Apply to affected areas rash twice daily until improved. Avoid face, groin, axilla.   clobetasol cream (TEMOVATE) 0.05 % Apply to aa's eczema QD-BID PRN. Avoid the face, groin, and axilla.   dupilumab (DUPIXENT) 300 MG/2ML prefilled syringe Inject 300 mg into the skin every 14 (fourteen) days. Starting at day 15 for maintenance. (Patient not taking: Reported on 10/29/2020)   FLUZONE HIGH-DOSE QUADRIVALENT 0.7 ML SUSY    hydrOXYzine (ATARAX) 10 MG tablet Take 1-2 tablets by  mouth every evening as needed for itch.   ketoconazole (NIZORAL) 2 % shampoo ketoconazole 2 % shampoo  APPLY TOPICALLY ONCE FOR ONE DOSE. APPLY FIVE TIMES PER WEEK. MASSAGE INTO SCALP AND LEAVE IN FOR FIVE MINUTES BEFORE RINSING OUT   meloxicam (MOBIC) 15 MG tablet Take 15 mg by mouth daily.    omeprazole (PRILOSEC) 20 MG capsule Take 40 mg by mouth every other day.   PFIZER COVID-19 VAC BIVALENT injection    pramoxine-hydrocortisone (PROCTOCREAM-HC) 1-1 % rectal cream Place rectally 3 (three) times daily. As needed for hemorrhoids   sildenafil (REVATIO) 20 MG tablet TAKE TWO TO FIVE TABLETS BY MOUTH ONCE DAILY AS NEEDED FOR DIFFICULTY OBTAINING/MAINTAINING ERECTION   SSD 1 % cream APPLY TOPICALLY DAILY   triamcinolone (KENALOG) 0.1 % APPLY  CREAM EXTERNALLY TO AFFECTED AREA TWICE DAILY RASH  OR  ITCHING AS NEEDED.  AVOID  FACE,  GROIN  AND  AXILLA.   venlafaxine XR (EFFEXOR-XR) 150 MG 24 hr capsule TAKE 1 CAPSULE BY MOUTH ONCE DAILY WITH BREAKFAST   No facility-administered encounter medications on file as of 04/07/2021.    Allergies (verified) Sulfa antibiotics and Elemental sulfur   History: Past Medical History:  Diagnosis Date   Actinic keratosis 07/26/2007   R dorsum index finger - bx proven    Actinic keratosis 01/25/2008   L inf helix - bx proven    Actinic keratosis 05/16/2009   L lat canthus - bx  proven    Anxiety    GERD (gastroesophageal reflux disease)    Vertigo 06/2014   had an episode in 2013.   Past Surgical History:  Procedure Laterality Date   ROTATOR CUFF REPAIR Left 06/2014   SHOULDER ARTHROSCOPY Left 06/27/2014   Procedure: ARTHROSCOPY SHOULDER- mini open rotator cuff repair;  Surgeon: Thornton Park, MD;  Location: [facility] ORS;  Service: Orthopedics;  Laterality: Left;   SHOULDER OPEN ROTATOR CUFF REPAIR Right 2011   Family History  Problem Relation Age of Onset   Kidney failure Mother        70s   Dementia Father    Heart Problems Sister    Cancer  Maternal Grandfather    Cancer Paternal Grandmother    Cancer Paternal Grandfather    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[employer]  Tobacco Use   Smoking status: Former    Types: Cigarettes    Quit date: 06/18/1968    Years since quitting: 52.8   Smokeless tobacco: Never  Vaping Use   Vaping Use: Never used  Substance and Sexual Activity   Alcohol use: Yes    Alcohol/week: 4.0 standard drinks    Types: 4 Cans of beer per week    Comment: occasionally   Drug use: No   Sexual activity: Not on file  Other Topics Concern   Not on file  Social History Narrative   Not on file   Social Determinants of Health   Financial Resource Strain: Not on file  Food Insecurity: Not on file  Transportation Needs: Not on file  Physical Activity: Not on file  Stress: Not on file  Social Connections: Not on file    Tobacco Counseling Counseling given: Not Answered   Clinical Intake:  Pre-visit preparation completed: Yes  Pain : No/denies pain     Nutritional Risks: None Diabetes: No  How often do you need to have someone help you when you read instructions, pamphlets, or other written materials from your doctor or pharmacy?: 1 - Never  Diabetic?no  Interpreter Needed?: No  Information entered by :: Kirke Shaggy, LPN   Activities of Daily Living In your present state of health, do you have any difficulty performing the following activities: 09/05/2020  Hearing? Y  Vision? N  Difficulty concentrating or making decisions? Y  Walking or climbing stairs? N  Dressing or bathing? N  Doing errands, shopping? N  Some recent data might be hidden    Patient Care Team: Gwyneth Sprout, FNP as PCP - General (Family Medicine) [facility], [place]  [facility])  Indicate any recent Medical Services you may have received from other than [facility] providers in the past year (date may be approximate).     Assessment:   This is a routine wellness examination for [employer].  Hearing/Vision screen No results found.  Dietary issues and exercise activities discussed:     Goals Addressed   None    Depression Screen PHQ 2/9 Scores 09/05/2020 09/19/2019 02/11/2018 06/22/2017 08/09/2015 07/16/2014  PHQ - 2 Score 0 0 0 0 0 0  PHQ- 9 Score 0 - - - 1 -  Exception Documentation - - - - - Patient refusal    Fall Risk Fall  Risk  09/05/2020 09/19/2019 06/09/2019 02/11/2018 06/22/2017  Falls in the past year? 0 1 1 0 Yes  Number falls in past yr: 0 0 1 - 1  Injury with Fall? 0 1 1 - No  Comment - - went to urgent care - -  Risk for fall due to : - - - - -  Risk for fall due to: Comment - - - - -  Follow up - Falls prevention discussed Falls prevention discussed - -    FALL RISK PREVENTION PERTAINING TO THE HOME:  Any stairs in or around the home? No  If so, are there any without handrails? No  Home free of loose throw rugs in walkways, pet beds, electrical cords, etc? Yes  Adequate lighting in your home to reduce risk of falls? Yes   ASSISTIVE DEVICES UTILIZED TO PREVENT FALLS:  Life alert? No  Use of a cane, walker or w/c? No  Grab bars in the bathroom? Yes  Shower chair or bench in shower? Yes  Elevated toilet seat or a handicapped toilet? No   TIMED UP AND GO:  Was the test performed? Yes .  Length of time to ambulate 10 feet: 4 sec.   Gait steady and fast without use of assistive device  Cognitive Function:        Immunizations Immunization History  Administered Date(s) Administered   Influenza Split 12/01/2010   Influenza, High Dose Seasonal PF 11/04/2013, 11/11/2015, 11/06/2016, 10/15/2018, 10/15/2019   Influenza,inj,Quad PF,6+ Mos 10/22/2012   Influenza-Unspecified 10/20/2017   PFIZER(Purple Top)SARS-COV-2 Vaccination 03/02/2019,  03/23/2019, 01/22/2020   Pneumococcal Conjugate-13 07/13/2013   Pneumococcal Polysaccharide-23 12/01/2010   Td 08/31/2017   Zoster, Live 07/13/2013    TDAP status: Up to date  Flu Vaccine status: Up to date  Pneumococcal vaccine status: Up to date  Covid-19 vaccine status: Completed vaccines  Qualifies for Shingles Vaccine? Yes   Zostavax completed No   Shingrix Completed?: No.    Education has been provided regarding the importance of this vaccine. Patient has been advised to call insurance company to determine out of pocket expense if they have not yet received this vaccine. Advised may also receive vaccine at local pharmacy or Health Dept. Verbalized acceptance and understanding.  Screening Tests Health Maintenance  Topic Date Due   Zoster Vaccines- Shingrix (1 of 2) Never done   COVID-19 Vaccine (4 - Booster for Pfizer series) 03/18/2020   INFLUENZA VACCINE  09/09/2020   TETANUS/TDAP  09/01/2027   Pneumonia Vaccine 24+ Years old  Completed   HPV VACCINES  Aged Out    Health Maintenance  Health Maintenance Due  Topic Date Due   Zoster Vaccines- Shingrix (1 of 2) Never done   COVID-19 Vaccine (4 - Booster for Pfizer series) 03/18/2020   INFLUENZA VACCINE  09/09/2020    Colorectal cancer screening: No longer required.   Lung Cancer Screening: (Low Dose CT Chest recommended if Age 62-80 years, 30 pack-year currently smoking OR have quit w/in 15years.) does not qualify.    Additional Screening:  Hepatitis C Screening: does not qualify; Completed no  Vision Screening: Recommended annual ophthalmology exams for early detection of glaucoma and other disorders of the eye. Is the patient up to date with their annual eye exam?  Yes  Who is the provider or what is the name of the office in which the patient attends annual eye exams? [facility] If pt is not established with a provider, would they like to be referred to  a provider to establish care? No .   Dental  Screening: Recommended annual dental exams for proper oral hygiene  Community Resource Referral / Chronic Care Management: CRR required this visit?  No   CCM required this visit?  No      Plan:     I have personally reviewed and noted the following in the patients chart:   Medical and social history Use of alcohol, tobacco or illicit drugs  Current medications and supplements including opioid prescriptions. Patient is currently taking opioid prescriptions. Information provided to patient regarding non-opioid alternatives. Patient advised to discuss non-opioid treatment plan with their provider. Functional ability and status Nutritional status Physical activity Advanced directives List of other physicians Hospitalizations, surgeries, and ER visits in previous 12 months Vitals Screenings to include cognitive, depression, and falls Referrals and appointments  In addition, I have reviewed and discussed with patient certain preventive protocols, quality metrics, and best practice recommendations. A written personalized care plan for preventive services as well as general preventive health recommendations were provided to patient.     Dionisio David, LPN   [id number]   Nurse Notes: none

## 2021-04-08 NOTE — Telephone Encounter (Signed)
Requested medication (s) are due for refill today - yes  Requested medication (s) are on the active medication list -yes  Future visit scheduled -no  Last refill: 03/05/21 #50  Notes to clinic: Request RF: fails lab protocol  Requested Prescriptions  Pending Prescriptions Disp Refills   sildenafil (REVATIO) 20 MG tablet [Pharmacy Med Name: Sildenafil Citrate 20 MG Oral Tablet] 50 tablet 0    Sig: TAKE 2 TO 5 TABLETS BY MOUTH ONCE DAILY AS NEEDED FOR DIFFICULTY OBTAINING/MAINTAINING ERECTION     Urology: Erectile Dysfunction Agents Failed - 04/07/2021 11:46 AM      Failed - AST in normal range and within 360 days    AST  Date Value Ref Range Status  08/20/2015 13 0 - 40 IU/L Final          Failed - ALT in normal range and within 360 days    ALT  Date Value Ref Range Status  08/20/2015 13 0 - 44 IU/L Final          Passed - Last BP in normal range    BP Readings from Last 1 Encounters:  04/07/21 120/62          Passed - Valid encounter within last 12 months    Recent Outpatient Visits           6 months ago SOB (shortness of breath)   [facility] Tally Joe T, FNP   7 months ago GAD (generalized anxiety disorder)   [facility] [place], Dionne Bucy, MD   1 year ago Allergic conjunctivitis of both eyes   [facility] [place], [place], [place]   2 years ago Rash and nonspecific skin eruption   [facility] [place], Clearnce Sorrel, [place]   2 years ago Parotitis   [place], Clearnce Sorrel, [place]       Future Appointments             In 1 month Brendolyn Patty, MD [place]               Requested Prescriptions  Pending Prescriptions Disp Refills   sildenafil (REVATIO) 20 MG tablet [Pharmacy Med Name: Sildenafil Citrate 20 MG Oral Tablet] 50 tablet 0    Sig: TAKE 2 TO 5 TABLETS BY MOUTH ONCE DAILY AS NEEDED FOR DIFFICULTY OBTAINING/MAINTAINING ERECTION     Urology:  Erectile Dysfunction Agents Failed - 04/07/2021 11:46 AM      Failed - AST in normal range and within 360 days    AST  Date Value Ref Range Status  08/20/2015 13 0 - 40 IU/L Final          Failed - ALT in normal range and within 360 days    ALT  Date Value Ref Range Status  08/20/2015 13 0 - 44 IU/L Final          Passed - Last BP in normal range    BP Readings from Last 1 Encounters:  04/07/21 120/62          Passed - Valid encounter within last 12 months    Recent Outpatient Visits           6 months ago SOB (shortness of breath)   [facility] Gwyneth Sprout, FNP   7 months ago GAD (generalized anxiety disorder)   [facility] , Dionne Bucy, MD   1 year ago Allergic conjunctivitis of both eyes   [place],  Clearnce Sorrel, PA-C   2 years ago Rash and nonspecific skin eruption   [facility] [place], Clearnce Sorrel, [place]   2 years ago Parotitis   [place], Clearnce Sorrel, [place]       Future Appointments             In 1 month Brendolyn Patty, MD [place]

## 2021-04-25 ENCOUNTER — Other Ambulatory Visit: Payer: Self-pay | Admitting: Dermatology

## 2021-04-27 DIAGNOSIS — R509 Fever, unspecified: Secondary | ICD-10-CM | POA: Diagnosis not present

## 2021-04-27 DIAGNOSIS — U071 COVID-19: Secondary | ICD-10-CM | POA: Diagnosis not present

## 2021-05-01 DIAGNOSIS — U071 COVID-19: Secondary | ICD-10-CM | POA: Diagnosis not present

## 2021-05-01 DIAGNOSIS — L0100 Impetigo, unspecified: Secondary | ICD-10-CM | POA: Diagnosis not present

## 2021-05-03 ENCOUNTER — Other Ambulatory Visit: Payer: Self-pay | Admitting: Family Medicine

## 2021-05-03 DIAGNOSIS — N529 Male erectile dysfunction, unspecified: Secondary | ICD-10-CM

## 2021-06-02 ENCOUNTER — Ambulatory Visit: Payer: PPO | Admitting: Dermatology

## 2021-06-02 DIAGNOSIS — L2081 Atopic neurodermatitis: Secondary | ICD-10-CM

## 2021-06-02 DIAGNOSIS — Z79899 Other long term (current) drug therapy: Secondary | ICD-10-CM

## 2021-06-02 DIAGNOSIS — D692 Other nonthrombocytopenic purpura: Secondary | ICD-10-CM

## 2021-06-02 DIAGNOSIS — L853 Xerosis cutis: Secondary | ICD-10-CM

## 2021-06-02 MED ORDER — DUPILUMAB 300 MG/2ML ~~LOC~~ SOSY
600.0000 mg | PREFILLED_SYRINGE | Freq: Once | SUBCUTANEOUS | Status: AC
  Administered 2021-06-02: 600 mg via SUBCUTANEOUS

## 2021-06-02 MED ORDER — DUPIXENT 300 MG/2ML ~~LOC~~ SOSY: 300.0000 mg | PREFILLED_SYRINGE | SUBCUTANEOUS | 6 refills | Status: DC

## 2021-06-02 MED ORDER — CLOBETASOL PROPIONATE 0.05 % EX SOLN: CUTANEOUS | 2 refills | Status: DC

## 2021-06-02 NOTE — Progress Notes (Signed)
? ?Follow-Up Visit ?  ?Subjective  ?Edward Rodriguez is a 81 y.o. male who presents for the following: Dermatitis (Flanks, buttocks, severe itching clobetasol cr bid prn itch, Hydroxyzine '10mg'$  ~3-4 po qd , pt would like to restart Dupixent).  Pt has severe itching about 7 hours of every 24 hours. ? ? ?The following portions of the chart were reviewed this encounter and updated as appropriate:  ?  ?  ? ?Review of Systems:  No other skin or systemic complaints except as noted in HPI or Assessment and Plan. ? ?Objective  ?Well appearing patient in no apparent distress; mood and affect are within normal limits. ? ?A focused examination was performed including back. Relevant physical exam findings are noted in the Assessment and Plan. ? ?bil flanks, buttocks, back ?Pink scaly patches arms, back with excoriations with associated purpura ? ? ? ?Assessment & Plan  ? ?Purpura - Chronic; persistent and recurrent.  Treatable, but not curable. ?- Violaceous macules and patches ?- Benign ?- Related to trauma, age, sun damage and/or use of blood thinners, chronic use of topical and/or oral steroids ?- Observe ?- Can use OTC arnica containing moisturizer such as Dermend Bruise Formula if desired ?- Call for worsening or other concerns ? ?Xerosis ?- diffuse xerotic patches ?- recommend gentle, hydrating skin care ?- gentle skin care handout given  ?Atopic neurodermatitis ?bil flanks, buttocks, back ? ?Chronic and persistent condition with duration or expected duration over one year. Condition is bothersome/symptomatic for patient. Currently flared.  ? ?Atopic dermatitis (eczema) is a chronic, relapsing, pruritic condition that can significantly affect quality of life. It is often associated with allergic rhinitis and/or asthma and can require treatment with topical medications, phototherapy, or in severe cases biologic injectable medication (Dupixent; Adbry) or Oral JAK inhibitors.  ? ?Bx proven 02/15/2019 ?Cont Clobetasol/Cerave mix  qd/bid to aa body ?Restart Dupixent '300mg'$ /109m sq injections qowk ?Dupixent '300mg'$ /256msq injections x 2 today (loading dose) to R and L abdomen, Lot [id number], gave pt 1 Dupixent '300mg'$ /18m618mor dose in 2 weeks, Lot [id number] 02/2023 ?Cont Hydroxyzine '10mg'$  but only take in the evenings due to side effects dizziness, increased risk falls ?Cont Clobetasol cr qd to more severe itchy areas, avoid f/g/a ?[facility]ent [facility] [place] and [place] [facility] ?Dupilumab (Dupixent) is a treatment given by injection for adults and children with moderate-to-severe atopic dermatitis. Goal is control of skin condition, not cure. It is given as 2 injections at the first dose followed by 1 injection ever 2 weeks thereafter.  Young children are dosed monthly. ? ?Potential side effects include allergic reaction, herpes infections, injection site reactions and conjunctivitis (inflammation of the eyes).  The use of Dupixent requires long term medication management, including periodic office visits.  ? ?Topical steroids (such as triamcinolone, fluocinolone, fluocinonide, mometasone, clobetasol, halobetasol, betamethasone, hydrocortisone) can cause thinning and lightening of the skin if they are used for too long in the same area. Your physician has selected the right strength medicine for your problem and area affected on the body. Please use your medication only as directed by your physician to prevent side effects.   ? ?dupilumab (DUPIXENT) prefilled syringe 600 mg - bil flanks, buttocks, back ? ? ?dupilumab (DUPIXENT) 300 MG/2ML prefilled syringe - bil flanks, buttocks, back ?Inject 300 mg into the skin every 14 (fourteen) days. For maintenance. ? ?Related Medications ?hydrOXYzine (ATARAX) 10 MG tablet ?Take 1-2 tablets by mouth every evening as needed for itch. ? ?  clobetasol (TEMOVATE) 0.05 % external solution ?Patient to mix clobetasol solution in jar of CeraVe Cream. Apply to affected areas rash twice  daily until improved. Avoid face, groin, axilla. ? ? ?Return in about 1 month (around 07/02/2021) for Atopic Derm. ? ?I, Othelia Pulling, RMA, am acting as scribe for Brendolyn Patty, MD . ? ?Documentation: I have reviewed the above documentation for accuracy and completeness, and I agree with the above. ? ?Brendolyn Patty MD  ? ?

## 2021-06-02 NOTE — Patient Instructions (Addendum)
Next Dupixent injection will be on Jun 16, 2021. ? ?Eczema Skin Care ? ?Buy TWO 16oz jars of CeraVe moisturizing cream ? [facility], [facility], [facility] (no prescription needed) ? Costs about $15 per jar  ? ?Jar #1: Use as a moisturizer as needed. Can be applied to any area of the body. Use twice daily to unaffected areas. ? ?Jar #2: Pour one 20m bottle of clobetasol 0.05% solution into jar, mix well. Label this jar to indicate the medication has been added. Use twice daily to affected areas. Do not apply to face, groin or underarms. ? ?Moisturizer may burn or sting initially. Try for at least 4 weeks.   ? ?If You Need Anything After Your Visit ? ?If you have any questions or concerns for your doctor, please call our main line at [phone] press option 4 to reach your doctor's medical assistant. If no one answers, please leave a voicemail as directed and we will return your call as soon as possible. Messages left after 4 pm will be answered the following business day.  ? ?You may also send [place] message via MyChart. We typically respond to MyChart messages within 1-2 business days. ? ?For prescription refills, please ask your pharmacy to contact our office. Our fax number is [phone] ? ?If you have an urgent issue when the clinic is closed that cannot wait until the next business day, you can page your doctor at the number below.   ? ?Please note that while we do our best to be available for urgent issues outside of office hours, we are not available 24/7.  ? ?If you have an urgent issue and are unable to reach [place] you may choose to seek medical care at your doctor's office, retail clinic, urgent care center, or emergency room. ? ?If you have a medical emergency, please immediately call 911 or go to the emergency department. ? ?Pager Numbers ? ?- Dr. KNehemiah Massed [phone]? ?- Dr. MLaurence Ferrari [phone]? ?- Dr. SNicole Kindred [phone]? ?In the event of inclement weather, please call our main line at [phone]  an update on the status of any delays or closures. ? ?Dermatology Medication Tips: ?Please keep the boxes that topical medications come in in order to help keep track of the instructions about where and how to use these. Pharmacies typically print the medication instructions only on the boxes and not directly on the medication tubes.  ? ?If your medication is too expensive, please contact our office at [phone] 4 or send [place] message through [place]  ? ?We are unable to tell what your co-pay for medications will be in advance as this is different depending on your insurance coverage. However, we may be able to find a substitute medication at lower cost or fill out paperwork to get insurance to cover a needed medication.  ? ?If a prior authorization is required to get your medication covered by your insurance company, please allow u[place]1-2 business days to complete this process. ? ?Drug prices often vary depending on where the prescription is filled and some pharmacies may offer cheaper prices. ? ?The website www.goodrx.com contains coupons for medications through different pharmacies. The prices here do not account for what the cost may be with help from insurance (it may be cheaper with your insurance), but the website can give you the price if you did not use any insurance.  ?- You can print the associated coupon and take it with your prescription to the pharmacy.  ?-  You may also stop by our office during regular business hours and pick up a GoodRx coupon card.  ?- If you need your prescription sent electronically to a different pharmacy, notify our office through [facility] or by phone at [phone] option 4. ? ? ? ? ?Si Usted Necesita Algo Despu?s de Su Visita ? ?Tambi?n puede enviarnos un mensaje a trav?s de MyChart. Por lo general respondemos a los mensajes de MyChart en el transcurso de 1 a 2 d?as h?biles. ? ?Para renovar recetas, por favor pida a su farmacia que se ponga en contacto con  nuestra oficina. Nuestro n?mero de fax es el [phone]. ? ?Si tiene un asunto urgente cuando la cl?nica est? cerrada y que no puede esperar hasta el siguiente d?a h?bil, puede llamar/localizar a su doctor(a) al n?mero que aparece a continuaci?n.  ? ?Por favor, tenga en cuenta que aunque hacemos todo lo posible para estar disponibles para asuntos urgentes fuera del horario de oficina, no estamos disponibles las 24 horas del d?a, los 7 d?as de la semana.  ? ?Si tiene un problema urgente y no puede comunicarse con nosotros, puede optar por buscar atenci?n m?dica  en el consultorio de su doctor(a), en una cl?nica privada, en un centro de atenci?n urgente o en una sala de emergencias. ? ?Si tiene Engineer, maintenance (IT) m?dica, por favor llame inmediatamente al 911 o vaya a la sala de emergencias. ? ?N?meros de b?per ? ?- Dr. Nehemiah Massed: [phone] ? ?- Dra. Moye: [phone] ? ?- Dra. Nicole Kindred: [phone] ? ?En caso de inclemencias del tiempo, por favor llame a nuestra l?nea principal al [phone] para una actualizaci?n sobre el estado de cualquier retraso o cierre. ? ?Consejos para la medicaci?n en dermatolog?a: ?Por favor, guarde las cajas en las que vienen los medicamentos de uso t?pico para ayudarle a seguir las instrucciones sobre d?nde y c?mo usarlos. Las farmacias generalmente imprimen las instrucciones del medicamento s?lo en las cajas y no directamente en los tubos del [place].  ? ?Si su medicamento es muy caro, por favor, p?ngase en contacto con Zigmund Daniel llamando al [phone] y presione la opci?n 4 o env?enos un mensaje a trav?s de MyChart.  ? ?No podemos decirle cu?l ser? su copago por los medicamentos por adelantado ya que esto es diferente dependiendo de la cobertura de su seguro. Sin embargo, es posible que podamos encontrar un medicamento sustituto a Electrical engineer un formulario para que el seguro cubra el medicamento que se considera necesario.  ? ?Si se requiere Ardelia Mems autorizaci?n  previa para que su compa??a de seguros [place] su medicamento, por favor perm?tanos de 1 a 2 d?as h?biles para completar este proceso. ? ?Los precios de los medicamentos var?an con frecuencia dependiendo del Environmental consultant de d?nde se surte la receta y alguna farmacias pueden ofrecer precios m?s baratos. ? ?El sitio web www.goodrx.com tiene cupones para medicamentos de Airline pilot. Los precios aqu? no tienen en cuenta lo que podr?a costar con la ayuda del seguro (puede ser m?s barato con su seguro), pero el sitio web puede darle el precio si no utiliz? ning?n seguro.  ?- Puede imprimir el cup?n correspondiente y llevarlo con su receta a la farmacia.  ?- Tambi?n puede pasar por nuestra oficina durante el horario de atenci?n regular y recoger una tarjeta de cupones de GoodRx.  ?- Si necesita que su receta se env?e electr?nicamente a Chiropodist, informe a nuestra oficina a trav?s de MyChart de [place] o por tel?fono llamando al [phone] y  presione la opci?n 4.  ?

## 2021-06-03 DIAGNOSIS — M180 Bilateral primary osteoarthritis of first carpometacarpal joints: Secondary | ICD-10-CM | POA: Diagnosis not present

## 2021-06-08 ENCOUNTER — Other Ambulatory Visit: Payer: Self-pay | Admitting: Family Medicine

## 2021-06-08 DIAGNOSIS — N529 Male erectile dysfunction, unspecified: Secondary | ICD-10-CM

## 2021-06-08 DIAGNOSIS — F419 Anxiety disorder, unspecified: Secondary | ICD-10-CM

## 2021-06-30 ENCOUNTER — Ambulatory Visit: Payer: PPO | Admitting: Dermatology

## 2021-06-30 DIAGNOSIS — D692 Other nonthrombocytopenic purpura: Secondary | ICD-10-CM

## 2021-06-30 DIAGNOSIS — L739 Follicular disorder, unspecified: Secondary | ICD-10-CM | POA: Diagnosis not present

## 2021-06-30 DIAGNOSIS — L2081 Atopic neurodermatitis: Secondary | ICD-10-CM | POA: Diagnosis not present

## 2021-06-30 DIAGNOSIS — Z79899 Other long term (current) drug therapy: Secondary | ICD-10-CM | POA: Diagnosis not present

## 2021-06-30 MED ORDER — CLINDAMYCIN PHOSPHATE 1 % EX SOLN: Freq: Every day | CUTANEOUS | 6 refills | Status: DC

## 2021-06-30 NOTE — Patient Instructions (Addendum)
Start Allegra '180mg'$  1 pill a day in the morning    If You Need Anything After Your Visit  If you have any questions or concerns for your doctor, please call our main line at [phone] and press option 4 to reach your doctor's medical assistant. If no one answers, please leave a voicemail as directed and we will return your call as soon as possible. Messages left after 4 pm will be answered the following business day.   You may also send [place] a message via [place]. We typically respond to MyChart messages within 1-2 business days.  For prescription refills, please ask your pharmacy to contact our office. Our fax number is [phone].  If you have an urgent issue when the clinic is closed that cannot wait until the next business day, you can page your doctor at the number below.    Please note that while we do our best to be available for urgent issues outside of office hours, we are not available 24/7.   If you have an urgent issue and are unable to reach [place], you may choose to seek medical care at your doctor's office, retail clinic, urgent care center, or emergency room.  If you have a medical emergency, please immediately call 911 or go to the emergency department.  Pager Numbers  - Dr. Nehemiah Massed: [phone]  - Dr. Laurence Ferrari: [phone]  - Dr. Nicole Kindred: [phone]  In the event of inclement weather, please call our main line at [phone] for an update on the status of any delays or closures.  Dermatology Medication Tips: Please keep the boxes that topical medications come in in order to help keep track of the instructions about where and how to use these. Pharmacies typically print the medication instructions only on the boxes and not directly on the medication tubes.   If your medication is too expensive, please contact our office at [phone] option 4 or send [place] a message through [place].   We are unable to tell what your co-pay for medications will be in advance as this  is different depending on your insurance coverage. However, we may be able to find a substitute medication at lower cost or fill out paperwork to get insurance to cover a needed medication.   If a prior authorization is required to get your medication covered by your insurance company, please allow [place] 1-2 business days to complete this process.  Drug prices often vary depending on where the prescription is filled and some pharmacies may offer cheaper prices.  The website www.goodrx.com contains coupons for medications through different pharmacies. The prices here do not account for what the cost may be with help from insurance (it may be cheaper with your insurance), but the website can give you the price if you did not use any insurance.  - You can print the associated coupon and take it with your prescription to the pharmacy.  - You may also stop by our office during regular business hours and pick up a GoodRx coupon card.  - If you need your prescription sent electronically to a different pharmacy, notify our office through [facility] or by phone at [phone] option 4.     Si Usted Necesita Algo Despus de Su Visita  Tambin puede enviarnos un mensaje a travs de Pharmacist, community. Por lo general respondemos a los mensajes de MyChart en el transcurso de 1 a 2 das hbiles.  Para renovar recetas, por favor pida a su farmacia que se ponga en contacto  con nuestra oficina. Harland Dingwall de fax es [place] [phone].  Si tiene un asunto urgente cuando la clnica est cerrada y que no puede esperar hasta el siguiente da hbil, puede llamar/localizar a su doctor(a) al nmero que aparece a continuacin.   Por favor, tenga en cuenta que aunque hacemos todo lo posible para estar disponibles para asuntos urgentes fuera del horario de [place], no estamos disponibles las 24 horas del da, los 7 das de la [place].   Si tiene un problema urgente y no puede comunicarse con nosotros, puede optar por  buscar atencin mdica  en el consultorio de su doctor(a), en una clnica privada, en un centro de atencin urgente o en una sala de emergencias.  Si tiene Engineering geologist, por favor llame inmediatamente al 911 o vaya a la sala de emergencias.  Nmeros de bper  - Dr. Nehemiah Massed: [phone]  - Dra. Moye: [phone]  - Dra. Nicole Kindred: [phone]  En caso de inclemencias del [place], por favor llame a Johnsie Kindred principal al [phone] para una actualizacin sobre el [place] de cualquier retraso o cierre.  Consejos para la medicacin en dermatologa: Por favor, guarde las cajas en las que vienen los medicamentos de uso tpico para ayudarle a seguir las instrucciones sobre dnde y cmo usarlos. Las farmacias generalmente imprimen las instrucciones del medicamento slo en las cajas y no directamente en los tubos del [place].   Si su medicamento es muy caro, por favor, pngase en contacto con Zigmund Daniel llamando al [phone] y presione la opcin 4 o envenos un mensaje a travs de Pharmacist, community.   No podemos decirle cul ser su copago por los medicamentos por adelantado ya que esto es diferente dependiendo de la cobertura de su seguro. Sin embargo, es posible que podamos encontrar un medicamento sustituto a Electrical engineer un formulario para que el seguro cubra el medicamento que se considera necesario.   Si se requiere una autorizacin previa para que su compaa de seguros [place] su medicamento, por favor permtanos de 1 a 2 das hbiles para completar este proceso.  Los precios de los medicamentos varan con frecuencia dependiendo del Environmental consultant de dnde se surte la receta y alguna farmacias pueden ofrecer precios ms baratos.  El sitio web www.goodrx.com tiene cupones para medicamentos de Airline pilot. Los precios aqu no tienen en cuenta lo que podra costar con la ayuda del seguro (puede ser ms barato con su seguro), pero el sitio web puede darle el precio si no  utiliz Research scientist (physical sciences).  - Puede imprimir el cupn correspondiente y llevarlo con su receta a la farmacia.  - Tambin puede pasar por nuestra oficina durante el horario de atencin regular y Charity fundraiser una tarjeta de cupones de GoodRx.  - Si necesita que su receta se enve electrnicamente a una farmacia diferente, informe a nuestra oficina a travs de MyChart de [place] o por telfono llamando al [phone] y presione la opcin 4.

## 2021-06-30 NOTE — Progress Notes (Addendum)
Follow-Up Visit   Subjective  Edward Rodriguez is a 81 y.o. male who presents for the following: Eczema (Trunk, legs, arms, 18mf/u, improving since restarting Dupixent, Dupixent sq injections q 2 wks, Clobetasol/cerave mix qd, Hydroxyzine '10mg'$  prn) and folliculitis (Scalp, Clindamycin sol 3-4x/wk).  Dupixent is covered, but copay is too high.   The following portions of the chart were reviewed this encounter and updated as appropriate:       Review of Systems:  No other skin or systemic complaints except as noted in HPI or Assessment and Plan.  Objective  Well appearing patient in no apparent distress; mood and affect are within normal limits.  A focused examination was performed including trunk, scalp. Relevant physical exam findings are noted in the Assessment and Plan.  trunk Hyperpigmented macules, few pink excoriated paps L and R flanks  Scalp Perifollicular erythematous papules    Assessment & Plan   Purpura - Chronic; persistent and recurrent.  Treatable, but not curable. - Violaceous macules and patches - Benign - Related to trauma, age, sun damage and/or use of blood thinners, chronic use of topical and/or oral steroids - Observe - Can use OTC arnica containing moisturizer such as Dermend Bruise Formula if desired - Call for worsening or other concerns  Atopic neurodermatitis trunk  Chronic and persistent condition with duration or expected duration over one year. Condition is symptomatic/ bothersome to patient. Improving on Dupixent, not currently at goal.   Atopic dermatitis - Severe, on Dupixent (biologic medication).  Atopic dermatitis (eczema) is a chronic, relapsing, pruritic condition that can significantly affect quality of life. It is often associated with allergic rhinitis and/or asthma and can require treatment with topical medications, phototherapy, or in severe cases a biologic medication called Dupixent, which requires long term medication management.     Cont Dupixent '300mg'$ /25msq injections qowk, samples x 2 Lot [id number], pt self-injects at thome Cont Clobetasol/Cerave mix qd prn flares, avoid f/g/a Cont Hydroxyzine '10mg'$  1 po hs prn flares Recommend starting Allegra '180mg'$  1 po qam  Discussed with pt to call [address] to get copay assistance for Dupixent.  Dupilumab (Dupixent) is a treatment given by injection for adults and children with moderate-to-severe atopic dermatitis. Goal is control of skin condition, not cure. It is given as 2 injections at the first dose followed by 1 injection ever 2 weeks thereafter.  Young children are dosed monthly.  Potential side effects include allergic reaction, herpes infections, injection site reactions and conjunctivitis (inflammation of the eyes).  The use of Dupixent requires long term medication management, including periodic office visits.   Topical steroids (such as triamcinolone, fluocinolone, fluocinonide, mometasone, clobetasol, halobetasol, betamethasone, hydrocortisone) can cause thinning and lightening of the skin if they are used for too long in the same area. Your physician has selected the right strength medicine for your problem and area affected on the body. Please use your medication only as directed by your physician to prevent side effects.    Related Medications hydrOXYzine (ATARAX) 10 MG tablet Take 1-2 tablets by mouth every evening as needed for itch.  clobetasol (TEMOVATE) 0.05 % external solution Patient to mix clobetasol solution in jar of CeraVe Cream. Apply to affected areas rash twice daily until improved. Avoid face, groin, axilla.  dupilumab (DUPIXENT) 300 MG/2ML prefilled syringe Inject 300 mg into the skin every 14 (fourteen) days. For maintenance.  Folliculitis Scalp  Chronic and persistent condition with duration or expected duration over one year. Condition is symptomatic /  bothersome to patient. Improved but Not to goal.   Cont Clindamycin sol qd  prn flares  clindamycin (CLEOCIN T) 1 % external solution - Scalp Apply topically daily. Qd to aa scalp prn flares   Return in about 1 month (around 07/31/2021) for Atopic Derm.  I, Othelia Pulling, RMA, am acting as scribe for Brendolyn Patty, MD .  Documentation: I have reviewed the above documentation for accuracy and completeness, and I agree with the above.  Brendolyn Patty MD

## 2021-07-08 NOTE — Progress Notes (Deleted)
      Established patient visit   Patient: Edward Rodriguez   DOB: [birth year]   81 y.o. Male  MRN: [id number] Visit Date: 07/09/2021  Today's healthcare provider: Gwyneth Sprout, FNP   No chief complaint on file.  Subjective    HPI  ***  Medications: Outpatient Medications Prior to Visit  Medication Sig   albuterol (VENTOLIN HFA) 108 (90 Base) MCG/ACT inhaler Inhale 2 puffs into the lungs every 6 (six) hours as needed for wheezing or shortness of breath.   clindamycin (CLEOCIN T) 1 % external solution Apply topically daily. Qd to aa scalp prn flares   clobetasol (TEMOVATE) 0.05 % external solution Patient to mix clobetasol solution in jar of CeraVe Cream. Apply to affected areas rash twice daily until improved. Avoid face, groin, axilla.   dupilumab (DUPIXENT) 300 MG/2ML prefilled syringe Inject 300 mg into the skin every 14 (fourteen) days. For maintenance.   FLUZONE HIGH-DOSE QUADRIVALENT 0.7 ML SUSY    hydrOXYzine (ATARAX) 10 MG tablet Take 1-2 tablets by mouth every evening as needed for itch.   ketoconazole (NIZORAL) 2 % shampoo ketoconazole 2 % shampoo  APPLY TOPICALLY ONCE FOR ONE DOSE. APPLY FIVE TIMES PER WEEK. MASSAGE INTO SCALP AND LEAVE IN FOR FIVE MINUTES BEFORE RINSING OUT   meloxicam (MOBIC) 15 MG tablet Take 15 mg by mouth daily.    omeprazole (PRILOSEC) 20 MG capsule Take 40 mg by mouth every other day.   PFIZER COVID-19 VAC BIVALENT injection    pramoxine-hydrocortisone (PROCTOCREAM-HC) 1-1 % rectal cream Place rectally 3 (three) times daily. As needed for hemorrhoids   sildenafil (REVATIO) 20 MG tablet TAKE 2 TO 5 TABLETS BY MOUTH ONCE DAILY AS NEEDED FOR  DIFFICLUTY  OBTAINING  /  MAINTAINING  ERECTION   SSD 1 % cream APPLY TOPICALLY DAILY   triamcinolone (KENALOG) 0.1 % APPLY  CREAM EXTERNALLY TO AFFECTED AREA TWICE DAILY RASH  OR  ITCHING AS NEEDED.  AVOID  FACE,  GROIN  AND  AXILLA.   venlafaxine XR (EFFEXOR-XR) 150 MG 24 hr capsule TAKE 1 CAPSULE BY MOUTH ONCE DAILY  WITH BREAKFAST   No facility-administered medications prior to visit.    Review of Systems  {Labs  Heme  Chem  Endocrine  Serology  Results Review (optional):23779}   Objective    There were no vitals taken for this visit. {Show previous vital signs (optional):23777}  Physical Exam  ***  No results found for any visits on 07/09/21.  Assessment & Plan     ***  No follow-ups on file.      {provider attestation***:1}   Gwyneth Sprout, [place] [phone] (phone) [phone] (fax)  [place]

## 2021-07-09 ENCOUNTER — Ambulatory Visit: Payer: PPO | Admitting: Family Medicine

## 2021-07-10 ENCOUNTER — Encounter: Payer: Self-pay | Admitting: Family Medicine

## 2021-07-10 ENCOUNTER — Ambulatory Visit (INDEPENDENT_AMBULATORY_CARE_PROVIDER_SITE_OTHER): Payer: PPO | Admitting: Family Medicine

## 2021-07-10 VITALS — BP 115/76 | HR 77 | Resp 16 | Wt 198.0 lb

## 2021-07-10 DIAGNOSIS — I70219 Atherosclerosis of native arteries of extremities with intermittent claudication, unspecified extremity: Secondary | ICD-10-CM | POA: Diagnosis not present

## 2021-07-10 DIAGNOSIS — R5383 Other fatigue: Secondary | ICD-10-CM | POA: Diagnosis not present

## 2021-07-10 DIAGNOSIS — W19XXXS Unspecified fall, sequela: Secondary | ICD-10-CM | POA: Diagnosis not present

## 2021-07-10 DIAGNOSIS — D692 Other nonthrombocytopenic purpura: Secondary | ICD-10-CM | POA: Diagnosis not present

## 2021-07-10 DIAGNOSIS — W19XXXA Unspecified fall, initial encounter: Secondary | ICD-10-CM | POA: Insufficient documentation

## 2021-07-10 DIAGNOSIS — F419 Anxiety disorder, unspecified: Secondary | ICD-10-CM

## 2021-07-10 DIAGNOSIS — K219 Gastro-esophageal reflux disease without esophagitis: Secondary | ICD-10-CM

## 2021-07-10 DIAGNOSIS — Z0000 Encounter for general adult medical examination without abnormal findings: Secondary | ICD-10-CM

## 2021-07-10 DIAGNOSIS — F32A Depression, unspecified: Secondary | ICD-10-CM

## 2021-07-10 DIAGNOSIS — E7800 Pure hypercholesterolemia, unspecified: Secondary | ICD-10-CM

## 2021-07-10 NOTE — Assessment & Plan Note (Signed)
Acute on chronic, hx of SOB, hx of DOE Now complaints of fatigue and ability to nap/fall asleep for 10-60 minutes despite full night sleep, 7 hours Denies "depression" however, has been feeling "down" Has not been playing as much golf Many friends have been sick or too ill Wishes to have labs checked

## 2021-07-10 NOTE — Progress Notes (Deleted)
Established patient visit  I,Tate Zagal,acting as a scribe for Gwyneth Sprout, FNP.,have documented all relevant documentation on the behalf of Gwyneth Sprout, FNP,as directed by  Gwyneth Sprout, FNP while in the presence of Gwyneth Sprout, FNP.   Patient: Edward Rodriguez   DOB: 05-10-1940   81 y.o. Male  MRN: [id number] Visit Date: 07/10/2021  Today's healthcare provider: Gwyneth Sprout, FNP   Chief Complaint  Patient presents with   Sleeping Problem   Fall   Subjective    HPI  Patient is here to discuss sleeping issues and fatigue. Patient states he Is sleeping to much and is always sleepy. Patient sleeps up to 6 1/2 to 7 hours a night.   Patient also had 2 falls over 1 month ago. Patient tripped over a rock while hiking through the woods. Patient fell on his hands and knees. Patient states has scabs on his arms that are very tender.  Patient also wants to discuss changing venlafaxine to a different medication.  Medications: Outpatient Medications Prior to Visit  Medication Sig   albuterol (VENTOLIN HFA) 108 (90 Base) MCG/ACT inhaler Inhale 2 puffs into the lungs every 6 (six) hours as needed for wheezing or shortness of breath.   clindamycin (CLEOCIN T) 1 % external solution Apply topically daily. Qd to aa scalp prn flares   clobetasol (TEMOVATE) 0.05 % external solution Patient to mix clobetasol solution in jar of CeraVe Cream. Apply to affected areas rash twice daily until improved. Avoid face, groin, axilla.   dupilumab (DUPIXENT) 300 MG/2ML prefilled syringe Inject 300 mg into the skin every 14 (fourteen) days. For maintenance.   FLUZONE HIGH-DOSE QUADRIVALENT 0.7 ML SUSY    hydrOXYzine (ATARAX) 10 MG tablet Take 1-2 tablets by mouth every evening as needed for itch.   ketoconazole (NIZORAL) 2 % shampoo ketoconazole 2 % shampoo  APPLY TOPICALLY ONCE FOR ONE DOSE. APPLY FIVE TIMES PER WEEK. MASSAGE INTO SCALP AND LEAVE IN FOR FIVE MINUTES BEFORE RINSING OUT   meloxicam (MOBIC)  15 MG tablet Take 15 mg by mouth daily.    omeprazole (PRILOSEC) 20 MG capsule Take 40 mg by mouth every other day.   PFIZER COVID-19 VAC BIVALENT injection    pramoxine-hydrocortisone (PROCTOCREAM-HC) 1-1 % rectal cream Place rectally 3 (three) times daily. As needed for hemorrhoids   sildenafil (REVATIO) 20 MG tablet TAKE 2 TO 5 TABLETS BY MOUTH ONCE DAILY AS NEEDED FOR  DIFFICLUTY  OBTAINING  /  MAINTAINING  ERECTION   SSD 1 % cream APPLY TOPICALLY DAILY   triamcinolone (KENALOG) 0.1 % APPLY  CREAM EXTERNALLY TO AFFECTED AREA TWICE DAILY RASH  OR  ITCHING AS NEEDED.  AVOID  FACE,  GROIN  AND  AXILLA.   venlafaxine XR (EFFEXOR-XR) 150 MG 24 hr capsule TAKE 1 CAPSULE BY MOUTH ONCE DAILY WITH BREAKFAST   No facility-administered medications prior to visit.    Review of Systems  {Labs  Heme  Chem  Endocrine  Serology  Results Review (optional):23779}   Objective    BP 115/76 (BP Location: Right Arm, Patient Position: Sitting, Cuff Size: Normal)   Pulse 77   Resp 16   Wt 198 lb (89.8 kg)   SpO2 95%   BMI 27.62 kg/m  {Show previous vital signs (optional):23777}  Physical Exam  ***  No results found for any visits on 07/10/21.  Assessment & Plan     ***  No follow-ups on file.      {  provider attestation***:1}   Gwyneth Sprout, [place] [phone] (phone) [phone] (fax)  [place]

## 2021-07-10 NOTE — Assessment & Plan Note (Signed)
Chronic, stable Slight discoloration in BLE Pulses intact Is active Body mass index is 27.62 kg/m. BP at goal Will check CMP, CBC and lipids

## 2021-07-10 NOTE — Progress Notes (Addendum)
Established patient visit- Annual Physical   Patient: Edward Rodriguez   DOB: 05/30/1940   81 y.o. Male  MRN: [id number] Visit Date: 07/10/2021  Today's healthcare provider: Gwyneth Sprout, FNP  Re Introduced to nurse practitioner role and practice setting.  All questions answered.  Discussed provider/patient relationship and expectations.   Chief Complaint  Patient presents with   Sleeping Problem   Fall   Annual Exam   Subjective    HPI    Medications: Outpatient Medications Prior to Visit  Medication Sig   albuterol (VENTOLIN HFA) 108 (90 Base) MCG/ACT inhaler Inhale 2 puffs into the lungs every 6 (six) hours as needed for wheezing or shortness of breath.   clindamycin (CLEOCIN T) 1 % external solution Apply topically daily. Qd to aa scalp prn flares   clobetasol (TEMOVATE) 0.05 % external solution Patient to mix clobetasol solution in jar of CeraVe Cream. Apply to affected areas rash twice daily until improved. Avoid face, groin, axilla.   dupilumab (DUPIXENT) 300 MG/2ML prefilled syringe Inject 300 mg into the skin every 14 (fourteen) days. For maintenance.   FLUZONE HIGH-DOSE QUADRIVALENT 0.7 ML SUSY    hydrOXYzine (ATARAX) 10 MG tablet Take 1-2 tablets by mouth every evening as needed for itch.   ketoconazole (NIZORAL) 2 % shampoo ketoconazole 2 % shampoo  APPLY TOPICALLY ONCE FOR ONE DOSE. APPLY FIVE TIMES PER WEEK. MASSAGE INTO SCALP AND LEAVE IN FOR FIVE MINUTES BEFORE RINSING OUT   meloxicam (MOBIC) 15 MG tablet Take 15 mg by mouth daily.    omeprazole (PRILOSEC) 20 MG capsule Take 40 mg by mouth every other day.   PFIZER COVID-19 VAC BIVALENT injection    pramoxine-hydrocortisone (PROCTOCREAM-HC) 1-1 % rectal cream Place rectally 3 (three) times daily. As needed for hemorrhoids   sildenafil (REVATIO) 20 MG tablet TAKE 2 TO 5 TABLETS BY MOUTH ONCE DAILY AS NEEDED FOR  DIFFICLUTY  OBTAINING  /  MAINTAINING  ERECTION   SSD 1 % cream APPLY TOPICALLY DAILY   triamcinolone  (KENALOG) 0.1 % APPLY  CREAM EXTERNALLY TO AFFECTED AREA TWICE DAILY RASH  OR  ITCHING AS NEEDED.  AVOID  FACE,  GROIN  AND  AXILLA.   venlafaxine XR (EFFEXOR-XR) 150 MG 24 hr capsule TAKE 1 CAPSULE BY MOUTH ONCE DAILY WITH BREAKFAST   No facility-administered medications prior to visit.    Review of Systems     Objective    BP 115/76 (BP Location: Right Arm, Patient Position: Sitting, Cuff Size: Normal)   Pulse 77   Resp 16   Wt 198 lb (89.8 kg)   SpO2 95%   BMI 27.62 kg/m    Physical Exam Vitals and nursing note reviewed.  Constitutional:      General: He is awake. He is not in acute distress.    Appearance: Normal appearance. He is well-developed, well-groomed and overweight. He is not ill-appearing, toxic-appearing or diaphoretic.  HENT:     Head: Normocephalic and atraumatic.     Jaw: There is normal jaw occlusion. No trismus, tenderness, swelling or pain on movement.     Salivary Glands: Right salivary gland is not diffusely enlarged or tender. Left salivary gland is not diffusely enlarged or tender.     Right Ear: Tympanic membrane, ear canal and external ear normal. Decreased hearing noted. There is no impacted cerumen.     Left Ear: Tympanic membrane, ear canal and external ear normal. Decreased hearing noted. There is no impacted cerumen.  Nose: Nose normal. No congestion or rhinorrhea.     Right Turbinates: Not enlarged, swollen or pale.     Left Turbinates: Not enlarged, swollen or pale.     Right Sinus: No maxillary sinus tenderness or frontal sinus tenderness.     Left Sinus: No maxillary sinus tenderness or frontal sinus tenderness.     Mouth/Throat:     Lips: Pink.     Mouth: Mucous membranes are moist. No injury, lacerations, oral lesions or angioedema.     Pharynx: Oropharynx is clear. Uvula midline. No pharyngeal swelling, oropharyngeal exudate or posterior oropharyngeal erythema.     Tonsils: No tonsillar exudate or tonsillar abscesses.  Eyes:      General: Lids are normal. Vision grossly intact. Gaze aligned appropriately.        Right eye: No discharge.        Left eye: No discharge.     Extraocular Movements: Extraocular movements intact.     Conjunctiva/sclera: Conjunctivae normal.     Pupils: Pupils are equal, round, and reactive to light.     Comments: Wears eyeglasses  Neck:     Thyroid: No thyroid mass, thyromegaly or thyroid tenderness.     Vascular: No carotid bruit.     Trachea: Trachea normal. No tracheal tenderness.  Cardiovascular:     Rate and Rhythm: Normal rate and regular rhythm.     Pulses: Normal pulses.          Carotid pulses are 2+ on the right side and 2+ on the left side.      Radial pulses are 2+ on the right side and 2+ on the left side.       Femoral pulses are 2+ on the right side and 2+ on the left side.      Popliteal pulses are 2+ on the right side and 2+ on the left side.       Dorsalis pedis pulses are 2+ on the right side and 2+ on the left side.       Posterior tibial pulses are 2+ on the right side and 2+ on the left side.     Heart sounds: Normal heart sounds, S1 normal and S2 normal. No murmur heard.   No friction rub. No gallop.  Pulmonary:     Effort: Pulmonary effort is normal. No respiratory distress.     Breath sounds: Normal breath sounds and air entry. No stridor. No wheezing, rhonchi or rales.  Chest:     Chest wall: No tenderness.  Abdominal:     General: Abdomen is flat. Bowel sounds are normal. There is no distension.     Palpations: Abdomen is soft. There is no mass.     Tenderness: There is no abdominal tenderness. There is no guarding or rebound.     Hernia: No hernia is present.  Genitourinary:    Comments: Exam deferred; denies complaints Musculoskeletal:        General: No swelling, tenderness, deformity or signs of injury. Normal range of motion.     Cervical back: Normal range of motion and neck supple. No rigidity or tenderness.     Right lower leg: No edema.      Left lower leg: No edema.  Lymphadenopathy:     Cervical: No cervical adenopathy.     Right cervical: No superficial, deep or posterior cervical adenopathy.    Left cervical: No superficial, deep or posterior cervical adenopathy.  Skin:    General: Skin is warm and dry.  Capillary Refill: Capillary refill takes less than 2 seconds.     Coloration: Skin is not jaundiced or pale.     Findings: No bruising, erythema, lesion or rash.  Neurological:     General: No focal deficit present.     Mental Status: He is alert and oriented to person, place, and time. Mental status is at baseline.     GCS: GCS eye subscore is 4. GCS verbal subscore is 5. GCS motor subscore is 6.     Sensory: Sensation is intact. No sensory deficit.     Motor: Motor function is intact. No weakness.     Coordination: Coordination is intact.     Gait: Gait is intact.  Psychiatric:        Attention and Perception: Attention and perception normal.        Mood and Affect: Mood and affect normal.        Speech: Speech normal.        Behavior: Behavior normal. Behavior is cooperative.        Thought Content: Thought content normal.        Cognition and Memory: Cognition normal.        Judgment: Judgment normal.      No results found for any visits on 07/10/21.  Assessment & Plan     Problem List Items Addressed This Visit       Cardiovascular and Mediastinum   Atherosclerotic PVD with intermittent claudication (HCC)    Chronic, stable Slight discoloration in BLE Pulses intact Is active Body mass index is 27.62 kg/m. BP at goal Will check CMP, CBC and lipids        Senile purpura (HCC)    Chronic, stable Recent injury to bilateral hands and arms sustained during fall during hiking Now healed with use of xeroform          Digestive   Gastroesophageal reflux disease    Chronic, stable Continue prilosec at 40 mg every other day, 20 mg capsule         Other   Annual physical exam - Primary     Due for dental Due for vision Plans to have hearing checked Has been eating more ice cream Is in process of selling home [employer], significant other, still works Will go meet her new grandchild in [place] later this summer Is working some too Things to do to keep yourself healthy  - Exercise at least 30-45 minutes a day, 3-4 days a week.  - Eat a low-fat diet with lots of fruits and vegetables, up to 7-9 servings per day.  - Seatbelts can save your life. Wear them always.  - Smoke detectors on every level of your home, check batteries every year.  - Eye Doctor - have an eye exam every 1-2 years  - Safe sex - if you may be exposed to STDs, use a condom.  - Alcohol -  If you drink, do it moderately, less than 2 drinks per day.  - [place]. Choose someone to speak for you if you are not able.  - Depression is common in our stressful world.If you're feeling down or losing interest in things you normally enjoy, please come in for a visit.  - Violence - If anyone is threatening or hurting you, please call immediately.         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- has been stable and controlled on 150 mg       Elevated LDL cholesterol level    Chronic, stable Repeat lipid panel NF- patient had cereal this morning       Fall    Falls x2 while hiking Denies major injuries Self treated abrasions to hands and arms Treated with topical agents and xeroform Encouraged vit e based moisturizer and caution        Fatigue    Acute on chronic, hx of SOB, hx of DOE Now complaints of  fatigue and ability to nap/fall asleep for 10-60 minutes despite full night sleep, 7 hours Denies "depression" however, has been feeling "down" Has not been playing as much golf Many friends have been sick or too ill Wishes to have labs checked          Return in about 4 weeks (around 08/07/2021) for chonic disease management.      Vonna Kotyk, FNP, have reviewed all documentation for this visit. The documentation on 07/10/21 for the exam, diagnosis, procedures, and orders are all accurate and complete.    Gwyneth Sprout, [place] [phone] (phone) [phone] (fax)  [place]

## 2021-07-10 NOTE — Assessment & Plan Note (Signed)
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

## 2021-07-10 NOTE — Assessment & Plan Note (Signed)
Chronic, stable Repeat lipid panel NF- patient had cereal this morning

## 2021-07-10 NOTE — Assessment & Plan Note (Signed)
Falls x2 while hiking Denies major injuries Self treated abrasions to hands and arms Treated with topical agents and xeroform Encouraged vit e based moisturizer and caution

## 2021-07-10 NOTE — Assessment & Plan Note (Signed)
Due for dental Due for vision Plans to have hearing checked Has been eating more ice cream Is in process of selling home [employer], significant other, still works Will go meet her new grandchild in [place] later this summer Is working some too Things to do to keep yourself healthy  - Exercise at least 30-45 minutes a day, 3-4 days a week.  - Eat a low-fat diet with lots of fruits and vegetables, up to 7-9 servings per day.  - Seatbelts can save your life. Wear them always.  - Smoke detectors on every level of your home, check batteries every year.  - Eye Doctor - have an eye exam every 1-2 years  - Safe sex - if you may be exposed to STDs, use a condom.  - Alcohol -  If you drink, do it moderately, less than 2 drinks per day.  - [place]. Choose someone to speak for you if you are not able.  - Depression is common in our stressful world.If you're feeling down or losing interest in things you normally enjoy, please come in for a visit.  - Violence - If anyone is threatening or hurting you, please call immediately.

## 2021-07-10 NOTE — Assessment & Plan Note (Signed)
Chronic, stable Continue prilosec at 40 mg every other day, 20 mg capsule

## 2021-07-10 NOTE — Assessment & Plan Note (Signed)
Chronic, stable Recent injury to bilateral hands and arms sustained during fall during hiking Now healed with use of xeroform

## 2021-07-11 ENCOUNTER — Other Ambulatory Visit: Payer: Self-pay | Admitting: Family Medicine

## 2021-07-11 DIAGNOSIS — N1831 Chronic kidney disease, stage 3a: Secondary | ICD-10-CM | POA: Insufficient documentation

## 2021-07-11 DIAGNOSIS — F419 Anxiety disorder, unspecified: Secondary | ICD-10-CM

## 2021-07-11 DIAGNOSIS — R7989 Other specified abnormal findings of blood chemistry: Secondary | ICD-10-CM | POA: Insufficient documentation

## 2021-07-11 LAB — COMPREHENSIVE METABOLIC PANEL
ALT: 15 IU/L (ref 0–44)
AST: 18 IU/L (ref 0–40)
Albumin/Globulin Ratio: 1.9 (ref 1.2–2.2)
Albumin: 4.3 g/dL (ref 3.7–4.7)
Alkaline Phosphatase: 83 IU/L (ref 44–121)
BUN/Creatinine Ratio: 13 (ref 10–24)
BUN: 18 mg/dL (ref 8–27)
Bilirubin Total: 0.2 mg/dL (ref 0.0–1.2)
CO2: 22 mmol/L (ref 20–29)
Calcium: 9.5 mg/dL (ref 8.6–10.2)
Chloride: 102 mmol/L (ref 96–106)
Creatinine, Ser: 1.42 mg/dL — ABNORMAL HIGH (ref 0.76–1.27)
Globulin, Total: 2.3 g/dL (ref 1.5–4.5)
Glucose: 110 mg/dL — ABNORMAL HIGH (ref 70–99)
Potassium: 4.5 mmol/L (ref 3.5–5.2)
Sodium: 139 mmol/L (ref 134–144)
Total Protein: 6.6 g/dL (ref 6.0–8.5)
eGFR: 50 mL/min/{1.73_m2} — ABNORMAL LOW (ref >59)

## 2021-07-11 LAB — CBC WITH DIFFERENTIAL/PLATELET
Basophils Absolute: 0.1 10*3/uL (ref 0.0–0.2)
Basos: 1 %
EOS (ABSOLUTE): 0.5 10*3/uL — ABNORMAL HIGH (ref 0.0–0.4)
Eos: 7 %
Hematocrit: 41 % (ref 37.5–51.0)
Hemoglobin: 14.5 g/dL (ref 13.0–17.7)
Immature Grans (Abs): 0 10*3/uL (ref 0.0–0.1)
Immature Granulocytes: 0 %
Lymphocytes Absolute: 2.3 10*3/uL (ref 0.7–3.1)
Lymphs: 37 %
MCH: 32.7 pg (ref 26.6–33.0)
MCHC: 35.4 g/dL (ref 31.5–35.7)
MCV: 92 fL (ref 79–97)
Monocytes Absolute: 0.6 10*3/uL (ref 0.1–0.9)
Monocytes: 9 %
Neutrophils Absolute: 2.9 10*3/uL (ref 1.4–7.0)
Neutrophils: 46 %
Platelets: 202 10*3/uL (ref 150–450)
RBC: 4.44 x10E6/uL (ref 4.14–5.80)
RDW: 12.9 % (ref 11.6–15.4)
WBC: 6.3 10*3/uL (ref 3.4–10.8)

## 2021-07-11 LAB — LIPID PANEL
Chol/HDL Ratio: 4.1 ratio (ref 0.0–5.0)
Cholesterol, Total: 175 mg/dL (ref 100–199)
HDL: 43 mg/dL (ref >39)
LDL Chol Calc (NIH): 97 mg/dL (ref 0–99)
Triglycerides: 203 mg/dL — ABNORMAL HIGH (ref 0–149)
VLDL Cholesterol Cal: 35 mg/dL (ref 5–40)

## 2021-07-11 LAB — TSH: TSH: 1.93 u[IU]/mL (ref 0.450–4.500)

## 2021-07-11 LAB — T4, FREE: Free T4: 0.91 ng/dL (ref 0.82–1.77)

## 2021-07-11 MED ORDER — VENLAFAXINE HCL ER 150 MG PO CP24: 300.0000 mg | ORAL_CAPSULE | Freq: Every day | ORAL | 1 refills | Status: DC

## 2021-07-16 ENCOUNTER — Telehealth: Payer: Self-pay

## 2021-07-16 NOTE — Telephone Encounter (Signed)
Patient came in to the office because Kidney specialist called him to set him up for appointment and he was confuse why we had referred him.Reports that no one called him with lab results. Which is correct. Patient labs has been released to his mychart and not been seen due to patient not being able to log into my chart and no phone call was made from the office to review lab results. Reviewed labs results from 07/10/2021 with patient and answered his questions. Patient aware to come back for repeat labs in 1-3 months.

## 2021-07-21 ENCOUNTER — Ambulatory Visit: Payer: PPO | Admitting: Dermatology

## 2021-07-25 ENCOUNTER — Ambulatory Visit: Payer: Self-pay

## 2021-07-25 NOTE — Telephone Encounter (Signed)
  Chief Complaint: Nausea, fatigue, and dry mouth Symptoms: ibid  Frequency: 2 weeks Pertinent Negatives: Patient denies  Disposition: '[]'$ ED /'[]'$ Urgent Care (no appt availability in office) / '[x]'$ Appointment(In office/virtual)/ '[]'$  [facility]/ '[]'$ Home Care/ '[]'$ Refused Recommended Disposition /'[]'$ [facility]/ '[]'$  Follow-up with PCP Additional Notes: Pt has been feeling nauseous and has thrown up 3 times in 2 weeks. Pt is also fatigued and has a dry mouth. Pt thinks it may be due to medication change at last OV.    Summary: nausea   Patient states he has been feeling nauseous x2w, patient states he has been experiencing fatigue and dry mouth   Please follow up w/ patient      Reason for Disposition  Nausea lasts > 1 week  Answer Assessment - Initial Assessment Questions 1. NAUSEA SEVERITY: "How bad is the nausea?" (e.g., mild, moderate, severe; dehydration, weight loss)   - MILD: loss of appetite without change in eating habits   - MODERATE: decreased oral intake without significant weight loss, dehydration, or malnutrition   - SEVERE: inadequate caloric or fluid intake, significant weight loss, symptoms of dehydration     mild 2. ONSET: "When did the nausea begin?"     About 2 weeks 3. VOMITING: "Any vomiting?" If Yes, ask: "How many times today?"     Yes 3 times in 2 weeks 4. RECURRENT SYMPTOM: "Have you had nausea before?" If Yes, ask: "When was the last time?" "What happened that time?"      5. CAUSE: "What do you think is causing the nausea?"     Medication change 6. PREGNANCY: "Is there any chance you are pregnant?" (e.g., unprotected intercourse, missed birth control pill, broken condom)     na  Protocols used: Nausea-A-AH

## 2021-07-25 NOTE — Telephone Encounter (Signed)
Summary: nausea   Patient states he has been feeling nauseous x2w, patient states he has been experiencing fatigue and dry mouth   Please follow up w/ patient       Called pt  - no answer - call cannot be completed at this time.

## 2021-07-25 NOTE — Telephone Encounter (Signed)
Please advise. Venlafaxine was increased from  '150mg'$  daily to '300mg'$  daily after last office visit on 07/10/2021. Patient reports symptoms started after dosage change.

## 2021-07-29 NOTE — Progress Notes (Unsigned)
Established patient visit   Patient: Edward Rodriguez   DOB: 24-Jun-1940   81 y.o. Male  MRN: [id number] Visit Date: 07/30/2021  Today's healthcare provider: Gwyneth Sprout, FNP  Introduced to nurse practitioner role and practice setting.  All questions answered.  Discussed provider/patient relationship and expectations.   I,Tiffany J Bragg,acting as a scribe for Gwyneth Sprout, FNP.,have documented all relevant documentation on the behalf of Gwyneth Sprout, FNP,as directed by  Gwyneth Sprout, FNP while in the presence of Gwyneth Sprout, FNP.   Chief Complaint  Patient presents with   Nausea   Fatigue   Subjective    HPI   Edward Rodriguez is a 81 y.o. male who presents for evaluation of nausea and weakness.  It has been present for 2 weeks.  Associated signs & symptoms:  nausea, vomiting  , fatigue. States he is drinking more water, and pharmacist told him it was okay to take 2 effexor at a time and symptoms started around the same time.    Medications: Outpatient Medications Prior to Visit  Medication Sig   albuterol (VENTOLIN HFA) 108 (90 Base) MCG/ACT inhaler Inhale 2 puffs into the lungs every 6 (six) hours as needed for wheezing or shortness of breath.   clindamycin (CLEOCIN T) 1 % external solution Apply topically daily. Qd to aa scalp prn flares   clobetasol (TEMOVATE) 0.05 % external solution Patient to mix clobetasol solution in jar of CeraVe Cream. Apply to affected areas rash twice daily until improved. Avoid face, groin, axilla.   dupilumab (DUPIXENT) 300 MG/2ML prefilled syringe Inject 300 mg into the skin every 14 (fourteen) days. For maintenance.   FLUZONE HIGH-DOSE QUADRIVALENT 0.7 ML SUSY    hydrOXYzine (ATARAX) 10 MG tablet Take 1-2 tablets by mouth every evening as needed for itch.   ketoconazole (NIZORAL) 2 % shampoo ketoconazole 2 % shampoo  APPLY TOPICALLY ONCE FOR ONE DOSE. APPLY FIVE TIMES PER WEEK. MASSAGE INTO SCALP AND LEAVE IN FOR FIVE MINUTES BEFORE RINSING OUT    meloxicam (MOBIC) 15 MG tablet Take 15 mg by mouth daily.    omeprazole (PRILOSEC) 20 MG capsule Take 40 mg by mouth every other day.   PFIZER COVID-19 VAC BIVALENT injection    pramoxine-hydrocortisone (PROCTOCREAM-HC) 1-1 % rectal cream Place rectally 3 (three) times daily. As needed for hemorrhoids   sildenafil (REVATIO) 20 MG tablet TAKE 2 TO 5 TABLETS BY MOUTH ONCE DAILY AS NEEDED FOR  DIFFICLUTY  OBTAINING  /  MAINTAINING  ERECTION   SSD 1 % cream APPLY TOPICALLY DAILY   triamcinolone (KENALOG) 0.1 % APPLY  CREAM EXTERNALLY TO AFFECTED AREA TWICE DAILY RASH  OR  ITCHING AS NEEDED.  AVOID  FACE,  GROIN  AND  AXILLA.   venlafaxine XR (EFFEXOR-XR) 150 MG 24 hr capsule Take 2 capsules (300 mg total) by mouth daily with breakfast.   No facility-administered medications prior to visit.    Review of Systems     Objective    BP 132/81 (BP Location: Left Arm, Patient Position: Sitting, Cuff Size: Normal)   Pulse 76   Temp 98.2 F (36.8 C) (Oral)   Resp 16   Ht '5\' 11"'$  (1.803 m)   Wt 194 lb 1.6 oz (88 kg)   SpO2 98%   BMI 27.07 kg/m    Physical Exam Vitals and nursing note reviewed.  Constitutional:      Appearance: Normal appearance. He is overweight.  HENT:  Head: Normocephalic and atraumatic.  Eyes:     Pupils: Pupils are equal, round, and reactive to light.  Cardiovascular:     Rate and Rhythm: Normal rate and regular rhythm.     Pulses: Normal pulses.     Heart sounds: Normal heart sounds.  Pulmonary:     Effort: Pulmonary effort is normal.     Breath sounds: Normal breath sounds.  Musculoskeletal:        General: Normal range of motion.  Skin:    Capillary Refill: Capillary refill takes less than 2 seconds.  Neurological:     General: No focal deficit present.     Mental Status: He is alert and oriented to person, place, and time. Mental status is at baseline.  Psychiatric:        Mood and Affect: Mood normal.        Behavior: Behavior normal.        Thought  Content: Thought content normal.        Judgment: Judgment normal.       No results found for any visits on 07/30/21.  Assessment & Plan     Problem List Items Addressed This Visit       Genitourinary   Stage 3a chronic kidney disease ([place]) - Primary    Printed copy of lab results provided; has upcoming appt with nephrology Is working on drinking more water Had acute complaints of nausea/vomiting with activity in the sun; now resolved         Other   Anxiety and depression    Chronic, improved Currently taking 300 mg (2 150 mg) effexor daily; does not believe that symptoms of nausea/vomiting are related; however, they did occur at same time.  Has taken 300 mg for 3 days at this time, previously had titrated back down to 150 mg, and symptoms continued.   Noted improved mood with use of 300 mg.        Return in about 6 months (around 01/29/2022) for chonic disease management.      Vonna Kotyk, FNP, have reviewed all documentation for this visit. The documentation on 07/30/21 for the exam, diagnosis, procedures, and orders are all accurate and complete.    Gwyneth Sprout, [place] [phone] (phone) [phone] (fax)  [place]

## 2021-07-30 ENCOUNTER — Encounter: Payer: Self-pay | Admitting: Family Medicine

## 2021-07-30 ENCOUNTER — Ambulatory Visit (INDEPENDENT_AMBULATORY_CARE_PROVIDER_SITE_OTHER): Payer: PPO | Admitting: Family Medicine

## 2021-07-30 VITALS — BP 132/81 | HR 76 | Temp 98.2°F | Resp 16 | Ht 71.0 in | Wt 194.1 lb

## 2021-07-30 DIAGNOSIS — F32A Depression, unspecified: Secondary | ICD-10-CM | POA: Diagnosis not present

## 2021-07-30 DIAGNOSIS — N1831 Chronic kidney disease, stage 3a: Secondary | ICD-10-CM | POA: Diagnosis not present

## 2021-07-30 DIAGNOSIS — F419 Anxiety disorder, unspecified: Secondary | ICD-10-CM

## 2021-07-30 NOTE — Assessment & Plan Note (Signed)
Chronic, improved Currently taking 300 mg (2 150 mg) effexor daily; does not believe that symptoms of nausea/vomiting are related; however, they did occur at same time.  Has taken 300 mg for 3 days at this time, previously had titrated back down to 150 mg, and symptoms continued.   Noted improved mood with use of 300 mg.

## 2021-07-30 NOTE — Assessment & Plan Note (Signed)
Printed copy of lab results provided; has upcoming appt with nephrology Is working on drinking more water Had acute complaints of nausea/vomiting with activity in the sun; now resolved

## 2021-08-04 ENCOUNTER — Ambulatory Visit: Payer: PPO | Admitting: Dermatology

## 2021-08-04 DIAGNOSIS — L2081 Atopic neurodermatitis: Secondary | ICD-10-CM | POA: Diagnosis not present

## 2021-08-04 DIAGNOSIS — Z79899 Other long term (current) drug therapy: Secondary | ICD-10-CM

## 2021-08-04 NOTE — Progress Notes (Signed)
   Follow-Up Visit   Subjective  Edward Rodriguez is a 81 y.o. male who presents for the following: atopic neurodermatitis (Hx at trunk, patient is still working on paper work for patient assistance program for [employer]. Currently on dupixent qowk, clobetasol / cerave mix, hydroxazine 10 mg , and allegra. Patient reports no new active flares. No side effects. ).  The patient has spots, moles and lesions to be evaluated, some may be new or changing and the patient has concerns that these could be cancer.   The following portions of the chart were reviewed this encounter and updated as appropriate:      Review of Systems: No other skin or systemic complaints except as noted in HPI or Assessment and Plan.   Objective  Well appearing patient in no apparent distress; mood and affect are within normal limits.  A focused examination was performed including face, arms, legs, abdomen. Relevant physical exam findings are noted in the Assessment and Plan.  trunk Hyperpigmented papules BL flanks with thinning   Assessment & Plan  Atopic neurodermatitis trunk  With prurigo nodules, Chronic and persistent condition with duration or expected duration over one year. Condition is symptomatic/ bothersome to patient. Improving on Dupixent, not currently at goal.    Atopic dermatitis - Severe, on Dupixent (biologic medication).  Atopic dermatitis (eczema) is a chronic, relapsing, pruritic condition that can significantly affect quality of life. It is often associated with allergic rhinitis and/or asthma and can require treatment with topical medications, phototherapy, or in severe cases a biologic medication called Dupixent, which requires long term medication management.     Cont Dupixent 300mg /82ml sq injections qowk, samples x 2 Lot [id number] 11/2023, pt self-injects at home Cont Clobetasol/Cerave mix qd/bid prn itch, avoid f/g/a Cont Hydroxyzine 10mg  1 po hs prn flares Allegra 180mg  1 po qam   Discussed  with pt to call Dupixent [employer] to get copay assistance for Dupixent.   Dupilumab (Dupixent) is a treatment given by injection for adults and children with moderate-to-severe atopic dermatitis. Goal is control of skin condition, not cure. It is given as 2 injections at the first dose followed by 1 injection ever 2 weeks thereafter.  Young children are dosed monthly.   Potential side effects include allergic reaction, herpes infections, injection site reactions and conjunctivitis (inflammation of the eyes).  The use of Dupixent requires long term medication management, including periodic office visits.    Topical steroids (such as triamcinolone, fluocinolone, fluocinonide, mometasone, clobetasol, halobetasol, betamethasone, hydrocortisone) can cause thinning and lightening of the skin if they are used for too long in the same area. Your physician has selected the right strength medicine for your problem and area affected on the body. Please use your medication only as directed by your physician to prevent side effects.     Related Medications hydrOXYzine (ATARAX) 10 MG tablet Take 1-2 tablets by mouth every evening as needed for itch.  clobetasol (TEMOVATE) 0.05 % external solution Patient to mix clobetasol solution in jar of CeraVe Cream. Apply to affected areas rash twice daily until improved. Avoid face, groin, axilla.  dupilumab (DUPIXENT) 300 MG/2ML prefilled syringe Inject 300 mg into the skin every 14 (fourteen) days. For maintenance.   Return in about 1 month (around 09/03/2021) for atopic neurodermatitis . I, Asher Muir, CMA, am acting as scribe for Willeen Niece, MD..  Documentation: I have reviewed the above documentation for accuracy and completeness, and I agree with the above.  Willeen Niece MD

## 2021-08-06 ENCOUNTER — Other Ambulatory Visit: Payer: Self-pay | Admitting: Nephrology

## 2021-08-06 DIAGNOSIS — N1831 Chronic kidney disease, stage 3a: Secondary | ICD-10-CM | POA: Diagnosis not present

## 2021-08-06 DIAGNOSIS — R82998 Other abnormal findings in urine: Secondary | ICD-10-CM

## 2021-08-06 DIAGNOSIS — F419 Anxiety disorder, unspecified: Secondary | ICD-10-CM | POA: Diagnosis not present

## 2021-08-06 DIAGNOSIS — K219 Gastro-esophageal reflux disease without esophagitis: Secondary | ICD-10-CM | POA: Diagnosis not present

## 2021-08-06 DIAGNOSIS — F32A Depression, unspecified: Secondary | ICD-10-CM | POA: Diagnosis not present

## 2021-08-18 ENCOUNTER — Other Ambulatory Visit: Payer: Self-pay | Admitting: Family Medicine

## 2021-08-18 DIAGNOSIS — N529 Male erectile dysfunction, unspecified: Secondary | ICD-10-CM

## 2021-08-19 ENCOUNTER — Ambulatory Visit
Admission: RE | Admit: 2021-08-19 | Discharge: 2021-08-19 | Disposition: A | Discharge status: Home / Self Care | Payer: PPO | Source: Ambulatory Visit | Attending: Nephrology | Admitting: Nephrology

## 2021-08-19 DIAGNOSIS — N2889 Other specified disorders of kidney and ureter: Secondary | ICD-10-CM | POA: Diagnosis not present

## 2021-08-19 DIAGNOSIS — N1831 Chronic kidney disease, stage 3a: Secondary | ICD-10-CM | POA: Insufficient documentation

## 2021-08-19 DIAGNOSIS — R82998 Other abnormal findings in urine: Secondary | ICD-10-CM | POA: Diagnosis not present

## 2021-08-19 DIAGNOSIS — N189 Chronic kidney disease, unspecified: Secondary | ICD-10-CM | POA: Diagnosis not present

## 2021-08-20 ENCOUNTER — Other Ambulatory Visit: Payer: Self-pay | Admitting: Nephrology

## 2021-08-20 DIAGNOSIS — N281 Cyst of kidney, acquired: Secondary | ICD-10-CM | POA: Insufficient documentation

## 2021-08-20 DIAGNOSIS — N1831 Chronic kidney disease, stage 3a: Secondary | ICD-10-CM

## 2021-08-20 DIAGNOSIS — K219 Gastro-esophageal reflux disease without esophagitis: Secondary | ICD-10-CM

## 2021-08-28 DIAGNOSIS — M7542 Impingement syndrome of left shoulder: Secondary | ICD-10-CM | POA: Diagnosis not present

## 2021-08-28 DIAGNOSIS — M180 Bilateral primary osteoarthritis of first carpometacarpal joints: Secondary | ICD-10-CM | POA: Diagnosis not present

## 2021-09-01 ENCOUNTER — Ambulatory Visit: Admission: RE | Admit: 2021-09-01 | Payer: PPO | Source: Ambulatory Visit

## 2021-09-03 ENCOUNTER — Ambulatory Visit: Payer: PPO | Admitting: Dermatology

## 2021-09-09 ENCOUNTER — Ambulatory Visit
Admission: RE | Admit: 2021-09-09 | Discharge: 2021-09-09 | Disposition: A | Discharge status: Home / Self Care | Payer: PPO | Source: Ambulatory Visit | Attending: Nephrology | Admitting: Nephrology

## 2021-09-09 DIAGNOSIS — N281 Cyst of kidney, acquired: Secondary | ICD-10-CM | POA: Diagnosis not present

## 2021-09-09 DIAGNOSIS — N2889 Other specified disorders of kidney and ureter: Secondary | ICD-10-CM | POA: Diagnosis not present

## 2021-09-09 DIAGNOSIS — N1831 Chronic kidney disease, stage 3a: Secondary | ICD-10-CM | POA: Diagnosis not present

## 2021-09-09 DIAGNOSIS — K7689 Other specified diseases of liver: Secondary | ICD-10-CM | POA: Diagnosis not present

## 2021-09-09 DIAGNOSIS — Z0389 Encounter for observation for other suspected diseases and conditions ruled out: Secondary | ICD-10-CM | POA: Diagnosis not present

## 2021-09-09 DIAGNOSIS — K219 Gastro-esophageal reflux disease without esophagitis: Secondary | ICD-10-CM | POA: Diagnosis not present

## 2021-09-09 MED ORDER — GADOBUTROL 1 MMOL/ML IV SOLN
8.0000 mL | Freq: Once | INTRAVENOUS | Status: AC | PRN
  Administered 2021-09-09: 8 mL via INTRAVENOUS

## 2021-09-10 ENCOUNTER — Ambulatory Visit: Payer: PPO | Admitting: Family Medicine

## 2021-09-15 DIAGNOSIS — N1831 Chronic kidney disease, stage 3a: Secondary | ICD-10-CM | POA: Diagnosis not present

## 2021-09-15 DIAGNOSIS — R82998 Other abnormal findings in urine: Secondary | ICD-10-CM | POA: Diagnosis not present

## 2021-09-15 DIAGNOSIS — F419 Anxiety disorder, unspecified: Secondary | ICD-10-CM | POA: Diagnosis not present

## 2021-09-15 DIAGNOSIS — F32A Depression, unspecified: Secondary | ICD-10-CM | POA: Diagnosis not present

## 2021-09-15 DIAGNOSIS — K219 Gastro-esophageal reflux disease without esophagitis: Secondary | ICD-10-CM | POA: Diagnosis not present

## 2021-09-18 NOTE — Progress Notes (Deleted)
Established patient visit   Patient: Edward Rodriguez   DOB: 04/08/1940   81 y.o. Male  MRN: [id number] Visit Date: 09/19/2021  Today's healthcare provider: Gwyneth Sprout, FNP   No chief complaint on file.  Subjective    HPI  Depression, Follow-up  He  was last seen for this 2 months ago. Changes made at last visit include no changes.   He reports {excellent/good/fair/poor:19665} compliance with treatment. He {is/is not:21021397} having side effects. ***  He reports {DESC; GOOD/FAIR/POOR:18685} tolerance of treatment. Current symptoms include: {Symptoms; depression:1002} He feels he is {improved/worse/unchanged:3041574} since last visit.     07/10/2021   10:16 AM 04/07/2021   10:33 AM 09/05/2020    2:37 PM  Depression screen PHQ 2/9  Decreased Interest 1 0 0  Down, Depressed, Hopeless 1 0 0  PHQ - 2 Score 2 0 0  Altered sleeping 3  0  Tired, decreased energy 3  0  Change in appetite 2    Feeling bad or failure about yourself  2  0  Trouble concentrating 2  0  Moving slowly or fidgety/restless 0  0  Suicidal thoughts 0  0  PHQ-9 Score 14  0  Difficult doing work/chores Somewhat difficult  Not difficult at all    -----------------------------------------------------------------------------------------   Medications: Outpatient Medications Prior to Visit  Medication Sig   albuterol (VENTOLIN HFA) 108 (90 Base) MCG/ACT inhaler Inhale 2 puffs into the lungs every 6 (six) hours as needed for wheezing or shortness of breath.   clindamycin (CLEOCIN T) 1 % external solution Apply topically daily. Qd to aa scalp prn flares   clobetasol (TEMOVATE) 0.05 % external solution Patient to mix clobetasol solution in jar of CeraVe Cream. Apply to affected areas rash twice daily until improved. Avoid face, groin, axilla.   dupilumab (DUPIXENT) 300 MG/2ML prefilled syringe Inject 300 mg into the skin every 14 (fourteen) days. For maintenance.   FLUZONE HIGH-DOSE QUADRIVALENT 0.7 ML SUSY     hydrOXYzine (ATARAX) 10 MG tablet Take 1-2 tablets by mouth every evening as needed for itch.   ketoconazole (NIZORAL) 2 % shampoo ketoconazole 2 % shampoo  APPLY TOPICALLY ONCE FOR ONE DOSE. APPLY FIVE TIMES PER WEEK. MASSAGE INTO SCALP AND LEAVE IN FOR FIVE MINUTES BEFORE RINSING OUT   meloxicam (MOBIC) 15 MG tablet Take 15 mg by mouth daily.    omeprazole (PRILOSEC) 20 MG capsule Take 40 mg by mouth every other day.   PFIZER COVID-19 VAC BIVALENT injection    pramoxine-hydrocortisone (PROCTOCREAM-HC) 1-1 % rectal cream Place rectally 3 (three) times daily. As needed for hemorrhoids   sildenafil (REVATIO) 20 MG tablet TAKE TWO TO FIVE TABLETS BY MOUTH ONCE DAILY AS NEEDED FOR DIFFICULTY OBTAINING/MAINTAINING ERECTION   SSD 1 % cream APPLY TOPICALLY DAILY   triamcinolone (KENALOG) 0.1 % APPLY  CREAM EXTERNALLY TO AFFECTED AREA TWICE DAILY RASH  OR  ITCHING AS NEEDED.  AVOID  FACE,  GROIN  AND  AXILLA.   venlafaxine XR (EFFEXOR-XR) 150 MG 24 hr capsule Take 2 capsules (300 mg total) by mouth daily with breakfast.   No facility-administered medications prior to visit.    Review of Systems  {Labs  Heme  Chem  Endocrine  Serology  Results Review (optional):23779}   Objective    There were no vitals taken for this visit. {Show previous vital signs (optional):23777}  Physical Exam  ***  No results found for any visits on 09/19/21.  Assessment & Plan     ***  No follow-ups on file.      {provider attestation***:1}   Gwyneth Sprout, [place] [phone] (phone) [phone] (fax)  [place]

## 2021-09-19 ENCOUNTER — Ambulatory Visit: Payer: PPO | Admitting: Family Medicine

## 2021-09-22 NOTE — Progress Notes (Unsigned)
Established patient visit   Patient: Edward Rodriguez   DOB: [birth year]   81 y.o. Male  MRN: [id number] Visit Date: 09/23/2021  Today's healthcare provider: Gwyneth Sprout, FNP  Re Introduced to nurse practitioner role and practice setting.  All questions answered.  Discussed provider/patient relationship and expectations.   I,Tiffany J Bragg,acting as a scribe for Gwyneth Sprout, FNP.,have documented all relevant documentation on the behalf of Gwyneth Sprout, FNP,as directed by  Gwyneth Sprout, FNP while in the presence of Gwyneth Sprout, FNP.   Chief Complaint  Patient presents with   Chronic Kidney Disease   Subjective    HPI  Follow up for stage 3 CKD  The patient was last seen for this 2 months ago. Changes made at last visit include keep appt with nephrology. Continue to drink more water.  He reports excellent compliance with treatment. He feels that condition is Unchanged. He is having side effects. Vomitting.  -----------------------------------------------------------------------------------------   Medications: Outpatient Medications Prior to Visit  Medication Sig   albuterol (VENTOLIN HFA) 108 (90 Base) MCG/ACT inhaler Inhale 2 puffs into the lungs every 6 (six) hours as needed for wheezing or shortness of breath.   clindamycin (CLEOCIN T) 1 % external solution Apply topically daily. Qd to aa scalp prn flares   clobetasol (TEMOVATE) 0.05 % external solution Patient to mix clobetasol solution in jar of CeraVe Cream. Apply to affected areas rash twice daily until improved. Avoid face, groin, axilla.   dupilumab (DUPIXENT) 300 MG/2ML prefilled syringe Inject 300 mg into the skin every 14 (fourteen) days. For maintenance.   FLUZONE HIGH-DOSE QUADRIVALENT 0.7 ML SUSY    hydrOXYzine (ATARAX) 10 MG tablet Take 1-2 tablets by mouth every evening as needed for itch.   ketoconazole (NIZORAL) 2 % shampoo ketoconazole 2 % shampoo  APPLY TOPICALLY ONCE FOR ONE DOSE. APPLY FIVE  TIMES PER WEEK. MASSAGE INTO SCALP AND LEAVE IN FOR FIVE MINUTES BEFORE RINSING OUT   meloxicam (MOBIC) 15 MG tablet Take 15 mg by mouth daily.    omeprazole (PRILOSEC) 20 MG capsule Take 40 mg by mouth every other day.   PFIZER COVID-19 VAC BIVALENT injection    pramoxine-hydrocortisone (PROCTOCREAM-HC) 1-1 % rectal cream Place rectally 3 (three) times daily. As needed for hemorrhoids   sildenafil (REVATIO) 20 MG tablet TAKE TWO TO FIVE TABLETS BY MOUTH ONCE DAILY AS NEEDED FOR DIFFICULTY OBTAINING/MAINTAINING ERECTION   SSD 1 % cream APPLY TOPICALLY DAILY   triamcinolone (KENALOG) 0.1 % APPLY  CREAM EXTERNALLY TO AFFECTED AREA TWICE DAILY RASH  OR  ITCHING AS NEEDED.  AVOID  FACE,  GROIN  AND  AXILLA.   venlafaxine XR (EFFEXOR-XR) 150 MG 24 hr capsule Take 2 capsules (300 mg total) by mouth daily with breakfast.   No facility-administered medications prior to visit.    Review of Systems  Last CBC Lab Results  Component Value Date   WBC 6.3 07/10/2021   HGB 14.5 07/10/2021   HCT 41.0 07/10/2021   MCV 92 07/10/2021   MCH 32.7 07/10/2021   RDW 12.9 07/10/2021   PLT 202 42/68/3419   Last metabolic panel Lab Results  Component Value Date   GLUCOSE 110 (H) 07/10/2021   NA 139 07/10/2021   K 4.5 07/10/2021   CL 102 07/10/2021   CO2 22 07/10/2021   BUN 18 07/10/2021   CREATININE 1.42 (H) 07/10/2021   EGFR 50 (L) 07/10/2021   CALCIUM 9.5 07/10/2021   PHOS  3.1 05/09/2018   PROT 6.6 07/10/2021   ALBUMIN 4.3 07/10/2021   LABGLOB 2.3 07/10/2021   AGRATIO 1.9 07/10/2021   BILITOT 0.2 07/10/2021   ALKPHOS 83 07/10/2021   AST 18 07/10/2021   ALT 15 07/10/2021   ANIONGAP 5 06/19/2014   Last lipids Lab Results  Component Value Date   CHOL 175 07/10/2021   HDL 43 07/10/2021   LDLCALC 97 07/10/2021   TRIG 203 (H) 07/10/2021   CHOLHDL 4.1 07/10/2021   Last thyroid functions Lab Results  Component Value Date   TSH 1.930 07/10/2021       Objective    BP 124/78 (BP  Location: Right Arm, Patient Position: Sitting, Cuff Size: Normal)   Pulse 81   Temp 97.8 F (36.6 C) (Oral)   Resp 16   Ht '6\' 1"'  (1.854 m)   Wt 183 lb (83 kg)   SpO2 99%   BMI 24.14 kg/m   BP Readings from Last 3 Encounters:  09/23/21 124/78  07/30/21 132/81  07/10/21 115/76   Wt Readings from Last 3 Encounters:  09/23/21 183 lb (83 kg)  07/30/21 194 lb 1.6 oz (88 kg)  07/10/21 198 lb (89.8 kg)   SpO2 Readings from Last 3 Encounters:  09/23/21 99%  07/30/21 98%  07/10/21 95%      Physical Exam Vitals and nursing note reviewed.  Constitutional:      Appearance: Normal appearance. He is normal weight.  HENT:     Head: Normocephalic and atraumatic.  Eyes:     Pupils: Pupils are equal, round, and reactive to light.  Cardiovascular:     Rate and Rhythm: Normal rate and regular rhythm.     Pulses: Normal pulses.     Heart sounds: Normal heart sounds.  Pulmonary:     Effort: Pulmonary effort is normal.     Breath sounds: Normal breath sounds.  Musculoskeletal:        General: Normal range of motion.     Cervical back: Normal range of motion.  Skin:    General: Skin is warm and dry.     Capillary Refill: Capillary refill takes less than 2 seconds.  Neurological:     General: No focal deficit present.     Mental Status: He is alert and oriented to person, place, and time. Mental status is at baseline.  Psychiatric:        Mood and Affect: Mood normal.        Behavior: Behavior normal.        Thought Content: Thought content normal.        Judgment: Judgment normal.      No results found for any visits on 09/23/21.  Assessment & Plan     Problem List Items Addressed This Visit       Cardiovascular and Mediastinum   Atherosclerotic PVD with intermittent claudication (HCC)    Chronic, stable Agreeable to start Crestor 5 mg Able to play 18 holes of golf with intermittent stopping       Relevant Medications   rosuvastatin (CRESTOR) 5 MG tablet   Senile  purpura (HCC)    Chronic, stable No recent injuries Followed by derm Hx of poor healing       Relevant Medications   rosuvastatin (CRESTOR) 5 MG tablet     Genitourinary   Stage 3a chronic kidney disease ([facility]) - Primary    Chronic, stable Has taken note of dietary recommendations from specialist Last Creatinine of 1.4 with eGFR  of 80 Has been working on drinking more water Has an appt later today; reports that labs were completed however, not visible on care everywhere during appt       Relevant Orders   Renal Function Panel   CBC with Differential/Platelet   PTH, Intact and Calcium   Urinalysis, Routine w reflex microscopic     Other   Anxiety and depression    Chronic, improved Reports some nausea with 300 mg Effexor on day of golf- encouraged to use 150 mg on golf days, as they are only 1x/week Patient reports "this is the best thing we've done; my psyche is great"        Return in about 4 months (around 01/23/2022) for chonic disease management.      Vonna Kotyk, FNP, have reviewed all documentation for this visit. The documentation on 09/23/21 for the exam, diagnosis, procedures, and orders are all accurate and complete.    Gwyneth Sprout, [place] [phone] (phone) [phone] (fax)  [place]

## 2021-09-23 ENCOUNTER — Encounter: Payer: Self-pay | Admitting: Family Medicine

## 2021-09-23 ENCOUNTER — Ambulatory Visit (INDEPENDENT_AMBULATORY_CARE_PROVIDER_SITE_OTHER): Payer: PPO | Admitting: Family Medicine

## 2021-09-23 VITALS — BP 124/78 | HR 81 | Temp 97.8°F | Resp 16 | Ht 73.0 in | Wt 183.0 lb

## 2021-09-23 DIAGNOSIS — I70219 Atherosclerosis of native arteries of extremities with intermittent claudication, unspecified extremity: Secondary | ICD-10-CM

## 2021-09-23 DIAGNOSIS — F419 Anxiety disorder, unspecified: Secondary | ICD-10-CM

## 2021-09-23 DIAGNOSIS — N1831 Chronic kidney disease, stage 3a: Secondary | ICD-10-CM | POA: Diagnosis not present

## 2021-09-23 DIAGNOSIS — D692 Other nonthrombocytopenic purpura: Secondary | ICD-10-CM

## 2021-09-23 DIAGNOSIS — F32A Depression, unspecified: Secondary | ICD-10-CM | POA: Diagnosis not present

## 2021-09-23 DIAGNOSIS — N281 Cyst of kidney, acquired: Secondary | ICD-10-CM | POA: Diagnosis not present

## 2021-09-23 DIAGNOSIS — K219 Gastro-esophageal reflux disease without esophagitis: Secondary | ICD-10-CM | POA: Diagnosis not present

## 2021-09-23 MED ORDER — ROSUVASTATIN CALCIUM 5 MG PO TABS: 5.0000 mg | ORAL_TABLET | Freq: Every day | ORAL | 3 refills | Status: DC

## 2021-09-23 NOTE — Assessment & Plan Note (Signed)
Chronic, improved Reports some nausea with 300 mg Effexor on day of golf- encouraged to use 150 mg on golf days, as they are only 1x/week Patient reports "this is the best thing we've done; my psyche is great"

## 2021-09-23 NOTE — Assessment & Plan Note (Signed)
Chronic, stable Has taken note of dietary recommendations from specialist Last Creatinine of 1.4 with eGFR of 50 Has been working on drinking more water Has an appt later today; reports that labs were completed however, not visible on care everywhere during appt

## 2021-09-23 NOTE — Assessment & Plan Note (Signed)
Chronic, stable Agreeable to start Crestor 5 mg Able to play 18 holes of golf with intermittent stopping

## 2021-09-23 NOTE — Assessment & Plan Note (Signed)
Chronic, stable No recent injuries Followed by derm Hx of poor healing

## 2021-09-24 ENCOUNTER — Encounter: Payer: Self-pay | Admitting: Dermatology

## 2021-09-24 ENCOUNTER — Ambulatory Visit: Payer: PPO | Admitting: Dermatology

## 2021-09-24 DIAGNOSIS — L570 Actinic keratosis: Secondary | ICD-10-CM | POA: Diagnosis not present

## 2021-09-24 DIAGNOSIS — L739 Follicular disorder, unspecified: Secondary | ICD-10-CM | POA: Diagnosis not present

## 2021-09-24 DIAGNOSIS — Z79899 Other long term (current) drug therapy: Secondary | ICD-10-CM

## 2021-09-24 DIAGNOSIS — L281 Prurigo nodularis: Secondary | ICD-10-CM | POA: Diagnosis not present

## 2021-09-24 DIAGNOSIS — L2081 Atopic neurodermatitis: Secondary | ICD-10-CM | POA: Diagnosis not present

## 2021-09-24 MED ORDER — KETOCONAZOLE 2 % EX SHAM: MEDICATED_SHAMPOO | CUTANEOUS | 4 refills | Status: DC

## 2021-09-24 NOTE — Patient Instructions (Addendum)
Eczema/Rash:  Cont Dupixent '300mg'$ /37m sq injections qowk; pt self-injects at home Cont Clobetasol/Cerave mix qd/bid prn itch, avoid f/g/a Cont Hydroxyzine '10mg'$  1 po hs prn flares Allegra '180mg'$  1 po qam   Scalp:  Start ketoconazole 2% shampoo 2-3 times per week lather on scalp, leave on 5-8 minutes, rinse well.   Cryotherapy Aftercare  Wash gently with soap and water everyday.   Apply Vaseline daily until healed.    Dupilumab (Dupixent) is a treatment given by injection for adults and children with moderate-to-severe atopic dermatitis. Goal is control of skin condition, not cure. It is given as 2 injections at the first dose followed by 1 injection ever 2 weeks thereafter.  Young children are dosed monthly.  Potential side effects include allergic reaction, herpes infections, injection site reactions and conjunctivitis (inflammation of the eyes).  The use of Dupixent requires long term medication management, including periodic office visits.   Topical steroids (such as triamcinolone, fluocinolone, fluocinonide, mometasone, clobetasol, halobetasol, betamethasone, hydrocortisone) can cause thinning and lightening of the skin if they are used for too long in the same area. Your physician has selected the right strength medicine for your problem and area affected on the body. Please use your medication only as directed by your physician to prevent side effects.    Due to recent changes in healthcare laws, you may see results of your pathology and/or laboratory studies on MyChart before the doctors have had a chance to review them. We understand that in some cases there may be results that are confusing or concerning to you. Please understand that not all results are received at the same time and often the doctors may need to interpret multiple results in order to provide you with the best plan of care or course of treatment. Therefore, we ask that you please give u[place]2 business days to thoroughly  review all your results before contacting the office for clarification. Should we see a critical lab result, you will be contacted sooner.   If You Need Anything After Your Visit  If you have any questions or concerns for your doctor, please call our main line at [phone] press option 4 to reach your doctor's medical assistant. If no one answers, please leave a voicemail as directed and we will return your call as soon as possible. Messages left after 4 pm will be answered the following business day.   You may also send [place] message via [place] We typically respond to MyChart messages within 1-2 business days.  For prescription refills, please ask your pharmacy to contact our office. Our fax number is [phone]  If you have an urgent issue when the clinic is closed that cannot wait until the next business day, you can page your doctor at the number below.    Please note that while we do our best to be available for urgent issues outside of office hours, we are not available 24/7.   If you have an urgent issue and are unable to reach u[place] you may choose to seek medical care at your doctor's office, retail clinic, urgent care center, or emergency room.  If you have a medical emergency, please immediately call 911 or go to the emergency department.  Pager Numbers  - Dr. KNehemiah Massed [phone] - Dr. MLaurence Ferrari [phone] - Dr. SNicole Kindred [phone] In the event of inclement weather, please call our main line at [phone] an update on the status of any delays or closures.  Dermatology Medication  Tips: Please keep the boxes that topical medications come in in order to help keep track of the instructions about where and how to use these. Pharmacies typically print the medication instructions only on the boxes and not directly on the medication tubes.   If your medication is too expensive, please contact our office at [phone] option 4 or send [place] a message through  [place].   We are unable to tell what your co-pay for medications will be in advance as this is different depending on your insurance coverage. However, we may be able to find a substitute medication at lower cost or fill out paperwork to get insurance to cover a needed medication.   If a prior authorization is required to get your medication covered by your insurance company, please allow [place] 1-2 business days to complete this process.  Drug prices often vary depending on where the prescription is filled and some pharmacies may offer cheaper prices.  The website www.goodrx.com contains coupons for medications through different pharmacies. The prices here do not account for what the cost may be with help from insurance (it may be cheaper with your insurance), but the website can give you the price if you did not use any insurance.  - You can print the associated coupon and take it with your prescription to the pharmacy.  - You may also stop by our office during regular business hours and pick up a GoodRx coupon card.  - If you need your prescription sent electronically to a different pharmacy, notify our office through [facility] or by phone at [phone] option 4.     Si Usted Necesita Algo Despus de Su Visita  Tambin puede enviarnos un mensaje a travs de Pharmacist, community. Por lo general respondemos a los mensajes de MyChart en el transcurso de 1 a 2 das hbiles.  Para renovar recetas, por favor pida a su farmacia que se ponga en contacto con nuestra oficina. Harland Dingwall de fax es [place] [phone].  Si tiene un asunto urgente cuando la clnica est cerrada y que no puede esperar hasta el siguiente da hbil, puede llamar/localizar a su doctor(a) al nmero que aparece a continuacin.   Por favor, tenga en cuenta que aunque hacemos todo lo posible para estar disponibles para asuntos urgentes fuera del horario de [place], no estamos disponibles las 24 horas del da, los 7 das de la  [place].   Si tiene un problema urgente y no puede comunicarse con nosotros, puede optar por buscar atencin mdica  en el consultorio de su doctor(a), en una clnica privada, en un centro de atencin urgente o en una sala de emergencias.  Si tiene Engineering geologist, por favor llame inmediatamente al 911 o vaya a la sala de emergencias.  Nmeros de bper  - Dr. Nehemiah Massed: [phone]  - Dra. Moye: [phone]  - Dra. Nicole Kindred: [phone]  En caso de inclemencias del [place], por favor llame a Johnsie Kindred principal al [phone] para una actualizacin sobre el [place] de cualquier retraso o cierre.  Consejos para la medicacin en dermatologa: Por favor, guarde las cajas en las que vienen los medicamentos de uso tpico para ayudarle a seguir las instrucciones sobre dnde y cmo usarlos. Las farmacias generalmente imprimen las instrucciones del medicamento slo en las cajas y no directamente en los tubos del [place].   Si su medicamento es muy caro, por favor, pngase en contacto con Zigmund Daniel llamando al [phone] y presione la opcin 4 o envenos un mensaje a  travs de MyChart.   No podemos decirle cul ser su copago por los medicamentos por adelantado ya que esto es diferente dependiendo de la cobertura de su seguro. Sin embargo, es posible que podamos encontrar un medicamento sustituto a Electrical engineer un formulario para que el seguro cubra el medicamento que se considera necesario.   Si se requiere una autorizacin previa para que su compaa de seguros [place] su medicamento, por favor permtanos de 1 a 2 das hbiles para completar este proceso.  Los precios de los medicamentos varan con frecuencia dependiendo del Environmental consultant de dnde se surte la receta y alguna farmacias pueden ofrecer precios ms baratos.  El sitio web www.goodrx.com tiene cupones para medicamentos de Airline pilot. Los precios aqu no tienen en cuenta lo que podra costar con la ayuda del  seguro (puede ser ms barato con su seguro), pero el sitio web puede darle el precio si no utiliz Research scientist (physical sciences).  - Puede imprimir el cupn correspondiente y llevarlo con su receta a la farmacia.  - Tambin puede pasar por nuestra oficina durante el horario de atencin regular y Charity fundraiser una tarjeta de cupones de GoodRx.  - Si necesita que su receta se enve electrnicamente a una farmacia diferente, informe a nuestra oficina a travs de MyChart de [place] o por telfono llamando al [phone] y presione la opcin 4.

## 2021-09-24 NOTE — Progress Notes (Signed)
Follow-Up Visit   Subjective  Edward Rodriguez is a 81 y.o. male who presents for the following: atopic neurodermatitis (6 week recheck. Torso. Currently on dupixent  qowk, clobetasol / cerave mix, hydroxyzine 10 mg , and allegra. Patient reports no new active flares. No adverse reactions or injection site reactions. Is receiving Dupixent Rx at home now. Still has some itching and flares at times but fewer and do not last as long).  Has had loading dose and second injection.  Scheduled for third injection this Friday.  Already not itching like he was.  Has spots on face and scalp to check.    The following portions of the chart were reviewed this encounter and updated as appropriate:      Review of Systems: No other skin or systemic complaints except as noted in HPI or Assessment and Plan.   Objective  Well appearing patient in no apparent distress; mood and affect are within normal limits.  A focused examination was performed including face, torso. Relevant physical exam findings are noted in the Assessment and Plan.  torso, scalp Erythema with healing excoriations at B/L flanks  Left Nasal Dorsum x2, right zygoma x1, right lateral eyebrow x1, left preauricular x1 (5) Erythematous thin papules/macules with gritty scale.   Scalp Pink scaly follicular papules at vertex and crown scalp   Assessment & Plan  Atopic neurodermatitis torso, scalp  With prurigo nodules, Chronic and persistent condition with duration or expected duration over one year. Condition is symptomatic/ bothersome to patient. Improving on Dupixent, not currently at goal.    Atopic dermatitis - Severe, on Dupixent (biologic medication).  Atopic dermatitis (eczema) is a chronic, relapsing, pruritic condition that can significantly affect quality of life. It is often associated with allergic rhinitis and/or asthma and can require treatment with topical medications, phototherapy, or in severe cases a biologic medication  called Dupixent, which requires long term medication management.     Cont Dupixent '300mg'$ /77m sq injections qowk; pt self-injects at home Cont Clobetasol/Cerave mix qd/bid prn itch, avoid f/g/a Cont Hydroxyzine '10mg'$  1 po hs prn flares Allegra '180mg'$  1 po qam  Related Medications hydrOXYzine (ATARAX) 10 MG tablet Take 1-2 tablets by mouth every evening as needed for itch.  clobetasol (TEMOVATE) 0.05 % external solution Patient to mix clobetasol solution in jar of CeraVe Cream. Apply to affected areas rash twice daily until improved. Avoid face, groin, axilla.  dupilumab (DUPIXENT) 300 MG/2ML prefilled syringe Inject 300 mg into the skin every 14 (fourteen) days. For maintenance.  AK (actinic keratosis) (5) Left Nasal Dorsum x2, right zygoma x1, right lateral eyebrow x1, left preauricular x1  Actinic keratoses are precancerous spots that appear secondary to cumulative UV radiation exposure/sun exposure over time. They are chronic with expected duration over 1 year. A portion of actinic keratoses will progress to squamous cell carcinoma of the skin. It is not possible to reliably predict which spots will progress to skin cancer and so treatment is recommended to prevent development of skin cancer.  Recommend daily broad spectrum sunscreen SPF 30+ to sun-exposed areas, reapply every 2 hours as needed.  Recommend staying in the shade or wearing long sleeves, sun glasses (UVA+UVB protection) and wide brim hats (4-inch brim around the entire circumference of the hat). Call for new or changing lesions.  Destruction of lesion - Left Nasal Dorsum x2, right zygoma x1, right lateral eyebrow x1, left preauricular x1  Destruction method: cryotherapy   Informed consent: discussed and consent obtained   Lesion  destroyed using liquid nitrogen: Yes   Region frozen until ice ball extended beyond lesion: Yes   Outcome: patient tolerated procedure well with no complications   Post-procedure details: wound  care instructions given   Additional details:  Prior to procedure, discussed risks of blister formation, small wound, skin dyspigmentation, or rare scar following cryotherapy. Recommend Vaseline ointment to treated areas while healing.   Folliculitis Scalp  Chronic and persistent condition with duration or expected duration over one year. Condition is symptomatic/ bothersome to patient. Not currently at goal.  Start ketoconazole 2% shampoo 2-3 times per week lather on scalp, leave on 5-8 minutes, rinse well. Continue Clobetasol solution qd/bid to aas scalp prn flares  Avoid picking  ketoconazole (NIZORAL) 2 % shampoo - Scalp 2-3 times per week lather on scalp, leave on 5-8 minutes, rinse well.   Return in about 3 months (around 12/25/2021) for Atopic Dermatitis, Dupixent Follow Up.  I, Emelia Salisbury, CMA, am acting as scribe for Brendolyn Patty, MD.  Documentation: I have reviewed the above documentation for accuracy and completeness, and I agree with the above.  Brendolyn Patty MD

## 2021-09-29 ENCOUNTER — Other Ambulatory Visit: Payer: Self-pay

## 2021-09-29 DIAGNOSIS — L2081 Atopic neurodermatitis: Secondary | ICD-10-CM

## 2021-09-29 MED ORDER — DUPIXENT 300 MG/2ML ~~LOC~~ SOSY: 300.0000 mg | PREFILLED_SYRINGE | SUBCUTANEOUS | 3 refills | Status: DC

## 2021-09-29 NOTE — Progress Notes (Signed)
Refill request for [place] faxed from [facility]. Escripted

## 2021-09-30 ENCOUNTER — Other Ambulatory Visit: Payer: Self-pay

## 2021-09-30 DIAGNOSIS — L2081 Atopic neurodermatitis: Secondary | ICD-10-CM

## 2021-09-30 MED ORDER — DUPIXENT 300 MG/2ML ~~LOC~~ SOSY: 300.0000 mg | PREFILLED_SYRINGE | SUBCUTANEOUS | 3 refills | Status: DC

## 2021-10-31 DIAGNOSIS — H43813 Vitreous degeneration, bilateral: Secondary | ICD-10-CM | POA: Diagnosis not present

## 2021-10-31 DIAGNOSIS — Z961 Presence of intraocular lens: Secondary | ICD-10-CM | POA: Diagnosis not present

## 2021-10-31 DIAGNOSIS — M3501 Sicca syndrome with keratoconjunctivitis: Secondary | ICD-10-CM | POA: Diagnosis not present

## 2021-11-13 ENCOUNTER — Other Ambulatory Visit: Payer: Self-pay | Admitting: Family Medicine

## 2021-11-13 ENCOUNTER — Telehealth: Payer: Self-pay | Admitting: Family Medicine

## 2021-11-13 ENCOUNTER — Other Ambulatory Visit: Payer: Self-pay

## 2021-11-13 DIAGNOSIS — K649 Unspecified hemorrhoids: Secondary | ICD-10-CM

## 2021-11-13 MED ORDER — HYDROCORTISONE ACE-PRAMOXINE 1.85-1.15 % EX CREA: 1.0000 | TOPICAL_CREAM | CUTANEOUS | 0 refills | Status: DC | PRN

## 2021-11-13 NOTE — Telephone Encounter (Signed)
[facility] faxed refill request for the following medications:  pramoxine-hydrocortisone (PROCTOCREAM-HC) 1-1 % rectal cream   Please advise.

## 2021-11-21 ENCOUNTER — Ambulatory Visit (INDEPENDENT_AMBULATORY_CARE_PROVIDER_SITE_OTHER): Payer: PPO

## 2021-11-21 ENCOUNTER — Ambulatory Visit: Payer: PPO | Admitting: Podiatry

## 2021-11-21 DIAGNOSIS — M779 Enthesopathy, unspecified: Secondary | ICD-10-CM

## 2021-11-21 DIAGNOSIS — M7672 Peroneal tendinitis, left leg: Secondary | ICD-10-CM | POA: Diagnosis not present

## 2021-11-22 DIAGNOSIS — M7672 Peroneal tendinitis, left leg: Secondary | ICD-10-CM | POA: Diagnosis not present

## 2021-11-22 MED ORDER — BETAMETHASONE SOD PHOS & ACET 6 (3-3) MG/ML IJ SUSP
3.0000 mg | Freq: Once | INTRAMUSCULAR | Status: AC
  Administered 2021-11-22: 3 mg via INTRA_ARTICULAR

## 2021-11-22 NOTE — Progress Notes (Addendum)
   Chief Complaint  Patient presents with   Foot Pain    Patient is here for left foot pain on the top of the foot that he states that he has ha for years.    HPI: 81 y.o. male presenting today as a reestablish new patient for evaluation of pain and tenderness to the dorsal aspect of the left foot that has been ongoing for years.  He was last seen in the office on 07/13/2017 for plantar fascial pain that has resolved.  He now has pain and tenderness to the dorsum of the left foot that has been ongoing for few years now.  He has not done anything for treatment.  He denies a history of injury.  Past Medical History:  Diagnosis Date   Actinic keratosis 07/26/2007   R dorsum index finger - bx proven    Actinic keratosis 01/25/2008   L inf helix - bx proven    Actinic keratosis 05/16/2009   L lat canthus - bx proven    Anxiety    GERD (gastroesophageal reflux disease)    Vertigo 06/2014   had an episode in 2013.    Past Surgical History:  Procedure Laterality Date   ROTATOR CUFF REPAIR Left 06/2014   SHOULDER ARTHROSCOPY Left 06/27/2014   Procedure: ARTHROSCOPY SHOULDER- mini open rotator cuff repair;  Surgeon: Thornton Park, MD;  Location: [facility] ORS;  Service: Orthopedics;  Laterality: Left;   SHOULDER OPEN ROTATOR CUFF REPAIR Right 2011    Allergies  Allergen Reactions   Sulfa Antibiotics    Elemental Sulfur Rash     Physical Exam: General: The patient is alert and oriented x3 in no acute distress.  Dermatology: Skin is warm, dry and supple bilateral lower extremities. Negative for open lesions or macerations.  Vascular: Palpable pedal pulses bilaterally. Capillary refill within normal limits.  Negative for any significant edema or erythema  Neurological: Light touch and protective threshold grossly intact  Musculoskeletal Exam: No pedal deformities noted.  There is some pain on palpation throughout the left lateral midfoot.  ROM WNL.  No pain with ROM.  Muscle strength 5/5  all compartments  Radiographic Exam:  Normal osseous mineralization. Joint spaces preserved. No fracture/dislocation/boney destruction.    Assessment: 1.  Capsulitis left foot   Plan of Care:  1. Patient evaluated. X-Rays reviewed.  2.  Injection of 0.5 cc Celestone Soluspan injected around the left midfoot  3.  Continue wearing good supportive shoes and sneakers 4.  No NSAIDs due to history of GERD  5.  Return to clinic as needed      Edrick Kins, DPM [facility]  Dr. Edrick Kins, DPM    [address], [place] [address]                Office [phone]  Fax [phone]

## 2021-12-03 DIAGNOSIS — M3501 Sicca syndrome with keratoconjunctivitis: Secondary | ICD-10-CM | POA: Diagnosis not present

## 2021-12-22 DIAGNOSIS — K219 Gastro-esophageal reflux disease without esophagitis: Secondary | ICD-10-CM | POA: Diagnosis not present

## 2021-12-22 DIAGNOSIS — R82998 Other abnormal findings in urine: Secondary | ICD-10-CM | POA: Diagnosis not present

## 2021-12-22 DIAGNOSIS — N1831 Chronic kidney disease, stage 3a: Secondary | ICD-10-CM | POA: Diagnosis not present

## 2021-12-22 DIAGNOSIS — F32A Depression, unspecified: Secondary | ICD-10-CM | POA: Diagnosis not present

## 2021-12-22 DIAGNOSIS — F419 Anxiety disorder, unspecified: Secondary | ICD-10-CM | POA: Diagnosis not present

## 2021-12-22 DIAGNOSIS — N281 Cyst of kidney, acquired: Secondary | ICD-10-CM | POA: Diagnosis not present

## 2021-12-29 ENCOUNTER — Ambulatory Visit: Payer: PPO | Admitting: Dermatology

## 2021-12-29 DIAGNOSIS — Z79899 Other long term (current) drug therapy: Secondary | ICD-10-CM | POA: Diagnosis not present

## 2021-12-29 DIAGNOSIS — L2081 Atopic neurodermatitis: Secondary | ICD-10-CM | POA: Diagnosis not present

## 2021-12-29 DIAGNOSIS — L739 Follicular disorder, unspecified: Secondary | ICD-10-CM

## 2021-12-29 MED ORDER — CICLOPIROX 1 % EX SHAM: 1.0000 | MEDICATED_SHAMPOO | CUTANEOUS | 6 refills | Status: DC

## 2021-12-29 MED ORDER — CLINDAMYCIN PHOSPHATE 1 % EX SOLN: Freq: Every day | CUTANEOUS | 6 refills | Status: AC

## 2021-12-29 NOTE — Progress Notes (Signed)
Follow-Up Visit   Subjective  Edward Rodriguez is a 81 y.o. male who presents for the following: Eczema (Trunk, extremities, 32mf/u, Dupixent sq injections q 2 wks, Clobetasol/Cerave mix prn, ) and folliculitis (Scalp, not using Ketoconazole shampoo, not using Clobetasol sol).   The following portions of the chart were reviewed this encounter and updated as appropriate:       Review of Systems:  No other skin or systemic complaints except as noted in HPI or Assessment and Plan.  Objective  Well appearing patient in no apparent distress; mood and affect are within normal limits.  A focused examination was performed including trunk, scalp. Relevant physical exam findings are noted in the Assessment and Plan.  trunk, extremities Hyperpigmented macules and few pink excoriations flanks  Scalp Multiple crusted paps scalp    Assessment & Plan  Atopic neurodermatitis trunk, extremities  Chronic condition with duration or expected duration over one year. Currently well-controlled on Dupixent.  Atopic dermatitis - Severe, on Dupixent (biologic medication).  Atopic dermatitis (eczema) is a chronic, relapsing, pruritic condition that can significantly affect quality of life. It is often associated with allergic rhinitis and/or asthma and can require treatment with topical medications, phototherapy, or in severe cases a biologic medication called Dupixent, which requires long term medication management.    Pt having dry eyes, and has started otc product his eye doctor recommened  Cont Dupixent '300mg'$ /230msq injections q 2wks Cont Clobetasol/Cerave mix qd prn flares  Dupilumab (Dupixent) is a treatment given by injection for adults and children with moderate-to-severe atopic dermatitis. Goal is control of skin condition, not cure. It is given as 2 injections at the first dose followed by 1 injection ever 2 weeks thereafter.  Young children are dosed monthly.  Potential side effects include  allergic reaction, herpes infections, injection site reactions and conjunctivitis (inflammation of the eyes).  The use of Dupixent requires long term medication management, including periodic office visits.   Topical steroids (such as triamcinolone, fluocinolone, fluocinonide, mometasone, clobetasol, halobetasol, betamethasone, hydrocortisone) can cause thinning and lightening of the skin if they are used for too long in the same area. Your physician has selected the right strength medicine for your problem and area affected on the body. Please use your medication only as directed by your physician to prevent side effects.     Related Medications hydrOXYzine (ATARAX) 10 MG tablet Take 1-2 tablets by mouth every evening as needed for itch.  clobetasol (TEMOVATE) 0.05 % external solution Patient to mix clobetasol solution in jar of CeraVe Cream. Apply to affected areas rash twice daily until improved. Avoid face, groin, axilla.  dupilumab (DUPIXENT) 300 MG/2ML prefilled syringe Inject 300 mg into the skin every 14 (fourteen) days. For maintenance.  Folliculitis Scalp  Chronic and persistent condition with duration or expected duration over one year. Condition is symptomatic / bothersome to patient. Not to goal.   Avoid picking  Start Ciclopirox shampoo q shampoo, let sit 5 minutes and rinse out Cont Clobetasol sol qd/bid up to 5d/wk aa scalp prn flares Start Clindamycin sol qd to aa scalp  Discussed oral abx (doxy) if not improving  Topical steroids (such as triamcinolone, fluocinolone, fluocinonide, mometasone, clobetasol, halobetasol, betamethasone, hydrocortisone) can cause thinning and lightening of the skin if they are used for too long in the same area. Your physician has selected the right strength medicine for your problem and area affected on the body. Please use your medication only as directed by your physician to prevent  side effects.    Ciclopirox 1 % shampoo - Scalp Apply 1  Application topically as directed. Wash scalp q shampoo, let sit 5 minutes and rinse out  clindamycin (CLEOCIN T) 1 % external solution - Scalp Apply topically daily. Qd to aa scalp for bumps   Return in about 6 months (around 06/29/2022) for Atopic Derm, Folliculitis, AK f/u.  I, Othelia Pulling, RMA, am acting as scribe for Brendolyn Patty, MD .  Documentation: I have reviewed the above documentation for accuracy and completeness, and I agree with the above.  Brendolyn Patty MD

## 2021-12-29 NOTE — Patient Instructions (Addendum)
Folliculitis scalp Wash scalp with Ciclopirox shampoo and let sit 5 minutes and rinse out Start Clobetasol solution daily up to 5 days a week as needed to bumps on scalp Start Clindamycin solution once a day to scalp for bumps    Due to recent changes in healthcare laws, you may see results of your pathology and/or laboratory studies on MyChart before the doctors have had a chance to review them. We understand that in some cases there may be results that are confusing or concerning to you. Please understand that not all results are received at the same time and often the doctors may need to interpret multiple results in order to provide you with the best plan of care or course of treatment. Therefore, we ask that you please give [place] 2 business days to thoroughly review all your results before contacting the office for clarification. Should we see a critical lab result, you will be contacted sooner.   If You Need Anything After Your Visit  If you have any questions or concerns for your doctor, please call our main line at [phone] and press option 4 to reach your doctor's medical assistant. If no one answers, please leave a voicemail as directed and we will return your call as soon as possible. Messages left after 4 pm will be answered the following business day.   You may also send [place] a message via [place]. We typically respond to MyChart messages within 1-2 business days.  For prescription refills, please ask your pharmacy to contact our office. Our fax number is [phone].  If you have an urgent issue when the clinic is closed that cannot wait until the next business day, you can page your doctor at the number below.    Please note that while we do our best to be available for urgent issues outside of office hours, we are not available 24/7.   If you have an urgent issue and are unable to reach [place], you may choose to seek medical care at your doctor's office, retail clinic, urgent care  center, or emergency room.  If you have a medical emergency, please immediately call 911 or go to the emergency department.  Pager Numbers  - Dr. Nehemiah Massed: [phone]  - Dr. Laurence Ferrari: [phone]  - Dr. Nicole Kindred: [phone]  In the event of inclement weather, please call our main line at [phone] for an update on the status of any delays or closures.  Dermatology Medication Tips: Please keep the boxes that topical medications come in in order to help keep track of the instructions about where and how to use these. Pharmacies typically print the medication instructions only on the boxes and not directly on the medication tubes.   If your medication is too expensive, please contact our office at [phone] option 4 or send [place] a message through [place].   We are unable to tell what your co-pay for medications will be in advance as this is different depending on your insurance coverage. However, we may be able to find a substitute medication at lower cost or fill out paperwork to get insurance to cover a needed medication.   If a prior authorization is required to get your medication covered by your insurance company, please allow [place] 1-2 business days to complete this process.  Drug prices often vary depending on where the prescription is filled and some pharmacies may offer cheaper prices.  The website www.goodrx.com contains coupons for medications through different pharmacies. The prices here do not account  for what the cost may be with help from insurance (it may be cheaper with your insurance), but the website can give you the price if you did not use any insurance.  - You can print the associated coupon and take it with your prescription to the pharmacy.  - You may also stop by our office during regular business hours and pick up a GoodRx coupon card.  - If you need your prescription sent electronically to a different pharmacy, notify our office through [facility] or by  phone at [phone] option 4.     Si Usted Necesita Algo Despus de Su Visita  Tambin puede enviarnos un mensaje a travs de Pharmacist, community. Por lo general respondemos a los mensajes de MyChart en el transcurso de 1 a 2 das hbiles.  Para renovar recetas, por favor pida a su farmacia que se ponga en contacto con nuestra oficina. Harland Dingwall de fax es [place] [phone].  Si tiene un asunto urgente cuando la clnica est cerrada y que no puede esperar hasta el siguiente da hbil, puede llamar/localizar a su doctor(a) al nmero que aparece a continuacin.   Por favor, tenga en cuenta que aunque hacemos todo lo posible para estar disponibles para asuntos urgentes fuera del horario de [place], no estamos disponibles las 24 horas del da, los 7 das de la [place].   Si tiene un problema urgente y no puede comunicarse con nosotros, puede optar por buscar atencin mdica  en el consultorio de su doctor(a), en una clnica privada, en un centro de atencin urgente o en una sala de emergencias.  Si tiene Engineering geologist, por favor llame inmediatamente al 911 o vaya a la sala de emergencias.  Nmeros de bper  - Dr. Nehemiah Massed: [phone]  - Dra. Moye: [phone]  - Dra. Nicole Kindred: [phone]  En caso de inclemencias del [place], por favor llame a Johnsie Kindred principal al [phone] para una actualizacin sobre el [place] de cualquier retraso o cierre.  Consejos para la medicacin en dermatologa: Por favor, guarde las cajas en las que vienen los medicamentos de uso tpico para ayudarle a seguir las instrucciones sobre dnde y cmo usarlos. Las farmacias generalmente imprimen las instrucciones del medicamento slo en las cajas y no directamente en los tubos del [place].   Si su medicamento es muy caro, por favor, pngase en contacto con Zigmund Daniel llamando al [phone] y presione la opcin 4 o envenos un mensaje a travs de Pharmacist, community.   No podemos decirle cul ser su copago  por los medicamentos por adelantado ya que esto es diferente dependiendo de la cobertura de su seguro. Sin embargo, es posible que podamos encontrar un medicamento sustituto a Electrical engineer un formulario para que el seguro cubra el medicamento que se considera necesario.   Si se requiere una autorizacin previa para que su compaa de seguros [place] su medicamento, por favor permtanos de 1 a 2 das hbiles para completar este proceso.  Los precios de los medicamentos varan con frecuencia dependiendo del Environmental consultant de dnde se surte la receta y alguna farmacias pueden ofrecer precios ms baratos.  El sitio web www.goodrx.com tiene cupones para medicamentos de Airline pilot. Los precios aqu no tienen en cuenta lo que podra costar con la ayuda del seguro (puede ser ms barato con su seguro), pero el sitio web puede darle el precio si no utiliz Research scientist (physical sciences).  - Puede imprimir el cupn correspondiente y llevarlo con su receta a la farmacia.  Lavera Guise  puede pasar por nuestra oficina durante el horario de atencin regular y Charity fundraiser una tarjeta de cupones de GoodRx.  - Si necesita que su receta se enve electrnicamente a una farmacia diferente, informe a nuestra oficina a travs de MyChart de [place] o por telfono llamando al [phone] y presione la opcin 4.

## 2021-12-31 DIAGNOSIS — K219 Gastro-esophageal reflux disease without esophagitis: Secondary | ICD-10-CM | POA: Diagnosis not present

## 2021-12-31 DIAGNOSIS — N281 Cyst of kidney, acquired: Secondary | ICD-10-CM | POA: Diagnosis not present

## 2021-12-31 DIAGNOSIS — F32A Depression, unspecified: Secondary | ICD-10-CM | POA: Diagnosis not present

## 2021-12-31 DIAGNOSIS — N1831 Chronic kidney disease, stage 3a: Secondary | ICD-10-CM | POA: Diagnosis not present

## 2021-12-31 DIAGNOSIS — F419 Anxiety disorder, unspecified: Secondary | ICD-10-CM | POA: Diagnosis not present

## 2022-01-17 DIAGNOSIS — M25512 Pain in left shoulder: Secondary | ICD-10-CM | POA: Diagnosis not present

## 2022-01-19 ENCOUNTER — Other Ambulatory Visit: Payer: Self-pay

## 2022-01-19 DIAGNOSIS — L2081 Atopic neurodermatitis: Secondary | ICD-10-CM

## 2022-01-19 MED ORDER — DUPIXENT 300 MG/2ML ~~LOC~~ SOSY: 300.0000 mg | PREFILLED_SYRINGE | SUBCUTANEOUS | 1 refills | Status: DC

## 2022-01-19 NOTE — Progress Notes (Signed)
Dupixent Rfs to [employer]. aw

## 2022-02-15 ENCOUNTER — Other Ambulatory Visit: Payer: Self-pay | Admitting: Family Medicine

## 2022-02-15 DIAGNOSIS — F419 Anxiety disorder, unspecified: Secondary | ICD-10-CM

## 2022-03-30 ENCOUNTER — Ambulatory Visit: Payer: Self-pay | Admitting: *Deleted

## 2022-03-30 NOTE — Telephone Encounter (Signed)
Reason for Disposition  [1] Numbness or tingling in one or both hands AND [2] is a chronic symptom (recurrent or ongoing AND present > 4 weeks)  Answer Assessment - Initial Assessment Questions 1. SYMPTOM: "What is the main symptom you are concerned about?" (e.g., weakness, numbness)     Discoloration, numbness- R hand- middle finger 2. ONSET: "When did this start?" (minutes, hours, days; while sleeping)     2 weeks ago 3. LAST NORMAL: "When was the last time you (the patient) were normal (no symptoms)?"     2 weeks ago 4. PATTERN "Does this come and go, or has it been constant since it started?"  "Is it present now?"     Comes and goes- turns purple , numbness 5. CARDIAC SYMPTOMS: "Have you had any of the following symptoms: chest pain, difficulty breathing, palpitations?"     no 6. NEUROLOGIC SYMPTOMS: "Have you had any of the following symptoms: headache, dizziness, vision loss, double vision, changes in speech, unsteady on your feet?"     Numbness- tip of finger  Protocols used: Neurologic Deficit-A-AH

## 2022-03-30 NOTE — Progress Notes (Unsigned)
I,Adrick Kestler R Hoorain Kozakiewicz,acting as a Education administrator for Gwyneth Sprout, FNP.,have documented all relevant documentation on the behalf of Gwyneth Sprout, FNP,as directed by  Gwyneth Sprout, FNP while in the presence of Gwyneth Sprout, FNP.  Established patient visit  Patient: Edward Rodriguez   DOB: Sep 20, 1940   82 y.o. Male  MRN: [id number] Visit Date: 03/31/2022  Today's healthcare provider: Gwyneth Sprout, FNP  Re Introduced to nurse practitioner role and practice setting.  All questions answered.  Discussed provider/patient relationship and expectations.  Chief Complaint  Patient presents with   Hand Pain   Numbness   Subjective    HPI  Skin Discoloration/ numbness:  The discoloration began 3 week ago. It is located on his right ring finger, and consists of purple tint on the tip of his finger. It has been associated with numbness.  Patient states that the numbness and discoloration has been coming and going over the past couple of weeks, as of today he is having no symptoms.  Patient is concerned about blood flow to his extremities..   Medications: Outpatient Medications Prior to Visit  Medication Sig   dupilumab (DUPIXENT) 300 MG/2ML prefilled syringe Inject 300 mg into the skin every 14 (fourteen) days. For maintenance.   meloxicam (MOBIC) 15 MG tablet Take 15 mg by mouth daily.    omeprazole (PRILOSEC) 20 MG capsule Take 40 mg by mouth every other day.   sildenafil (REVATIO) 20 MG tablet TAKE TWO TO FIVE TABLETS BY MOUTH ONCE DAILY AS NEEDED FOR DIFFICULTY OBTAINING/MAINTAINING ERECTION   venlafaxine XR (EFFEXOR-XR) 150 MG 24 hr capsule TAKE 2 CAPSULES BY MOUTH ONCE DAILY WITH BREAKFAST   albuterol (VENTOLIN HFA) 108 (90 Base) MCG/ACT inhaler Inhale 2 puffs into the lungs every 6 (six) hours as needed for wheezing or shortness of breath. (Patient not taking: Reported on 03/31/2022)   Ciclopirox 1 % shampoo Apply 1 Application topically as directed. Wash scalp q shampoo, let sit 5 minutes and rinse  out (Patient not taking: Reported on 03/31/2022)   clindamycin (CLEOCIN T) 1 % external solution Apply topically daily. Qd to aa scalp for bumps (Patient not taking: Reported on 03/31/2022)   clobetasol (TEMOVATE) 0.05 % external solution Patient to mix clobetasol solution in jar of CeraVe Cream. Apply to affected areas rash twice daily until improved. Avoid face, groin, axilla. (Patient not taking: Reported on 03/31/2022)   FLUZONE HIGH-DOSE QUADRIVALENT 0.7 ML SUSY  (Patient not taking: Reported on 03/31/2022)   Hydrocortisone Ace-Pramoxine 1.85-1.15 % CREA Apply 1 Application topically as needed. (Patient not taking: Reported on 03/31/2022)   hydrOXYzine (ATARAX) 10 MG tablet Take 1-2 tablets by mouth every evening as needed for itch. (Patient not taking: Reported on 03/31/2022)   PFIZER COVID-19 VAC BIVALENT injection  (Patient not taking: Reported on 03/31/2022)   rosuvastatin (CRESTOR) 5 MG tablet Take 1 tablet (5 mg total) by mouth daily. (Patient not taking: Reported on 03/31/2022)   SSD 1 % cream APPLY TOPICALLY DAILY (Patient not taking: Reported on 03/31/2022)   triamcinolone (KENALOG) 0.1 % APPLY  CREAM EXTERNALLY TO AFFECTED AREA TWICE DAILY RASH  OR  ITCHING AS NEEDED.  AVOID  FACE,  GROIN  AND  AXILLA. (Patient not taking: Reported on 03/31/2022)   No facility-administered medications prior to visit.   Review of Systems    Objective    BP 129/89 (BP Location: Right Arm, Patient Position: Sitting, Cuff Size: Normal)   Pulse 82   Temp (!) 97.5  F (36.4 C) (Oral)   Wt 189 lb 8 oz (86 kg)   SpO2 100%   BMI 25.00 kg/m   Physical Exam Vitals and nursing note reviewed.  Constitutional:      Appearance: Normal appearance. He is normal weight.  HENT:     Head: Normocephalic and atraumatic.  Eyes:     Pupils: Pupils are equal, round, and reactive to light.  Cardiovascular:     Rate and Rhythm: Normal rate and regular rhythm.     Pulses: Normal pulses.     Heart sounds: Normal heart  sounds.  Pulmonary:     Effort: Pulmonary effort is normal.     Breath sounds: Normal breath sounds.  Musculoskeletal:        General: Normal range of motion.     Cervical back: Normal range of motion.  Skin:    General: Skin is cool and dry.     Capillary Refill: Capillary refill takes 2 to 3 seconds.  Neurological:     General: No focal deficit present.     Mental Status: He is alert and oriented to person, place, and time. Mental status is at baseline.     Sensory: No sensory deficit.     Motor: No weakness.     Gait: Gait normal.  Psychiatric:        Mood and Affect: Mood normal.        Behavior: Behavior normal.        Thought Content: Thought content normal.        Judgment: Judgment normal.     No results found for any visits on 03/31/22.  Assessment & Plan     Problem List Items Addressed This Visit       Cardiovascular and Mediastinum   Atherosclerotic PVD with intermittent claudication (HCC)    Chronic, variable presentation Notes poor balance in feet; however, denies numbness and tingling Now noting distal perfusion issues in hands starting at nailbed and working in. Not consistent. Denies pain or injury surround events. Continue Crestor 5 mg      Relevant Medications   amLODipine (NORVASC) 5 MG tablet     Genitourinary   Stage 3a chronic kidney disease (HCC)    Chronic, improved Continues to work with nephrologist Monitor use of nephrotoxic agents including Mobic 15 mg        Other   Poor balance - Primary    Acute, variable in last year Denies numbness or tingling; believes he is poorly lifting his feet Declines referral to neurology at this time       Raynaud's phenomenon without gangrene    Acute, variable Trial of Norvasc 5 mg x30 r 1.  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     Vonna Kotyk, FNP, have reviewed all documentation for this visit. The documentation on 03/31/22 for the exam, diagnosis, procedures, and orders are all accurate and complete.  Gwyneth Sprout, [place] [phone] (phone) [phone] (fax)  [place]

## 2022-03-30 NOTE — Telephone Encounter (Signed)
  Chief Complaint: discoloration of tip of middle finger Symptoms: purple discoloration and numbness- comes and goes Frequency: 2 weeks- comes and goes Pertinent Negatives: Patient denies pain Disposition: []$ ED /[]$ Urgent Care (no appt availability in office) / [x]$ Appointment(In office/virtual)/ []$  [place] [facility]/ []$ Home Care/ []$ Refused Recommended Disposition /[]$ [place] [facility]/ []$  Follow-up with PCP Additional Notes: Patient thinks he has circulation problem in his finger

## 2022-03-31 ENCOUNTER — Ambulatory Visit (INDEPENDENT_AMBULATORY_CARE_PROVIDER_SITE_OTHER): Payer: PPO | Admitting: Family Medicine

## 2022-03-31 ENCOUNTER — Encounter: Payer: Self-pay | Admitting: Family Medicine

## 2022-03-31 VITALS — BP 129/89 | HR 82 | Temp 97.5°F | Wt 189.5 lb

## 2022-03-31 DIAGNOSIS — D692 Other nonthrombocytopenic purpura: Secondary | ICD-10-CM | POA: Diagnosis not present

## 2022-03-31 DIAGNOSIS — R2689 Other abnormalities of gait and mobility: Secondary | ICD-10-CM | POA: Diagnosis not present

## 2022-03-31 DIAGNOSIS — I7300 Raynaud's syndrome without gangrene: Secondary | ICD-10-CM

## 2022-03-31 DIAGNOSIS — N1831 Chronic kidney disease, stage 3a: Secondary | ICD-10-CM

## 2022-03-31 DIAGNOSIS — I70219 Atherosclerosis of native arteries of extremities with intermittent claudication, unspecified extremity: Secondary | ICD-10-CM | POA: Diagnosis not present

## 2022-03-31 MED ORDER — AMLODIPINE BESYLATE 5 MG PO TABS: 5.0000 mg | ORAL_TABLET | Freq: Every day | ORAL | 1 refills | Status: DC

## 2022-03-31 NOTE — Assessment & Plan Note (Signed)
Acute, variable in last year Denies numbness or tingling; believes he is poorly lifting his feet Declines referral to neurology at this time

## 2022-03-31 NOTE — Assessment & Plan Note (Signed)
Chronic, improved Continues to work with nephrologist Monitor use of nephrotoxic agents including Mobic 15 mg

## 2022-03-31 NOTE — Assessment & Plan Note (Signed)
Chronic, stable Continues to report loss of SQ fat with concern for bleeding with the slightest bump of skin Continue to monitor

## 2022-03-31 NOTE — Assessment & Plan Note (Signed)
Acute, variable Trial of Norvasc 5 mg x30 r 1.  +3 cap refill noted; slightly cool extremities

## 2022-03-31 NOTE — Assessment & Plan Note (Signed)
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 Not consistent. Denies pain or injury surround events. Continue Crestor 5 mg

## 2022-04-01 ENCOUNTER — Other Ambulatory Visit: Payer: Self-pay | Admitting: Family Medicine

## 2022-04-01 DIAGNOSIS — N529 Male erectile dysfunction, unspecified: Secondary | ICD-10-CM

## 2022-04-01 NOTE — Telephone Encounter (Signed)
Requested Prescriptions  Pending Prescriptions Disp Refills   sildenafil (REVATIO) 20 MG tablet [Pharmacy Med Name: Sildenafil Citrate 20 MG Oral Tablet] 50 tablet 0    Sig: TAKE 2 -5 TABLETS BY MOUTH ONCE DAILY AS NEEDED FOR DIFFICULTY OBTAINING/MAINTAINING ERECTION     Urology: Erectile Dysfunction Agents Passed - 04/01/2022  5:34 PM      Passed - AST in normal range and within 360 days    AST  Date Value Ref Range Status  07/10/2021 18 0 - 40 IU/L Final         Passed - ALT in normal range and within 360 days    ALT  Date Value Ref Range Status  07/10/2021 15 0 - 44 IU/L Final         Passed - Last BP in normal range    BP Readings from Last 1 Encounters:  03/31/22 129/89         Passed - Valid encounter within last 12 months    Recent Outpatient Visits           Yesterday Poor balance   [place] Tally Joe T, FNP   6 months ago Stage 3a chronic kidney disease [facility])   [place] Tally Joe T, FNP   8 months ago Stage 3a chronic kidney disease [facility])   [place] Gwyneth Sprout, FNP   8 months ago Annual physical exam   [facility] Tally Joe T, FNP   1 year ago SOB (shortness of breath)   [place] Gwyneth Sprout, FNP       Future Appointments             In 3 months Brendolyn Patty, MD [place]

## 2022-04-20 ENCOUNTER — Other Ambulatory Visit: Payer: Self-pay

## 2022-04-20 DIAGNOSIS — L2081 Atopic neurodermatitis: Secondary | ICD-10-CM

## 2022-04-20 MED ORDER — CLOBETASOL PROPIONATE 0.05 % EX SOLN: CUTANEOUS | 0 refills | Status: DC

## 2022-04-20 NOTE — Progress Notes (Signed)
Prescription RF request. aw

## 2022-04-24 DIAGNOSIS — M7522 Bicipital tendinitis, left shoulder: Secondary | ICD-10-CM | POA: Diagnosis not present

## 2022-04-26 ENCOUNTER — Other Ambulatory Visit: Payer: Self-pay | Admitting: Family Medicine

## 2022-04-26 DIAGNOSIS — R0602 Shortness of breath: Secondary | ICD-10-CM

## 2022-04-28 ENCOUNTER — Ambulatory Visit: Payer: PPO | Admitting: Podiatry

## 2022-04-28 ENCOUNTER — Ambulatory Visit: Payer: PPO

## 2022-04-28 ENCOUNTER — Encounter: Payer: Self-pay | Admitting: Podiatry

## 2022-04-28 VITALS — BP 134/77 | HR 84

## 2022-04-28 DIAGNOSIS — M7752 Other enthesopathy of left foot: Secondary | ICD-10-CM | POA: Diagnosis not present

## 2022-04-28 MED ORDER — BETAMETHASONE SOD PHOS & ACET 6 (3-3) MG/ML IJ SUSP
3.0000 mg | Freq: Once | INTRAMUSCULAR | Status: AC
  Administered 2022-04-28: 3 mg via INTRA_ARTICULAR

## 2022-04-28 NOTE — Progress Notes (Signed)
   Chief Complaint  Patient presents with   Foot Pain    "My foot hurts below the little toe." N - pain under little toe L - 5th met left D - 1 yr O - got worse C - throbs, hot while sitting A - walking, barefoot T - he gave me an injection in it before    HPI: 82 y.o. male presenting today for evaluation of pain and tenderness associated to the left fifth toe joint.  Patient has history of pain to the lateral aspect of the left foot.  Patient states that now his pain is localized around the fifth toe joint.  Denies a history of injury.  He has not done anything for treatment.  He did purchase a pair of sketchers shoes to help alleviate the pain.  Past Medical History:  Diagnosis Date   Actinic keratosis 07/26/2007   R dorsum index finger - bx proven    Actinic keratosis 01/25/2008   L inf helix - bx proven    Actinic keratosis 05/16/2009   L lat canthus - bx proven    Anxiety    GERD (gastroesophageal reflux disease)    Vertigo 06/2014   had an episode in 2013.    Past Surgical History:  Procedure Laterality Date   ROTATOR CUFF REPAIR Left 06/2014   SHOULDER ARTHROSCOPY Left 06/27/2014   Procedure: ARTHROSCOPY SHOULDER- mini open rotator cuff repair;  Surgeon: Thornton Park, MD;  Location: [facility] ORS;  Service: Orthopedics;  Laterality: Left;   SHOULDER OPEN ROTATOR CUFF REPAIR Right 2011    Allergies  Allergen Reactions   Sulfa Antibiotics    Elemental Sulfur Rash     Physical Exam: General: The patient is alert and oriented x3 in no acute distress.  Dermatology: Skin is warm, dry and supple bilateral lower extremities. Negative for open lesions or macerations.  Vascular: Palpable pedal pulses bilaterally. Capillary refill within normal limits.  Negative for any significant edema or erythema  Neurological: Light touch and protective threshold grossly intact  Musculoskeletal Exam: Hammertoe contracture deformity noted creating increased plantarflexion and pressure  to the metatarsal heads with weightbearing.  There is pain with palpation range of motion of the fifth MTP left foot   Assessment: 1.  Fifth MTP capsulitis left  -Patient evaluated -Injection of 0.5 cc Celestone Soluspan injected fifth MTP left -Offloading felt dancers pad was applied to the insole of the patient's shoe to offload pressure from the fifth MTP -Advised against going barefoot.  Continue wearing good supportive shoes and sneakers -No NSAIDs due to history of GERD -Return to clinic as needed   Edrick Kins, DPM [facility]  Dr. Edrick Kins, DPM    [address], [place] [address]                Office [phone]  Fax [phone]

## 2022-05-12 ENCOUNTER — Other Ambulatory Visit: Payer: Self-pay | Admitting: Family Medicine

## 2022-05-12 DIAGNOSIS — R2689 Other abnormalities of gait and mobility: Secondary | ICD-10-CM

## 2022-05-12 NOTE — Assessment & Plan Note (Signed)
Falls x2 while hiking Denies major injuries Self treated abrasions to hands and arms Treated with topical agents and xeroform Encouraged vit e based moisturizer and caution  

## 2022-05-12 NOTE — Assessment & Plan Note (Signed)
Acute, variable in last year Denies numbness or tingling; believes he is poorly lifting his feet Declines referral to neurology at this time  

## 2022-05-14 ENCOUNTER — Other Ambulatory Visit: Payer: Self-pay | Admitting: Family Medicine

## 2022-05-14 DIAGNOSIS — F419 Anxiety disorder, unspecified: Secondary | ICD-10-CM

## 2022-05-14 NOTE — Telephone Encounter (Signed)
Requested Prescriptions  Pending Prescriptions Disp Refills   venlafaxine XR (EFFEXOR-XR) 150 MG 24 hr capsule [Pharmacy Med Name: Venlafaxine HCl ER 150 MG Oral Capsule Extended Release 24 Hour] 180 capsule 0    Sig: TAKE 2 CAPSULES BY MOUTH ONCE DAILY WITH BREAKFAST     Psychiatry: Antidepressants - SNRI - desvenlafaxine & venlafaxine Failed - 05/14/2022  9:26 AM      Failed - Cr in normal range and within 360 days    Creatinine, Ser  Date Value Ref Range Status  07/10/2021 1.42 (H) 0.76 - 1.27 mg/dL Final         Failed - Lipid Panel in normal range within the last 12 months    Cholesterol, Total  Date Value Ref Range Status  07/10/2021 175 100 - 199 mg/dL Final   LDL Chol Calc (NIH)  Date Value Ref Range Status  07/10/2021 97 0 - 99 mg/dL Final   HDL  Date Value Ref Range Status  07/10/2021 43 >39 mg/dL Final   Triglycerides  Date Value Ref Range Status  07/10/2021 203 (H) 0 - 149 mg/dL Final         Passed - Completed PHQ-2 or PHQ-9 in the last 360 days      Passed - Last BP in normal range    BP Readings from Last 1 Encounters:  04/28/22 134/77         Passed - Valid encounter within last 6 months    Recent Outpatient Visits           1 month ago Poor balance   [place] Tally Joe T, FNP   7 months ago Stage 3a chronic kidney disease [facility])   [place] Tally Joe T, FNP   9 months ago Stage 3a chronic kidney disease [facility])   [place] Gwyneth Sprout, FNP   10 months ago Annual physical exam   [facility] Tally Joe T, FNP   1 year ago SOB (shortness of breath)   [place] Gwyneth Sprout, FNP       Future Appointments             In 2 months Brendolyn Patty, MD [place]

## 2022-05-15 ENCOUNTER — Other Ambulatory Visit: Payer: Self-pay | Admitting: Family Medicine

## 2022-05-15 DIAGNOSIS — R2689 Other abnormalities of gait and mobility: Secondary | ICD-10-CM

## 2022-05-15 DIAGNOSIS — Z87898 Personal history of other specified conditions: Secondary | ICD-10-CM | POA: Insufficient documentation

## 2022-05-18 NOTE — Progress Notes (Unsigned)
I,[name],acting as a scribe for [name], FNP.,have documented all relevant documentation on the behalf of Jacky Kindlelise T Payne, FNP,as directed by  [name], FNP while in the presence of [name], FNP.  Established patient visit  Patient: Edward HiltsDean Rodriguez   DOB: [birth year]   82 y.o. Male  MRN: [id number] Visit Date: 05/19/2022  Today's healthcare provider: [name], FNP  Re Introduced to nurse practitioner role and practice setting.  All questions answered.  Discussed provider/patient relationship and expectations.  Subjective    HPI  Patient wants to discuss laryngitis symptoms  Medications: Outpatient Medications Prior to Visit  Medication Sig   albuterol (VENTOLIN HFA) 108 (90 Base) MCG/ACT inhaler INHALE 2 PUFFS BY MOUTH EVERY 6 HOURS AS NEEDED FOR WHEEZING AND FOR SHORTNESS OF BREATH   Ciclopirox 1 % shampoo Apply 1 Application topically as directed. Wash scalp q shampoo, let sit 5 minutes and rinse out   clobetasol (TEMOVATE) 0.05 % external solution Patient to mix clobetasol solution in jar of CeraVe Cream. Apply to affected areas rash twice daily until improved. Avoid face, groin, axilla.   dupilumab (DUPIXENT) 300 MG/2ML prefilled syringe Inject 300 mg into the skin every 14 (fourteen) days. For maintenance.   FLUZONE HIGH-DOSE QUADRIVALENT 0.7 ML SUSY    meloxicam (MOBIC) 15 MG tablet Take 15 mg by mouth daily.    omeprazole (PRILOSEC) 20 MG capsule Take 40 mg by mouth every other day.   PFIZER COVID-19 VAC BIVALENT injection    rosuvastatin (CRESTOR) 5 MG tablet Take 1 tablet (5 mg total) by mouth daily.   SSD 1 % cream APPLY TOPICALLY DAILY   venlafaxine XR (EFFEXOR-XR) 150 MG 24 hr capsule TAKE 2 CAPSULES BY MOUTH ONCE DAILY WITH BREAKFAST   amLODipine (NORVASC) 5 MG tablet Take 1 tablet (5 mg total) by mouth daily. (Patient not taking: Reported on 05/19/2022)   clindamycin (CLEOCIN T) 1 % external solution Apply topically daily. Qd to aa scalp for bumps  (Patient not taking: Reported on 05/19/2022)   Hydrocortisone Ace-Pramoxine 1.85-1.15 % CREA Apply 1 Application topically as needed. (Patient not taking: Reported on 05/19/2022)   hydrOXYzine (ATARAX) 10 MG tablet Take 1-2 tablets by mouth every evening as needed for itch. (Patient not taking: Reported on 05/19/2022)   sildenafil (REVATIO) 20 MG tablet TAKE 2 -5 TABLETS BY MOUTH ONCE DAILY AS NEEDED FOR DIFFICULTY OBTAINING/MAINTAINING ERECTION (Patient not taking: Reported on 05/19/2022)   triamcinolone (KENALOG) 0.1 % APPLY  CREAM EXTERNALLY TO AFFECTED AREA TWICE DAILY RASH  OR  ITCHING AS NEEDED.  AVOID  FACE,  GROIN  AND  AXILLA. (Patient not taking: Reported on 05/19/2022)   No facility-administered medications prior to visit.    Review of Systems  HENT:  Positive for congestion.   Respiratory:  Positive for apnea, cough, choking, chest tightness, shortness of breath and wheezing.   Neurological:  Negative for light-headedness and headaches.     Objective    Temp 97.9 F (36.6 C) (Oral)   Resp 17   Ht 5\' 11"  (1.803 m)   Wt 187 lb 4.8 oz (85 kg)   SpO2 100%   BMI 26.12 kg/m   No data found.   Physical Exam Vitals and nursing note reviewed.  Constitutional:      Appearance: Normal appearance. He is overweight.  HENT:     Head: Normocephalic and atraumatic.     Right Ear: Tympanic membrane, ear canal and external ear normal.  Left Ear: Tympanic membrane, ear canal and external ear normal.     Nose: Congestion present.     Mouth/Throat:     Mouth: Mucous membranes are moist.     Pharynx: Oropharynx is clear. Posterior oropharyngeal erythema present.  Eyes:     Pupils: Pupils are equal, round, and reactive to light.  Cardiovascular:     Rate and Rhythm: Normal rate and regular rhythm.     Pulses: Normal pulses.     Heart sounds: Normal heart sounds.  Pulmonary:     Effort: Pulmonary effort is normal.     Breath sounds: Decreased air movement present. Decreased breath sounds,  wheezing and rhonchi present.  Musculoskeletal:        General: Normal range of motion.     Cervical back: Normal range of motion.  Skin:    General: Skin is warm and dry.     Capillary Refill: Capillary refill takes less than 2 seconds.  Neurological:     General: No focal deficit present.     Mental Status: He is alert and oriented to person, place, and time. Mental status is at baseline.     No results found for any visits on 05/19/22.  Assessment & Plan     Problem List Items Addressed This Visit       Cardiovascular and Mediastinum   Atherosclerotic PVD with intermittent claudication    Chronic, stable Continues to have complaints of burning and decreased sensation in BLE with purposeful movement, [place] Previously followed by vascular team; remains on crestor 5 mg      Relevant Medications   predniSONE (DELTASONE) 20 MG tablet     Respiratory   COPD with acute exacerbation - Primary    Acute complaints of laryngitis with association of DOE not improved with OTC medications; would recommend continued supportive care measures in addition to use of oral steroid, antibiotics and nasal spray to assist with PND and prevent further exacerbations with use of Singulair for mucus management Return to clinic as needed      Relevant Medications   amoxicillin-clavulanate (AUGMENTIN) 875-125 MG tablet   predniSONE (DELTASONE) 20 MG tablet   ipratropium (ATROVENT) 0.03 % nasal spray   montelukast (SINGULAIR) 10 MG tablet     Musculoskeletal and Integument   Other nonthrombocytopenic purpura    Chronic, stable No active/open concerns at this time Previously f/b Dr Roseanne Reno at [facility]; continues to wear sunscreen to assist with other skin lesion prevention        Genitourinary   Stage 3a chronic kidney disease (CKD)    Chronic, stable Mindful reminder of OTC use of cold/flu medications given possibility of NSAIDs Continue to monitor PO intake  of water        Other   History of dizziness    Without concern for recent falls; negative for orthostatics at this time. Continue to monitor.       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     Leilani Merl, FNP, have reviewed all documentation for this visit. The documentation on 05/21/22 for the exam, diagnosis, procedures, and orders are all accurate and complete.  Robynn Pane  Grier Mitts, FNP  [facility] [phone] (phone) [phone] (fax)  [facility]

## 2022-05-19 ENCOUNTER — Ambulatory Visit (INDEPENDENT_AMBULATORY_CARE_PROVIDER_SITE_OTHER): Payer: PPO | Admitting: Family Medicine

## 2022-05-19 ENCOUNTER — Telehealth: Payer: Self-pay

## 2022-05-19 VITALS — Temp 97.9°F | Resp 17 | Ht 71.0 in | Wt 187.3 lb

## 2022-05-19 DIAGNOSIS — Z87898 Personal history of other specified conditions: Secondary | ICD-10-CM

## 2022-05-19 DIAGNOSIS — I70219 Atherosclerosis of native arteries of extremities with intermittent claudication, unspecified extremity: Secondary | ICD-10-CM

## 2022-05-19 DIAGNOSIS — N1831 Chronic kidney disease, stage 3a: Secondary | ICD-10-CM

## 2022-05-19 DIAGNOSIS — R2689 Other abnormalities of gait and mobility: Secondary | ICD-10-CM | POA: Diagnosis not present

## 2022-05-19 DIAGNOSIS — J441 Chronic obstructive pulmonary disease with (acute) exacerbation: Secondary | ICD-10-CM | POA: Diagnosis not present

## 2022-05-19 DIAGNOSIS — D692 Other nonthrombocytopenic purpura: Secondary | ICD-10-CM | POA: Diagnosis not present

## 2022-05-19 DIAGNOSIS — J209 Acute bronchitis, unspecified: Secondary | ICD-10-CM | POA: Insufficient documentation

## 2022-05-19 MED ORDER — AMOXICILLIN-POT CLAVULANATE 875-125 MG PO TABS: 1.0000 | ORAL_TABLET | Freq: Two times a day (BID) | ORAL | 0 refills | Status: DC

## 2022-05-19 MED ORDER — MONTELUKAST SODIUM 10 MG PO TABS: 10.0000 mg | ORAL_TABLET | Freq: Every day | ORAL | 3 refills | Status: DC

## 2022-05-19 MED ORDER — IPRATROPIUM BROMIDE 0.03 % NA SOLN: 2.0000 | Freq: Two times a day (BID) | NASAL | 12 refills | Status: DC

## 2022-05-19 MED ORDER — PREDNISONE 20 MG PO TABS: 20.0000 mg | ORAL_TABLET | Freq: Every day | ORAL | 0 refills | Status: DC

## 2022-05-19 NOTE — Patient Instructions (Signed)
The CDC recommends two doses of Shingrix (the new shingles vaccine) separated by 2 to 6 months for adults age 82 years and older. I recommend checking with your insurance plan regarding coverage for this vaccine.    

## 2022-05-19 NOTE — Telephone Encounter (Signed)
Copied from CRM [id number]. Topic: General - Other >> May 19, 2022  3:11 PM Everette C wrote: Reason for CRM: Charlena Cross with the [facility] would like to speak with the patient's PCP when possible regarding recently completed paperwork related to their potential employment   Please contact further when possible

## 2022-05-21 ENCOUNTER — Encounter: Payer: Self-pay | Admitting: Family Medicine

## 2022-05-21 ENCOUNTER — Other Ambulatory Visit: Payer: Self-pay | Admitting: Dermatology

## 2022-05-21 DIAGNOSIS — D692 Other nonthrombocytopenic purpura: Secondary | ICD-10-CM | POA: Insufficient documentation

## 2022-05-21 DIAGNOSIS — L2081 Atopic neurodermatitis: Secondary | ICD-10-CM

## 2022-05-21 DIAGNOSIS — N1831 Chronic kidney disease, stage 3a: Secondary | ICD-10-CM | POA: Insufficient documentation

## 2022-05-21 NOTE — Telephone Encounter (Signed)
Edward Rodriguez called for update on request, I spoke with flow coordinator and was advised that tomorrow will be the soonest Edward Rodriguez could provide correspondence. I returned to [name] from hold to find that she had me on hold. I stated hello multiple times and held for additional time to relay the message over the phone since she called. However no one ever returned to the line for several minutes.

## 2022-05-21 NOTE — Assessment & Plan Note (Signed)
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

## 2022-05-21 NOTE — Assessment & Plan Note (Signed)
Acute complaints of laryngitis with association of DOE not improved with OTC medications; would recommend continued supportive care measures in addition to use of oral steroid, antibiotics and nasal spray to assist with PND and prevent further exacerbations with use of Singulair for mucus management Return to clinic as needed

## 2022-05-21 NOTE — Assessment & Plan Note (Signed)
Chronic, stable Continues to have complaints of burning and decreased sensation in BLE with purposeful movement, [place] Previously followed by vascular team; remains on crestor 5 mg

## 2022-05-21 NOTE — Assessment & Plan Note (Signed)
Without concern for recent falls; negative for orthostatics at this time. Continue to monitor.

## 2022-05-21 NOTE — Assessment & Plan Note (Signed)
Chronic, stable No active/open concerns at this time Previously f/b Dr Roseanne Reno at [facility]; continues to wear sunscreen to assist with other skin lesion prevention

## 2022-05-21 NOTE — Assessment & Plan Note (Signed)
Chronic, stable Mindful reminder of OTC use of cold/flu medications given possibility of NSAIDs Continue to monitor PO intake of water

## 2022-05-21 NOTE — Assessment & Plan Note (Signed)
>>  ASSESSMENT AND PLAN FOR STAGE 3A CHRONIC KIDNEY DISEASE (CKD) (HCC) WRITTEN ON 05/21/2022  2:53 PM BY PAYNE, ELISE T, FNP  Chronic, stable Mindful reminder of OTC use of cold/flu medications given possibility of NSAIDs Continue to monitor PO intake of water

## 2022-05-21 NOTE — Telephone Encounter (Signed)
Edward Rodriguez with the [facility] is calling to following up on this [employer] work completed 05/12/22. On this documentation is was NOT recommended for the patient to drive.   On 05/19/22 the patient returned with a letter stating that it IS recommended for the patient to drive.   Edward Rodriguez is calling to receive clarity on if the patient can drive or not. Please advise CB- [phone]

## 2022-05-25 NOTE — Telephone Encounter (Signed)
Edward Rodriguez advised and verbalized understanding. Reports he will need paperwork redone as it was marked he can not drive on forms that were signed and turned in. States she will provide forms to patient and have him bring them into the office.   FYI

## 2022-05-27 NOTE — Progress Notes (Unsigned)
I,J'ya E Ras Kollman,acting as a scribe for Jacky Kindle, FNP.,have documented all relevant documentation on the behalf of Jacky Kindle, FNP,as directed by  Jacky Kindle, FNP while in the presence of Jacky Kindle, FNP.   Established patient visit  Patient: Edward Rodriguez   DOB: 1940-10-06   82 y.o. Male  MRN: [id number] Visit Date: 05/28/2022  Today's healthcare provider: Jacky Kindle, FNP  Re Introduced to nurse practitioner role and practice setting.  All questions answered.  Discussed provider/patient relationship and expectations.  Chief Complaint  Patient presents with   Nausea   Subjective    Other This is a recurrent problem. The current episode started in the past 7 days. The problem occurs daily. The problem has been unchanged. Associated symptoms include fatigue and nausea. Pertinent negatives include no chest pain, fever or headaches. Exacerbated by: working outside. He has tried nothing for the symptoms. The treatment provided no relief.    Medications: Outpatient Medications Prior to Visit  Medication Sig   albuterol (VENTOLIN HFA) 108 (90 Base) MCG/ACT inhaler INHALE 2 PUFFS BY MOUTH EVERY 6 HOURS AS NEEDED FOR WHEEZING AND FOR SHORTNESS OF BREATH   amoxicillin-clavulanate (AUGMENTIN) 875-125 MG tablet Take 1 tablet by mouth 2 (two) times daily.   Ciclopirox 1 % shampoo Apply 1 Application topically as directed. Wash scalp q shampoo, let sit 5 minutes and rinse out   clobetasol (TEMOVATE) 0.05 % external solution MIX CLOBETASOL SOLUTION IN JAR OF CERAVE CREAM. APPLY TO AFFECTED AREAS OF RASH TWICE DAILY UNTIL IMPROVED. AVOID FACE, GROIN, AXILLA   dupilumab (DUPIXENT) 300 MG/2ML prefilled syringe Inject 300 mg into the skin every 14 (fourteen) days. For maintenance.   FLUZONE HIGH-DOSE QUADRIVALENT 0.7 ML SUSY    ipratropium (ATROVENT) 0.03 % nasal spray Place 2 sprays into both nostrils every 12 (twelve) hours.   meloxicam (MOBIC) 15 MG tablet Take 15 mg by mouth daily.     montelukast (SINGULAIR) 10 MG tablet Take 1 tablet (10 mg total) by mouth at bedtime.   PFIZER COVID-19 VAC BIVALENT injection    predniSONE (DELTASONE) 20 MG tablet Take 1 tablet (20 mg total) by mouth daily with breakfast.   rosuvastatin (CRESTOR) 5 MG tablet Take 1 tablet (5 mg total) by mouth daily.   SSD 1 % cream APPLY TOPICALLY DAILY   venlafaxine XR (EFFEXOR-XR) 150 MG 24 hr capsule TAKE 2 CAPSULES BY MOUTH ONCE DAILY WITH BREAKFAST   [DISCONTINUED] omeprazole (PRILOSEC) 20 MG capsule Take 40 mg by mouth every other day.   amLODipine (NORVASC) 5 MG tablet Take 1 tablet (5 mg total) by mouth daily. (Patient not taking: Reported on 05/19/2022)   clindamycin (CLEOCIN T) 1 % external solution Apply topically daily. Qd to aa scalp for bumps (Patient not taking: Reported on 05/19/2022)   Hydrocortisone Ace-Pramoxine 1.85-1.15 % CREA Apply 1 Application topically as needed. (Patient not taking: Reported on 05/19/2022)   hydrOXYzine (ATARAX) 10 MG tablet Take 1-2 tablets by mouth every evening as needed for itch. (Patient not taking: Reported on 05/19/2022)   sildenafil (REVATIO) 20 MG tablet TAKE 2 -5 TABLETS BY MOUTH ONCE DAILY AS NEEDED FOR DIFFICULTY OBTAINING/MAINTAINING ERECTION (Patient not taking: Reported on 05/19/2022)   triamcinolone (KENALOG) 0.1 % APPLY  CREAM EXTERNALLY TO AFFECTED AREA TWICE DAILY RASH  OR  ITCHING AS NEEDED.  AVOID  FACE,  GROIN  AND  AXILLA. (Patient not taking: Reported on 05/19/2022)   No facility-administered medications prior to visit.  Review of Systems  Constitutional:  Positive for fatigue. Negative for fever.  Cardiovascular:  Negative for chest pain.  Gastrointestinal:  Positive for nausea.  Neurological:  Negative for headaches.     Objective    BP 139/71 (BP Location: Left Arm, Patient Position: Sitting, Cuff Size: Normal)   Pulse 78   Temp 98 F (36.7 C) (Oral)   Resp 12   Ht  (1.803 m)   Wt 187 lb 9.6 oz (85.1 kg)   SpO2 100%   BMI 26.16 kg/m    Physical Exam Vitals and nursing note reviewed.  Constitutional:      Appearance: Normal appearance. He is overweight.  HENT:     Head: Normocephalic and atraumatic.  Cardiovascular:     Rate and Rhythm: Normal rate and regular rhythm.     Pulses: Normal pulses.     Heart sounds: Normal heart sounds.  Pulmonary:     Effort: Pulmonary effort is normal.     Breath sounds: Normal breath sounds.  Abdominal:     General: Bowel sounds are normal. There is no distension.     Palpations: Abdomen is soft. There is no mass.     Tenderness: There is no abdominal tenderness. There is no right CVA tenderness, left CVA tenderness, guarding or rebound.  Musculoskeletal:        General: Normal range of motion.     Cervical back: Normal range of motion.  Skin:    General: Skin is warm and dry.     Capillary Refill: Capillary refill takes less than 2 seconds.  Neurological:     General: No focal deficit present.     Mental Status: He is alert and oriented to person, place, and time. Mental status is at baseline.  Psychiatric:        Mood and Affect: Mood normal.        Behavior: Behavior normal.        Thought Content: Thought content normal.        Judgment: Judgment normal.    No results found for any visits on 05/28/22.  Assessment & Plan     Problem List Items Addressed This Visit       Cardiovascular and Mediastinum   Atherosclerotic PVD with intermittent claudication    Palpable R proximal varicose vein with noted discomfort; no pain. No change in blood flow or sensation. Continue to monitor hydration and possibility of edema       Relevant Orders   Ambulatory referral to Cardiology     Digestive   Gastroesophageal reflux disease - Primary    Chronic, recently exacerbated Restart prilosec Continue to monitor triggers/symptoms RTC in 4 weeks or sooner if necessary       Relevant Medications   omeprazole (PRILOSEC) 20 MG capsule     Musculoskeletal and Integument    Other nonthrombocytopenic purpura    Followed by dermatology Continues to use moisturizer No active lesions or ulcerations Varied discolorations and states of minor ecchymoses         Genitourinary   Stage 3a chronic kidney disease (CKD)    Chronic, stable Continue to prioritize water intake Followed by nephro         Other   DOE (dyspnea on exertion)    Acute, noted only able to do 5 mins of walking, golfing, gardening before he needs to take a break and rest. Recommend secondary opinion from cardiology given known ASCVD and PAD/PVD. Patient denies CP; however, is unable  to complete his desired tasks and is concerned about his stamina.  Defer request for testosterone at this time Continue to recommend balanced, lower carb meals. Smaller meal size, adding snacks. Choosing water as drink of choice and increasing purposeful exercise.       Relevant Orders   Ambulatory referral to Cardiology   Nausea    Acute on chronic, worsening in past week following recent treatment for COPD acute exacerbation Denies other s/s Multifocal complaints; recommend start of PPI at this time and refer to cardiology for secondary workup       Relevant Orders   Ambulatory referral to Cardiology   Return in about 4 weeks (around 06/25/2022) for gerd.     Leilani Merl, FNP, have reviewed all documentation for this visit. The documentation on 05/28/22 for the exam, diagnosis, procedures, and orders are all accurate and complete.  Jacky Kindle, FNP  [facility] [phone] (phone) [phone] (fax)  [facility]

## 2022-05-28 ENCOUNTER — Ambulatory Visit (INDEPENDENT_AMBULATORY_CARE_PROVIDER_SITE_OTHER): Payer: PPO | Admitting: Family Medicine

## 2022-05-28 ENCOUNTER — Encounter: Payer: Self-pay | Admitting: Family Medicine

## 2022-05-28 VITALS — BP 139/71 | HR 78 | Temp 98.0°F | Resp 12 | Ht 71.0 in | Wt 187.6 lb

## 2022-05-28 DIAGNOSIS — D692 Other nonthrombocytopenic purpura: Secondary | ICD-10-CM | POA: Diagnosis not present

## 2022-05-28 DIAGNOSIS — I70219 Atherosclerosis of native arteries of extremities with intermittent claudication, unspecified extremity: Secondary | ICD-10-CM

## 2022-05-28 DIAGNOSIS — R110 Nausea: Secondary | ICD-10-CM | POA: Diagnosis not present

## 2022-05-28 DIAGNOSIS — N1831 Chronic kidney disease, stage 3a: Secondary | ICD-10-CM

## 2022-05-28 DIAGNOSIS — K2100 Gastro-esophageal reflux disease with esophagitis, without bleeding: Secondary | ICD-10-CM | POA: Diagnosis not present

## 2022-05-28 DIAGNOSIS — R0609 Other forms of dyspnea: Secondary | ICD-10-CM

## 2022-05-28 MED ORDER — OMEPRAZOLE 20 MG PO CPDR: 40.0000 mg | DELAYED_RELEASE_CAPSULE | Freq: Every day | ORAL | 11 refills | Status: DC

## 2022-05-28 NOTE — Assessment & Plan Note (Signed)
Followed by dermatology Continues to use moisturizer No active lesions or ulcerations Varied discolorations and states of minor ecchymoses

## 2022-05-28 NOTE — Assessment & Plan Note (Signed)
Chronic, recently exacerbated Restart prilosec Continue to monitor triggers/symptoms RTC in 4 weeks or sooner if necessary

## 2022-05-28 NOTE — Assessment & Plan Note (Signed)
>>  ASSESSMENT AND PLAN FOR STAGE 3A CHRONIC KIDNEY DISEASE (CKD) (HCC) WRITTEN ON 05/28/2022  9:56 AM BY PAYNE, ELISE T, FNP  Chronic, stable Continue to prioritize water intake Followed by nephro

## 2022-05-28 NOTE — Assessment & Plan Note (Signed)
Acute, noted only able to do 5 mins of walking, golfing, gardening before he needs to take a break and rest. Recommend secondary opinion from cardiology given known ASCVD and PAD/PVD. Patient denies CP; however, is unable to complete his desired tasks and is concerned about his stamina.  Defer request for testosterone at this time Continue to recommend balanced, lower carb meals. Smaller meal size, adding snacks. Choosing water as drink of choice and increasing purposeful exercise.

## 2022-05-28 NOTE — Patient Instructions (Signed)
The CDC recommends two doses of Shingrix (the new shingles vaccine) separated by 2 to 6 months for adults age 82 years and older. I recommend checking with your insurance plan regarding coverage for this vaccine.    

## 2022-05-28 NOTE — Assessment & Plan Note (Signed)
Chronic, stable Continue to prioritize water intake Followed by nephro

## 2022-05-28 NOTE — Assessment & Plan Note (Signed)
Palpable R proximal varicose vein with noted discomfort; no pain. No change in blood flow or sensation. Continue to monitor hydration and possibility of edema

## 2022-05-28 NOTE — Assessment & Plan Note (Signed)
Acute on chronic, worsening in past week following recent treatment for COPD acute exacerbation Denies other s/s Multifocal complaints; recommend start of PPI at this time and refer to cardiology for secondary workup

## 2022-05-31 ENCOUNTER — Ambulatory Visit
Admission: RE | Admit: 2022-05-31 | Discharge: 2022-05-31 | Disposition: A | Discharge status: Home / Self Care | Payer: PPO | Source: Ambulatory Visit | Attending: Student | Admitting: Student

## 2022-05-31 ENCOUNTER — Other Ambulatory Visit: Payer: Self-pay | Admitting: Student

## 2022-05-31 DIAGNOSIS — M7989 Other specified soft tissue disorders: Secondary | ICD-10-CM

## 2022-05-31 DIAGNOSIS — R2241 Localized swelling, mass and lump, right lower limb: Secondary | ICD-10-CM | POA: Diagnosis not present

## 2022-05-31 DIAGNOSIS — I82611 Acute embolism and thrombosis of superficial veins of right upper extremity: Secondary | ICD-10-CM | POA: Diagnosis not present

## 2022-06-14 ENCOUNTER — Other Ambulatory Visit: Payer: Self-pay | Admitting: Family Medicine

## 2022-06-14 DIAGNOSIS — N529 Male erectile dysfunction, unspecified: Secondary | ICD-10-CM

## 2022-06-14 DIAGNOSIS — I809 Phlebitis and thrombophlebitis of unspecified site: Secondary | ICD-10-CM | POA: Insufficient documentation

## 2022-06-14 NOTE — Progress Notes (Unsigned)
MRN : [id number]  Edward Rodriguez is a 82 y.o. (06-11-1940) male who presents with chief complaint of legs hurt and swell.  History of Present Illness:   The patient presents to the office for evaluation of DVT.  DVT was identified at [facility] by Duplex ultrasound.  The initial symptoms were pain and swelling in the lower extremity.  The patient notes the leg continues to be very painful with dependency and swells quite a bite.  Symptoms are much better with elevation.  The patient notes minimal edema in the morning which steadily worsens throughout the day.    The patient has not been using compression therapy at this point.  No SOB or pleuritic chest pains.  No cough or hemoptysis.  No blood per rectum or blood in any sputum.  No excessive bruising per the patient.   No recent shortening of the patient's walking distance or new symptoms consistent with claudication.  No history of rest pain symptoms. No new ulcers or wounds of the lower extremities have occurred.  The patient denies amaurosis fugax or recent TIA symptoms. There are no recent neurological changes noted. No recent episodes of angina or shortness of breath documented.   Duplex ultrasound of the right lower extremity dated May 31, 2022 demonstrates normal deep venous system however, there is acute thrombus within the entire right great saphenous vein as well as the right small saphenous vein.  The thrombus does extend to the saphenofemoral junction.   No outpatient medications have been marked as taking for the 06/15/22 encounter (Appointment) with Gilda Crease, Latina Craver, MD.    Past Medical History:  Diagnosis Date   Actinic keratosis 07/26/2007   R dorsum index finger - bx proven    Actinic keratosis 01/25/2008   L inf helix - bx proven    Actinic keratosis 05/16/2009   L lat canthus - bx proven    Anxiety    GERD (gastroesophageal reflux disease)    Vertigo 06/2014   had an episode in 2013.    Past Surgical  History:  Procedure Laterality Date   ROTATOR CUFF REPAIR Left 06/2014   SHOULDER ARTHROSCOPY Left 06/27/2014   Procedure: ARTHROSCOPY SHOULDER- mini open rotator cuff repair;  Surgeon: Juanell Fairly, MD;  Location: [facility] ORS;  Service: Orthopedics;  Laterality: Left;   SHOULDER OPEN ROTATOR CUFF REPAIR Right 2011    Social History Social History   Tobacco Use   Smoking status: Former    Types: Cigarettes    Quit date: 06/18/1968    Years since quitting: 54.0   Smokeless tobacco: Never  Vaping Use   Vaping Use: Never used  Substance Use Topics   Alcohol use: Yes    Alcohol/week: 4.0 standard drinks of alcohol    Types: 4 Cans of beer per week    Comment: occasionally   Drug use: No    Family History Family History  Problem Relation Age of Onset   Kidney failure Mother        27s   Dementia Father    Heart Problems Sister    Cancer Maternal Grandfather    Cancer Paternal Grandmother    Cancer Paternal Grandfather     Allergies  Allergen Reactions   Sulfa Antibiotics    Elemental Sulfur Rash     REVIEW OF SYSTEMS (Negative unless checked)  Constitutional: [] Weight loss  [] Fever  [] Chills Cardiac: [] Chest pain   [] Chest pressure   [] Palpitations   []   Shortness of breath when laying flat   [] Shortness of breath with exertion. Vascular:  [] Pain in legs with walking   [x] Pain in legs at rest  [] History of DVT   [] Phlebitis   [x] Swelling in legs   [] Varicose veins   [] Non-healing ulcers Pulmonary:   [] Uses home oxygen   [] Productive cough   [] Hemoptysis   [] Wheeze  [] COPD   [] Asthma Neurologic:  [] Dizziness   [] Seizures   [] History of stroke   [] History of TIA  [] Aphasia   [] Vissual changes   [] Weakness or numbness in arm   [] Weakness or numbness in leg Musculoskeletal:   [] Joint swelling   [] Joint pain   [] Low back pain Hematologic:  [] Easy bruising  [] Easy bleeding   [] Hypercoagulable state   [] Anemic Gastrointestinal:  [] Diarrhea   [] Vomiting  [x] Gastroesophageal  reflux/heartburn   [] Difficulty swallowing. Genitourinary:  [x] Chronic kidney disease   [] Difficult urination  [] Frequent urination   [] Blood in urine Skin:  [] Rashes   [] Ulcers  Psychological:  [] History of anxiety   []  History of major depression.  Physical Examination  There were no vitals filed for this visit. There is no height or weight on file to calculate BMI. Gen: WD/WN, NAD Head: [place]/AT, No temporalis wasting.  Ear/Nose/Throat: Hearing grossly intact, nares w/o erythema or drainage, pinna without lesions Eyes: PER, EOMI, sclera nonicteric.  Neck: Supple, no gross masses.  No JVD.  Pulmonary:  Good air movement, no audible wheezing, no use of accessory muscles.  Cardiac: RRR, precordium not hyperdynamic. Vascular:  scattered varicosities present bilaterally.  Moderate venous stasis changes to the legs bilaterally.  2+ soft pitting edema. CEAP [id number]   Vessel Right Left  Radial Palpable Palpable  Gastrointestinal: soft, non-distended. No guarding/no peritoneal signs.  Musculoskeletal: M/S 5/5 throughout.  No deformity.  Neurologic: CN 2-12 intact. Pain and light touch intact in extremities.  Symmetrical.  Speech is fluent. Motor exam as listed above. Psychiatric: Judgment intact, Mood & affect appropriate for pt's clinical situation. Dermatologic: Venous rashes no ulcers noted.  No changes consistent with cellulitis. Lymph : No lichenification or skin changes of chronic lymphedema.  CBC Lab Results  Component Value Date   WBC 6.3 07/10/2021   HGB 14.5 07/10/2021   HCT 41.0 07/10/2021   MCV 92 07/10/2021   PLT 202 07/10/2021    BMET    Component Value Date/Time   NA 139 07/10/2021 1037   K 4.5 07/10/2021 1037   CL 102 07/10/2021 1037   CO2 22 07/10/2021 1037   GLUCOSE 110 (H) 07/10/2021 1037   GLUCOSE 103 (H) 06/19/2014 1543   BUN 18 07/10/2021 1037   CREATININE 1.42 (H) 07/10/2021 1037   CALCIUM 9.5 07/10/2021 1037   GFRNONAA 62 05/09/2018 1509   GFRAA 71  05/09/2018 1509   CrCl cannot be calculated (Patient's most recent lab result is older than the maximum 21 days allowed.).  COAG Lab Results  Component Value Date   INR 0.89 06/19/2014    Radiology US Venous Img Lower Unilateral Right (DVT)  Result Date: 05/31/2022 CLINICAL DATA:  Right lower extremity swelling. EXAM: RIGHT LOWER EXTREMITY VENOUS DOPPLER ULTRASOUND TECHNIQUE: Gray-scale sonography with compression, as well as color and duplex ultrasound, were performed to evaluate the deep venous system(s) from the level of the common femoral vein through the popliteal and proximal calf veins. COMPARISON:  None Available. FINDINGS: VENOUS Acute near-occlusive superficial thrombophlebitis of the entire right greater saphenous vein and lesser saphenous vein. Thrombus extends near to the right saphenofemoral  junction but does not extend into the common femoral vein. Normal compressibility of the common femoral, superficial femoral, and popliteal veins, as well as the visualized calf veins. Visualized portions of profunda femoral vein unremarkable. No filling defects to suggest DVT on grayscale or color Doppler imaging. Doppler waveforms show normal direction of venous flow, normal respiratory plasticity and response to augmentation. Limited views of the contralateral common femoral vein are unremarkable. OTHER None. Limitations: none IMPRESSION: 1. Acute near-occlusive superficial thrombophlebitis of the entire right greater saphenous vein and lesser saphenous vein. Thrombus extends near to the right saphenofemoral junction but does not extend into the common femoral vein. 2. No evidence of deep venous thrombosis in the right lower extremity. Electronically Signed   By: Delbert Phenix M.D.   On: 05/31/2022 13:06     Assessment/Plan There are no diagnoses linked to this encounter.   Levora Dredge, MD  06/14/2022 12:46 PM

## 2022-06-15 ENCOUNTER — Ambulatory Visit (INDEPENDENT_AMBULATORY_CARE_PROVIDER_SITE_OTHER): Payer: PPO | Admitting: Vascular Surgery

## 2022-06-15 ENCOUNTER — Encounter (INDEPENDENT_AMBULATORY_CARE_PROVIDER_SITE_OTHER): Payer: Self-pay | Admitting: Vascular Surgery

## 2022-06-15 VITALS — BP 121/72 | HR 75 | Resp 16 | Wt 187.6 lb

## 2022-06-15 DIAGNOSIS — I8001 Phlebitis and thrombophlebitis of superficial vessels of right lower extremity: Secondary | ICD-10-CM

## 2022-06-15 DIAGNOSIS — E7800 Pure hypercholesterolemia, unspecified: Secondary | ICD-10-CM

## 2022-06-15 DIAGNOSIS — F32A Depression, unspecified: Secondary | ICD-10-CM | POA: Diagnosis not present

## 2022-06-15 DIAGNOSIS — N281 Cyst of kidney, acquired: Secondary | ICD-10-CM | POA: Diagnosis not present

## 2022-06-15 DIAGNOSIS — I7300 Raynaud's syndrome without gangrene: Secondary | ICD-10-CM

## 2022-06-15 DIAGNOSIS — K219 Gastro-esophageal reflux disease without esophagitis: Secondary | ICD-10-CM | POA: Diagnosis not present

## 2022-06-15 DIAGNOSIS — I70219 Atherosclerosis of native arteries of extremities with intermittent claudication, unspecified extremity: Secondary | ICD-10-CM | POA: Diagnosis not present

## 2022-06-15 DIAGNOSIS — F419 Anxiety disorder, unspecified: Secondary | ICD-10-CM | POA: Diagnosis not present

## 2022-06-15 DIAGNOSIS — N1831 Chronic kidney disease, stage 3a: Secondary | ICD-10-CM | POA: Diagnosis not present

## 2022-06-16 NOTE — Telephone Encounter (Signed)
Requested Prescriptions  Pending Prescriptions Disp Refills   sildenafil (REVATIO) 20 MG tablet [Pharmacy Med Name: Sildenafil Citrate 20 MG Oral Tablet] 50 tablet 0    Sig: TAKE 2 TO 5 TABLETS BY MOUTH ONCE DAILY AS NEEDED FOR  DIFFICULTY  OBTAINING  /  MAINTAINING  ERECTION     Urology: Erectile Dysfunction Agents Passed - 06/14/2022  2:55 PM      Passed - AST in normal range and within 360 days    AST  Date Value Ref Range Status  07/10/2021 18 0 - 40 IU/L Final         Passed - ALT in normal range and within 360 days    ALT  Date Value Ref Range Status  07/10/2021 15 0 - 44 IU/L Final         Passed - Last BP in normal range    BP Readings from Last 1 Encounters:  06/15/22 121/72         Passed - Valid encounter within last 12 months    Recent Outpatient Visits           2 weeks ago Gastroesophageal reflux disease with esophagitis without hemorrhage   [place] [facility] Merita Norton T, FNP   4 weeks ago COPD with acute exacerbation [facility])   [place] [facility] Merita Norton T, FNP   2 months ago Poor balance   [place] [facility] Merita Norton T, FNP   8 months ago Stage 3a chronic kidney disease [facility])   [facility] Merita Norton T, FNP   10 months ago Stage 3a chronic kidney disease [facility])   [place] [facility] Jacky Kindle, FNP       Future Appointments             In 4 weeks Willeen Niece, MD [facility]   In 1 month Agbor-Etang, Arlys John, MD [facility]

## 2022-06-17 ENCOUNTER — Other Ambulatory Visit: Payer: Self-pay | Admitting: Dermatology

## 2022-06-17 DIAGNOSIS — L2081 Atopic neurodermatitis: Secondary | ICD-10-CM

## 2022-06-22 ENCOUNTER — Ambulatory Visit (INDEPENDENT_AMBULATORY_CARE_PROVIDER_SITE_OTHER): Payer: PPO

## 2022-06-22 ENCOUNTER — Ambulatory Visit (INDEPENDENT_AMBULATORY_CARE_PROVIDER_SITE_OTHER): Payer: PPO | Admitting: Nurse Practitioner

## 2022-06-22 ENCOUNTER — Encounter (INDEPENDENT_AMBULATORY_CARE_PROVIDER_SITE_OTHER): Payer: Self-pay | Admitting: Nurse Practitioner

## 2022-06-22 VITALS — BP 130/81 | HR 78 | Resp 18 | Ht 71.0 in | Wt 188.6 lb

## 2022-06-22 DIAGNOSIS — I8001 Phlebitis and thrombophlebitis of superficial vessels of right lower extremity: Secondary | ICD-10-CM

## 2022-06-22 DIAGNOSIS — N281 Cyst of kidney, acquired: Secondary | ICD-10-CM | POA: Diagnosis not present

## 2022-06-22 DIAGNOSIS — N182 Chronic kidney disease, stage 2 (mild): Secondary | ICD-10-CM | POA: Diagnosis not present

## 2022-06-22 DIAGNOSIS — Z791 Long term (current) use of non-steroidal anti-inflammatories (NSAID): Secondary | ICD-10-CM | POA: Diagnosis not present

## 2022-06-22 MED ORDER — APIXABAN 5 MG PO TABS
5.0000 mg | ORAL_TABLET | Freq: Two times a day (BID) | ORAL | 3 refills | Status: DC
Start: 2022-06-22 — End: 2023-08-12

## 2022-06-23 DIAGNOSIS — M180 Bilateral primary osteoarthritis of first carpometacarpal joints: Secondary | ICD-10-CM | POA: Diagnosis not present

## 2022-07-07 ENCOUNTER — Encounter (INDEPENDENT_AMBULATORY_CARE_PROVIDER_SITE_OTHER): Payer: Self-pay | Admitting: Nurse Practitioner

## 2022-07-07 NOTE — Progress Notes (Signed)
Subjective:    Patient ID: Edward Rodriguez, male    DOB: May 18, 1940, 82 y.o.   MRN: [id number] Chief Complaint  Patient presents with   Follow-up    pt conv (sooner the better per GS) CVT    The patient presents to the office for evaluation of DVT.  DVT was identified at [facility] by Duplex ultrasound.  The initial symptoms were pain and swelling in the lower extremity.   The patient notes his right leg is much improved.  He states that the swelling is essentially gone.  The hard knots that he could feel have resolved.  The pain that was associated with this event is near illuminated.   The patient has not been using compression therapy at this point.   No SOB or pleuritic chest pains.  No cough or hemoptysis.   No recent shortening of the patient's walking distance or new symptoms consistent with claudication.  No history of rest pain symptoms. No ulcers or wounds of the lower extremities have occurred.   The patient denies amaurosis fugax or recent TIA symptoms. There are no recent neurological changes noted. No recent episodes of angina or shortness of breath documented.    Duplex ultrasound of the right lower extremity dated May 31, 2022 demonstrates normal deep venous system however, there is acute thrombus within the entire right great saphenous vein as well as the right small saphenous vein.  The thrombus does extend to the saphenofemoral junction.     Today, noninvasive studies show continued evidence of acute thrombus throughout the entire right great saphenous vein however there is some protrusion into the deep venous system.    Review of Systems  Cardiovascular:  Positive for leg swelling.  All other systems reviewed and are negative.      Objective:   Physical Exam Vitals reviewed.  HENT:     Head: Normocephalic.  Cardiovascular:     Rate and Rhythm: Normal rate.  Pulmonary:     Effort: Pulmonary effort is normal.  Musculoskeletal:     Right lower leg: Edema  present.  Skin:    General: Skin is warm and dry.  Neurological:     Mental Status: He is alert and oriented to person, place, and time.  Psychiatric:        Mood and Affect: Mood normal.        Behavior: Behavior normal.        Thought Content: Thought content normal.        Judgment: Judgment normal.     BP 130/81 (BP Location: Right Arm)   Pulse 78   Resp 18   Ht 5\' 11"  (1.803 m)   Wt 188 lb 9.6 oz (85.5 kg)   BMI 26.30 kg/m   Past Medical History:  Diagnosis Date   Actinic keratosis 07/26/2007   R dorsum index finger - bx proven    Actinic keratosis 01/25/2008   L inf helix - bx proven    Actinic keratosis 05/16/2009   L lat canthus - bx proven    Anxiety    GERD (gastroesophageal reflux disease)    Vertigo 06/2014   had an episode in 2013.    Social History   Socioeconomic History   Marital status: Married    Spouse name: Not on file   Number of children: 2   Years of education: Not on file   Highest education level: Bachelor's degree (e.g., BA, AB, BS)  Occupational History   Occupation: Drives a bus  for [employer]  Tobacco Use   Smoking status: Former    Types: Cigarettes    Quit date: 06/18/1968    Years since quitting: 54.0   Smokeless tobacco: Never  Vaping Use   Vaping Use: Never used  Substance and Sexual Activity   Alcohol use: Yes    Alcohol/week: 4.0 standard drinks of alcohol    Types: 4 Cans of beer per week    Comment: occasionally   Drug use: No   Sexual activity: Not on file  Other Topics Concern   Not on file  Social History Narrative   Not on file   Social Determinants of Health   Financial Resource Strain: Low Risk  (04/07/2021)   Overall Financial Resource Strain (CARDIA)    Difficulty of Paying Living Expenses: Not very hard  Food Insecurity: No Food Insecurity (04/07/2021)   Hunger Vital Sign    Worried About Running Out of Food in the Last Year: Never true    Ran Out of Food in the Last Year: Never true  Transportation Needs:  No Transportation Needs (04/07/2021)   PRAPARE - Administrator, Civil Service (Medical): No    Lack of Transportation (Non-Medical): No  Physical Activity: Insufficiently Active (04/07/2021)   Exercise Vital Sign    Days of Exercise per Week: 3 days    Minutes of Exercise per Session: 30 min  Stress: No Stress Concern Present (04/07/2021)   [employer] of Occupational Health - Occupational Stress Questionnaire    Feeling of Stress : Only a little  Social Connections: Moderately Isolated (04/07/2021)   Social Connection and Isolation Panel [NHANES]    Frequency of Communication with Friends and Family: Twice a week    Frequency of Social Gatherings with Friends and Family: Once a week    Attends Religious Services: Never    Database administrator or Organizations: No    Attends Banker Meetings: Never    Marital Status: Married  Catering manager Violence: Not At Risk (04/07/2021)   Humiliation, Afraid, Rape, and Kick questionnaire    Fear of Current or Ex-Partner: No    Emotionally Abused: No    Physically Abused: No    Sexually Abused: No    Past Surgical History:  Procedure Laterality Date   ROTATOR CUFF REPAIR Left 06/2014   SHOULDER ARTHROSCOPY Left 06/27/2014   Procedure: ARTHROSCOPY SHOULDER- mini open rotator cuff repair;  Surgeon: Juanell Fairly, MD;  Location: [facility] ORS;  Service: Orthopedics;  Laterality: Left;   SHOULDER OPEN ROTATOR CUFF REPAIR Right 2011    Family History  Problem Relation Age of Onset   Kidney failure Mother        50s   Dementia Father    Heart Problems Sister    Cancer Maternal Grandfather    Cancer Paternal Grandmother    Cancer Paternal Grandfather     Allergies  Allergen Reactions   Sulfa Antibiotics    Elemental Sulfur Rash       Latest Ref Rng & Units 07/10/2021   10:37 AM 05/09/2018    3:09 PM 06/19/2014    3:43 PM  CBC  WBC 3.4 - 10.8 x10E3/uL 6.3  7.5  7.7   Hemoglobin 13.0 - 17.7 g/dL 16.1  09.6   04.5   Hematocrit 37.5 - 51.0 % 41.0  40.8  45.5   Platelets 150 - 450 x10E3/uL 202  246  215       CMP     Component  Value Date/Time   NA 139 07/10/2021 1037   K 4.5 07/10/2021 1037   CL 102 07/10/2021 1037   CO2 22 07/10/2021 1037   GLUCOSE 110 (H) 07/10/2021 1037   GLUCOSE 103 (H) 06/19/2014 1543   BUN 18 07/10/2021 1037   CREATININE 1.42 (H) 07/10/2021 1037   CALCIUM 9.5 07/10/2021 1037   PROT 6.6 07/10/2021 1037   ALBUMIN 4.3 07/10/2021 1037   AST 18 07/10/2021 1037   ALT 15 07/10/2021 1037   ALKPHOS 83 07/10/2021 1037   BILITOT 0.2 07/10/2021 1037   GFRNONAA 62 05/09/2018 1509   GFRAA 71 05/09/2018 1509     No results found.     Assessment & Plan:   1. Thrombophlebitis of superficial veins of right lower extremity Patient's DVT study today showed some slight progression of the thrombus extending into the deep venous system.  Based on this we will place the patient on oral anticoagulation of Eliquis.  He is given samples for the 10 mg loading dose.  Following this the patient will resume 5 mg for the next 3 months.  Will see him back in 3 months with DVT study in order to reevaluate.  Patient advised to continue wearing compression in addition to elevation and activity.   Current Outpatient Medications on File Prior to Visit  Medication Sig Dispense Refill   albuterol (VENTOLIN HFA) 108 (90 Base) MCG/ACT inhaler INHALE 2 PUFFS BY MOUTH EVERY 6 HOURS AS NEEDED FOR WHEEZING AND FOR SHORTNESS OF BREATH 18 g 0   clobetasol (TEMOVATE) 0.05 % external solution MIX CLOBETASOL IN A JAR OR CERAVE CREAM. APPLY TO THE AFFECTED AREAS OF RASH TWICE DAILY UNTIL IMPROVED. AVOID FACE, GROIN, AXILLA 50 mL 0   dupilumab (DUPIXENT) 300 MG/2ML prefilled syringe Inject 300 mg into the skin every 14 (fourteen) days. For maintenance. 12 mL 1   FLUZONE HIGH-DOSE QUADRIVALENT 0.7 ML SUSY      ipratropium (ATROVENT) 0.03 % nasal spray Place 2 sprays into both nostrils every 12 (twelve) hours.  30 mL 12   meloxicam (MOBIC) 15 MG tablet Take 15 mg by mouth daily.     omeprazole (PRILOSEC) 20 MG capsule Take 2 capsules (40 mg total) by mouth daily. 60 capsule 11   PFIZER COVID-19 VAC BIVALENT injection      sildenafil (REVATIO) 20 MG tablet TAKE 2 TO 5 TABLETS BY MOUTH ONCE DAILY AS NEEDED FOR  DIFFICULTY  OBTAINING  /  MAINTAINING  ERECTION 50 tablet 0   SSD 1 % cream APPLY TOPICALLY DAILY 50 g 0   venlafaxine XR (EFFEXOR-XR) 150 MG 24 hr capsule TAKE 2 CAPSULES BY MOUTH ONCE DAILY WITH BREAKFAST 180 capsule 0   amLODipine (NORVASC) 5 MG tablet Take 1 tablet (5 mg total) by mouth daily. (Patient not taking: Reported on 05/19/2022) 30 tablet 1   amoxicillin-clavulanate (AUGMENTIN) 875-125 MG tablet Take 1 tablet by mouth 2 (two) times daily. (Patient not taking: Reported on 06/15/2022) 14 tablet 0   Ciclopirox 1 % shampoo Apply 1 Application topically as directed. Wash scalp q shampoo, let sit 5 minutes and rinse out (Patient not taking: Reported on 06/15/2022) 120 mL 6   clindamycin (CLEOCIN T) 1 % external solution Apply topically daily. Qd to aa scalp for bumps (Patient not taking: Reported on 05/19/2022) 60 mL 6   Hydrocortisone Ace-Pramoxine 1.85-1.15 % CREA Apply 1 Application topically as needed. (Patient not taking: Reported on 05/19/2022) 60 g 0   hydrOXYzine (ATARAX) 10 MG tablet Take  1-2 tablets by mouth every evening as needed for itch. (Patient not taking: Reported on 05/19/2022) 60 tablet 2   montelukast (SINGULAIR) 10 MG tablet Take 1 tablet (10 mg total) by mouth at bedtime. (Patient not taking: Reported on 06/22/2022) 30 tablet 3   predniSONE (DELTASONE) 20 MG tablet Take 1 tablet (20 mg total) by mouth daily with breakfast. (Patient not taking: Reported on 06/15/2022) 3 tablet 0   rosuvastatin (CRESTOR) 5 MG tablet Take 1 tablet (5 mg total) by mouth daily. (Patient not taking: Reported on 06/15/2022) 30 tablet 3   triamcinolone (KENALOG) 0.1 % APPLY  CREAM EXTERNALLY TO AFFECTED AREA TWICE  DAILY RASH  OR  ITCHING AS NEEDED.  AVOID  FACE,  GROIN  AND  AXILLA. (Patient not taking: Reported on 05/19/2022) 454 g 2   No current facility-administered medications on file prior to visit.    There are no Patient Instructions on file for this visit. No follow-ups on file.   Georgiana Spinner, NP

## 2022-07-08 ENCOUNTER — Other Ambulatory Visit: Payer: Self-pay

## 2022-07-08 DIAGNOSIS — L2081 Atopic neurodermatitis: Secondary | ICD-10-CM

## 2022-07-08 MED ORDER — DUPIXENT 300 MG/2ML ~~LOC~~ SOSY
300.0000 mg | PREFILLED_SYRINGE | SUBCUTANEOUS | 0 refills | Status: DC
Start: 2022-07-08 — End: 2022-07-14

## 2022-07-08 NOTE — Progress Notes (Signed)
Patient left VM needing Dupixent RF. 1 RF sent in to keep patient covered until appt next week. aw

## 2022-07-09 ENCOUNTER — Encounter (INDEPENDENT_AMBULATORY_CARE_PROVIDER_SITE_OTHER): Payer: Self-pay | Admitting: Nurse Practitioner

## 2022-07-14 ENCOUNTER — Ambulatory Visit: Payer: PPO | Admitting: Dermatology

## 2022-07-14 VITALS — BP 107/67 | HR 85

## 2022-07-14 DIAGNOSIS — L570 Actinic keratosis: Secondary | ICD-10-CM | POA: Diagnosis not present

## 2022-07-14 DIAGNOSIS — L820 Inflamed seborrheic keratosis: Secondary | ICD-10-CM | POA: Diagnosis not present

## 2022-07-14 DIAGNOSIS — X32XXXA Exposure to sunlight, initial encounter: Secondary | ICD-10-CM | POA: Diagnosis not present

## 2022-07-14 DIAGNOSIS — W908XXA Exposure to other nonionizing radiation, initial encounter: Secondary | ICD-10-CM | POA: Diagnosis not present

## 2022-07-14 DIAGNOSIS — L739 Follicular disorder, unspecified: Secondary | ICD-10-CM | POA: Diagnosis not present

## 2022-07-14 DIAGNOSIS — L578 Other skin changes due to chronic exposure to nonionizing radiation: Secondary | ICD-10-CM | POA: Diagnosis not present

## 2022-07-14 DIAGNOSIS — D692 Other nonthrombocytopenic purpura: Secondary | ICD-10-CM | POA: Diagnosis not present

## 2022-07-14 DIAGNOSIS — Z79899 Other long term (current) drug therapy: Secondary | ICD-10-CM

## 2022-07-14 DIAGNOSIS — L2081 Atopic neurodermatitis: Secondary | ICD-10-CM | POA: Diagnosis not present

## 2022-07-14 DIAGNOSIS — Z7189 Other specified counseling: Secondary | ICD-10-CM

## 2022-07-14 MED ORDER — DOXYCYCLINE MONOHYDRATE 100 MG PO CAPS: 100.0000 mg | ORAL_CAPSULE | Freq: Every day | ORAL | 2 refills | Status: DC

## 2022-07-14 MED ORDER — CLOBETASOL PROPIONATE 0.05 % EX SOLN: CUTANEOUS | 2 refills | Status: DC

## 2022-07-14 MED ORDER — DUPIXENT 300 MG/2ML ~~LOC~~ SOSY
300.0000 mg | PREFILLED_SYRINGE | SUBCUTANEOUS | 5 refills | Status: DC
Start: 2022-07-14 — End: 2022-12-14

## 2022-07-14 NOTE — Progress Notes (Signed)
Follow-Up Visit   Subjective  Edward Rodriguez is a 82 y.o. male who presents for the following: Atopic Neurodermatitis of the trunk and extremities, improved with Dupixent injections. He also has Clobetasol/CeraVe mix prn. Not itching as often. Folliculitis of the scalp is not improving with clindamycin solution. He tried ciclopirox shampoo, but it didn't help so he switched back to OTC. He also has irritated growths on the arms to check.     The following portions of the chart were reviewed this encounter and updated as appropriate: medications, allergies, medical history  Review of Systems:  No other skin or systemic complaints except as noted in HPI or Assessment and Plan.  Objective  Well appearing patient in no apparent distress; mood and affect are within normal limits.  Areas Examined: Scalp, trunk, extremities  Relevant physical exam findings are noted in the Assessment and Plan.  R hand and forearm x 10, L hand x 8 (18) Keratotic papules.   Right Temple/Zygoma area x 8, L temple x 3, L cheek x 3, L ear x 1, R ear x 1 (16) Pink scaly macules.     Assessment & Plan  ACTINIC DAMAGE WITH PRECANCEROUS ACTINIC KERATOSES Counseling for Topical Chemotherapy Management: Patient exhibits: - Severe, confluent actinic changes with pre-cancerous actinic keratoses that is secondary to cumulative UV radiation exposure over time - Condition that is severe; chronic, not at goal. - diffuse scaly erythematous macules and papules with underlying dyspigmentation - Discussed Prescription "Field Treatment" topical Chemotherapy for Severe, Chronic Confluent Actinic Changes with Pre-Cancerous Actinic Keratoses Field treatment involves treatment of an entire area of skin that has confluent Actinic Changes (Sun/ Ultraviolet light damage) and PreCancerous Actinic Keratoses by method of PhotoDynamic Therapy (PDT) and/or prescription Topical Chemotherapy agents such as 5-fluorouracil,  5-fluorouracil/calcipotriene, and/or imiquimod.  The purpose is to decrease the number of clinically evident and subclinical PreCancerous lesions to prevent progression to development of skin cancer by chemically destroying early precancer changes that may or may not be visible.  It has been shown to reduce the risk of developing skin cancer in the treated area. As a result of treatment, redness, scaling, crusting, and open sores may occur during treatment course. One or more than one of these methods may be used and may have to be used several times to control, suppress and eliminate the PreCancerous changes. Discussed treatment course, expected reaction, and possible side effects. - Recommend daily broad spectrum sunscreen SPF 30+ to sun-exposed areas, reapply every 2 hours as needed.  - Staying in the shade or wearing long sleeves, sun glasses (UVA+UVB protection) and wide brim hats (4-inch brim around the entire circumference of the hat) are also recommended. - Call for new or changing lesions. - Recommend red light PDT with debridement to face.  Patient will schedule in fall.    Inflamed seborrheic keratosis (18) R hand and forearm x 10, L hand x 8  vs Hypertrophic Aks.  Symptomatic, irritating, patient would like treated.  Destruction of lesion - R hand and forearm x 10, L hand x 8  Destruction method: cryotherapy   Informed consent: discussed and consent obtained   Lesion destroyed using liquid nitrogen: Yes   Region frozen until ice ball extended beyond lesion: Yes   Outcome: patient tolerated procedure well with no complications   Post-procedure details: wound care instructions given   Additional details:  Prior to procedure, discussed risks of blister formation, small wound, skin dyspigmentation, or rare scar following cryotherapy. Recommend Vaseline ointment  to treated areas while healing.   Atopic neurodermatitis  Related Medications hydrOXYzine (ATARAX) 10 MG tablet Take 1-2  tablets by mouth every evening as needed for itch.  clobetasol (TEMOVATE) 0.05 % external solution MIX CLOBETASOL IN A JAR OR CERAVE CREAM. APPLY TO THE AFFECTED AREAS OF RASH TWICE DAILY UNTIL IMPROVED. AVOID FACE, GROIN, AXILLA  dupilumab (DUPIXENT) 300 MG/2ML prefilled syringe Inject 300 mg into the skin every 14 (fourteen) days. For maintenance.  AK (actinic keratosis) (16) Right Temple/Zygoma area x 8, L temple x 3, L cheek x 3, L ear x 1, R ear x 1  Actinic keratoses are precancerous spots that appear secondary to cumulative UV radiation exposure/sun exposure over time. They are chronic with expected duration over 1 year. A portion of actinic keratoses will progress to squamous cell carcinoma of the skin. It is not possible to reliably predict which spots will progress to skin cancer and so treatment is recommended to prevent development of skin cancer.  Recommend daily broad spectrum sunscreen SPF 30+ to sun-exposed areas, reapply every 2 hours as needed.  Recommend staying in the shade or wearing long sleeves, sun glasses (UVA+UVB protection) and wide brim hats (4-inch brim around the entire circumference of the hat). Call for new or changing lesions.  Destruction of lesion - Right Temple/Zygoma area x 8, L temple x 3, L cheek x 3, L ear x 1, R ear x 1  Destruction method: cryotherapy   Informed consent: discussed and consent obtained   Lesion destroyed using liquid nitrogen: Yes   Region frozen until ice ball extended beyond lesion: Yes   Outcome: patient tolerated procedure well with no complications   Post-procedure details: wound care instructions given   Additional details:  Prior to procedure, discussed risks of blister formation, small wound, skin dyspigmentation, or rare scar following cryotherapy. Recommend Vaseline ointment to treated areas while healing.   Folliculitis  Related Medications clindamycin (CLEOCIN T) 1 % external solution Apply topically daily. Qd to aa  scalp for bumps    ATOPIC NEURODERMATITIS Exam: Clear <1% BSA  Chronic condition with duration or expected duration over one year. Currently well-controlled.   Atopic dermatitis - Severe, on Dupixent (biologic medication).  Atopic dermatitis (eczema) is a chronic, relapsing, pruritic condition that can significantly affect quality of life. It is often associated with allergic rhinitis and/or asthma and can require treatment with topical medications, phototherapy, or in severe cases biologic medications, which require long term medication management.    Treatment Plan:  Continue Clobetasol/CeraVe mix QD/BID prn. Avoid face, groin, axilla.   Continue Dupixent 300 mg/95mL SQ QOW. Patient denies side effects.  Potential side effects include allergic reaction, herpes infections, injection site reactions and conjunctivitis (inflammation of the eyes).  The use of Dupixent requires long term medication management, including periodic office visits.  Recommend gentle skin care.  FOLLICULITIS Exam: Follicular pink crusted papules on the posterior neck and posterior scalp  Treatment Plan: Start doxycycline monohydrate 100 MG take 1 po QD with food dsp #30 2Rf.  Doxycycline should be taken with food to prevent nausea. Do not lay down for 30 minutes after taking. Be cautious with sun exposure and use good sun protection while on this medication. Pregnant women should not take this medication.  May decrease to low dose doxy on f/up visit for chronic use  Start Clobetasol solution Apply to AA scalp QD/BID prn itch/flares Topical steroids (such as triamcinolone, fluocinolone, fluocinonide, mometasone, clobetasol, halobetasol, betamethasone, hydrocortisone) can cause thinning and  lightening of the skin if they are used for too long in the same area. Your physician has selected the right strength medicine for your problem and area affected on the body. Please use your medication only as directed by your  physician to prevent side effects.   Continue Clindamycin solution to scalp QD/BID prn.    Purpura - Chronic; persistent and recurrent.  Treatable, but not curable. - Violaceous macules and patches - Benign - Related to trauma, age, sun damage and/or use of blood thinners, chronic use of topical and/or oral steroids - Observe - Can use OTC arnica containing moisturizer such as Dermend Bruise Formula if desired - Call for worsening or other concerns   Return in about 6 months (around 01/13/2023) for Atopic Dermatitis.  ICherlyn Labella, CMA, am acting as scribe for Willeen Niece, MD .   Documentation: I have reviewed the above documentation for accuracy and completeness, and I agree with the above.  Willeen Niece, MD

## 2022-07-14 NOTE — Patient Instructions (Addendum)
Cryotherapy Aftercare  Wash gently with soap and water everyday.   Apply Vaseline and Band-Aid daily until healed.   Dupilumab (Dupixent) is a treatment given by injection for adults and children with moderate-to-severe atopic dermatitis. Goal is control of skin condition, not cure. It is given as 2 injections at the first dose followed by 1 injection ever 2 weeks thereafter.  Young children are dosed monthly.  Potential side effects include allergic reaction, herpes infections, injection site reactions and conjunctivitis (inflammation of the eyes).  The use of Dupixent requires long term medication management, including periodic office visits.  Topical steroids (such as triamcinolone, fluocinolone, fluocinonide, mometasone, clobetasol, halobetasol, betamethasone, hydrocortisone) can cause thinning and lightening of the skin if they are used for too long in the same area. Your physician has selected the right strength medicine for your problem and area affected on the body. Please use your medication only as directed by your physician to prevent side effects.   Doxycycline should be taken with food to prevent nausea. Do not lay down for 30 minutes after taking. Be cautious with sun exposure and use good sun protection while on this medication. Pregnant women should not take this medication.    Due to recent changes in healthcare laws, you may see results of your pathology and/or laboratory studies on MyChart before the doctors have had a chance to review them. We understand that in some cases there may be results that are confusing or concerning to you. Please understand that not all results are received at the same time and often the doctors may need to interpret multiple results in order to provide you with the best plan of care or course of treatment. Therefore, we ask that you please give [place] 2 business days to thoroughly review all your results before contacting the office for clarification. Should  we see a critical lab result, you will be contacted sooner.   If You Need Anything After Your Visit  If you have any questions or concerns for your doctor, please call our main line at [phone] and press option 4 to reach your doctor's medical assistant. If no one answers, please leave a voicemail as directed and we will return your call as soon as possible. Messages left after 4 pm will be answered the following business day.   You may also send [place] a message via MyChart. We typically respond to MyChart messages within 1-2 business days.  For prescription refills, please ask your pharmacy to contact our office. Our fax number is [phone].  If you have an urgent issue when the clinic is closed that cannot wait until the next business day, you can page your doctor at the number below.    Please note that while we do our best to be available for urgent issues outside of office hours, we are not available 24/7.   If you have an urgent issue and are unable to reach [place], you may choose to seek medical care at your doctor's office, retail clinic, urgent care center, or emergency room.  If you have a medical emergency, please immediately call 911 or go to the emergency department.  Pager Numbers  - Dr. Gwen Pounds: [phone]  - Dr. Neale Burly: [phone]  - Dr. Roseanne Reno: [phone]  In the event of inclement weather, please call our main line at [phone] for an update on the status of any delays or closures.  Dermatology Medication Tips: Please keep the boxes that topical medications come in in order to help keep  track of the instructions about where and how to use these. Pharmacies typically print the medication instructions only on the boxes and not directly on the medication tubes.   If your medication is too expensive, please contact our office at [phone] option 4 or send [place] a message through MyChart.   We are unable to tell what your co-pay for medications will be in  advance as this is different depending on your insurance coverage. However, we may be able to find a substitute medication at lower cost or fill out paperwork to get insurance to cover a needed medication.   If a prior authorization is required to get your medication covered by your insurance company, please allow [place] 1-2 business days to complete this process.  Drug prices often vary depending on where the prescription is filled and some pharmacies may offer cheaper prices.  The website www.goodrx.com contains coupons for medications through different pharmacies. The prices here do not account for what the cost may be with help from insurance (it may be cheaper with your insurance), but the website can give you the price if you did not use any insurance.  - You can print the associated coupon and take it with your prescription to the pharmacy.  - You may also stop by our office during regular business hours and pick up a GoodRx coupon card.  - If you need your prescription sent electronically to a different pharmacy, notify our office through [facility] or by phone at [phone] option 4.     Si Usted Necesita Algo Despus de Su Visita  Tambin puede enviarnos un mensaje a travs de Clinical cytogeneticist. Por lo general respondemos a los mensajes de MyChart en el transcurso de 1 a 2 das hbiles.  Para renovar recetas, por favor pida a su farmacia que se ponga en contacto con nuestra oficina. Annie Sable de fax es [place] [phone].  Si tiene un asunto urgente cuando la clnica est cerrada y que no puede esperar hasta el siguiente da hbil, puede llamar/localizar a su doctor(a) al nmero que aparece a continuacin.   Por favor, tenga en cuenta que aunque hacemos todo lo posible para estar disponibles para asuntos urgentes fuera del horario de [place], no estamos disponibles las 24 horas del da, los 7 [address]  [address] de la [place].   Si tiene un problema urgente y no puede comunicarse con nosotros, puede  optar por buscar atencin mdica  en el consultorio de su doctor(a), en una clnica privada, en un centro de atencin urgente o en una sala de emergencias.  Si tiene Engineer, drilling, por favor llame inmediatamente al 911 o vaya a la sala de emergencias.  Nmeros de bper  - Dr. Gwen Pounds: [phone]  - Dra. Moye: [phone]  - Dra. Roseanne Reno: [phone]  En caso de inclemencias del [place], por favor llame a Lacy Duverney principal al [phone] para una actualizacin sobre el [place] de cualquier retraso o cierre.  Consejos para la medicacin en dermatologa: Por favor, guarde las cajas en las que vienen los medicamentos de uso tpico para ayudarle a seguir las instrucciones sobre dnde y cmo usarlos. Las farmacias generalmente imprimen las instrucciones del medicamento slo en las cajas y no directamente en los tubos del [place].   Si su medicamento es muy caro, por favor, pngase en contacto con Rolm [place]ala llamando al [phone] y presione la opcin 4 o envenos un mensaje a travs de Clinical cytogeneticist.   No podemos decirle cul ser su copago por los medicamentos  por adelantado ya que esto es diferente dependiendo de la cobertura de su seguro. Sin embargo, es posible que podamos encontrar un medicamento sustituto a Audiological scientist un formulario para que el seguro cubra el medicamento que se considera necesario.   Si se requiere una autorizacin previa para que su compaa de seguros [place] su medicamento, por favor permtanos de 1 a 2 das hbiles para completar [address]  Los precios de los medicamentos varan con frecuencia dependiendo del Environmental consultant de dnde se surte la receta y alguna farmacias pueden ofrecer precios ms baratos.  El sitio web www.goodrx.com tiene cupones para medicamentos de Health and safety inspector. Los precios aqu no tienen en cuenta lo que podra costar con la ayuda del seguro (puede ser ms barato con su seguro), pero el sitio web puede darle el  precio si no utiliz Tourist information centre manager.  - Puede imprimir el cupn correspondiente y llevarlo con su receta a la farmacia.  - Tambin puede pasar por nuestra oficina durante el horario de atencin regular y Education officer, museum una tarjeta de cupones de GoodRx.  - Si necesita que su receta se enve electrnicamente a una farmacia diferente, informe a nuestra oficina a travs de MyChart de [place] o por telfono llamando al [phone] y presione la opcin 4.

## 2022-07-27 ENCOUNTER — Telehealth: Payer: Self-pay

## 2022-07-27 NOTE — Telephone Encounter (Signed)
Called and left voice mail to inquire about any previous cardiac history

## 2022-08-03 ENCOUNTER — Encounter: Payer: Self-pay | Admitting: Cardiology

## 2022-08-03 ENCOUNTER — Ambulatory Visit: Payer: PPO | Attending: Cardiology | Admitting: Cardiology

## 2022-08-03 VITALS — BP 120/64 | HR 76 | Ht 71.0 in | Wt 187.2 lb

## 2022-08-03 DIAGNOSIS — R0609 Other forms of dyspnea: Secondary | ICD-10-CM | POA: Diagnosis not present

## 2022-08-03 DIAGNOSIS — I82401 Acute embolism and thrombosis of unspecified deep veins of right lower extremity: Secondary | ICD-10-CM

## 2022-08-03 NOTE — Progress Notes (Signed)
Cardiology Office Note:    Date:  08/03/2022   ID:  Edward Rodriguez, DOB [birth year], MRN [id number]  PCP:  Jacky Kindle, FNP   [facility] Providers Cardiologist:  Debbe Odea, MD     Referring MD: Jacky Kindle, FNP   Chief Complaint  Patient presents with   New Patient (Initial Visit)    Patient is unsure why he has been referred.  Has been diagnosed with atherosclerotic PVD with intermittent claudication.     Edward Rodriguez is a 82 y.o. male who is being seen today for the evaluation of shortness of breath at the request of Jacky Kindle, FNP.   History of Present Illness:    Edward Rodriguez is a 82 y.o. male with a hx of right lower extremity DVT on Eliquis, prior smoker x 10 to 15 years who presents with shortness of breath.  States having shortness of breath ongoing for several years now.  Had an episode of shortness of breath while playing golf in the heat.  Also felt nauseous.  Followed up with PCP, was recommended to see cardiology.  He denies chest pain, denies edema.  Recently diagnosed with right leg pain, workup showed DVT, placed on Eliquis.  Endorses easy bruisability.  Past Medical History:  Diagnosis Date   Actinic keratosis 07/26/2007   R dorsum index finger - bx proven    Actinic keratosis 01/25/2008   L inf helix - bx proven    Actinic keratosis 05/16/2009   L lat canthus - bx proven    Anxiety    GERD (gastroesophageal reflux disease)    Vertigo 06/2014   had an episode in 2013.    Past Surgical History:  Procedure Laterality Date   ROTATOR CUFF REPAIR Left 06/2014   SHOULDER ARTHROSCOPY Left 06/27/2014   Procedure: ARTHROSCOPY SHOULDER- mini open rotator cuff repair;  Surgeon: Juanell Fairly, MD;  Location: [facility] ORS;  Service: Orthopedics;  Laterality: Left;   SHOULDER OPEN ROTATOR CUFF REPAIR Right 2011    Current Medications: Current Meds  Medication Sig   albuterol (VENTOLIN HFA) 108 (90 Base) MCG/ACT inhaler INHALE 2 PUFFS BY MOUTH EVERY  6 HOURS AS NEEDED FOR WHEEZING AND FOR SHORTNESS OF BREATH   apixaban (ELIQUIS) 5 MG TABS tablet Take 1 tablet (5 mg total) by mouth 2 (two) times daily.   clindamycin (CLEOCIN T) 1 % external solution Apply topically daily. Qd to aa scalp for bumps   doxycycline (MONODOX) 100 MG capsule Take 1 capsule (100 mg total) by mouth daily. Take with food.   dupilumab (DUPIXENT) 300 MG/2ML prefilled syringe Inject 300 mg into the skin every 14 (fourteen) days. For maintenance.   ipratropium (ATROVENT) 0.03 % nasal spray Place 2 sprays into both nostrils every 12 (twelve) hours.   meloxicam (MOBIC) 15 MG tablet Take 15 mg by mouth daily.   omeprazole (PRILOSEC) 20 MG capsule Take 2 capsules (40 mg total) by mouth daily.   sildenafil (REVATIO) 20 MG tablet TAKE 2 TO 5 TABLETS BY MOUTH ONCE DAILY AS NEEDED FOR  DIFFICULTY  OBTAINING  /  MAINTAINING  ERECTION   venlafaxine XR (EFFEXOR-XR) 150 MG 24 hr capsule TAKE 2 CAPSULES BY MOUTH ONCE DAILY WITH BREAKFAST     Allergies:   Sulfa antibiotics and Elemental sulfur   Social History   Socioeconomic History   Marital status: Married    Spouse name: Not on file   Number of children: 2   Years of education: Not on  file   Highest education level: Bachelor's degree (e.g., BA, AB, BS)  Occupational History   Occupation: Drives a bus for [employer]  Tobacco Use   Smoking status: Former    Types: Cigarettes    Quit date: 06/18/1968    Years since quitting: 54.1   Smokeless tobacco: Never  Vaping Use   Vaping Use: Never used  Substance and Sexual Activity   Alcohol use: Yes    Alcohol/week: 4.0 standard drinks of alcohol    Types: 4 Cans of beer per week    Comment: occasionally   Drug use: No   Sexual activity: Not on file  Other Topics Concern   Not on file  Social History Narrative   Not on file   Social Determinants of Health   Financial Resource Strain: Low Risk  (04/07/2021)   Overall Financial Resource Strain (CARDIA)    Difficulty of Paying  Living Expenses: Not very hard  Food Insecurity: No Food Insecurity (04/07/2021)   Hunger Vital Sign    Worried About Running Out of Food in the Last Year: Never true    Ran Out of Food in the Last Year: Never true  Transportation Needs: No Transportation Needs (04/07/2021)   PRAPARE - Administrator, Civil Service (Medical): No    Lack of Transportation (Non-Medical): No  Physical Activity: Insufficiently Active (04/07/2021)   Exercise Vital Sign    Days of Exercise per Week: 3 days    Minutes of Exercise per Session: 30 min  Stress: No Stress Concern Present (04/07/2021)   [employer] of Occupational Health - Occupational Stress Questionnaire    Feeling of Stress : Only a little  Social Connections: Moderately Isolated (04/07/2021)   Social Connection and Isolation Panel [NHANES]    Frequency of Communication with Friends and Family: Twice a week    Frequency of Social Gatherings with Friends and Family: Once a week    Attends Religious Services: Never    Database administrator or Organizations: No    Attends Engineer, structural: Never    Marital Status: Married     Family History: The patient's family history includes Cancer in his maternal grandfather, paternal grandfather, and paternal grandmother; Dementia in his father; Heart Problems in his sister; Heart disease in his sister; Kidney failure in his mother.  ROS:   Please see the history of present illness.     All other systems reviewed and are negative.  EKGs/Labs/Other Studies Reviewed:    The following studies were reviewed today:  EKG obtained, EKG shows normal sinus rhythm, normal ECG.      Recent Labs: No results found for requested labs within last 365 days.  Recent Lipid Panel    Component Value Date/Time   CHOL 175 07/10/2021 1037   TRIG 203 (H) 07/10/2021 1037   HDL 43 07/10/2021 1037   CHOLHDL 4.1 07/10/2021 1037   LDLCALC 97 07/10/2021 1037     Risk  Assessment/Calculations:             Physical Exam:    VS:  BP 120/64 (BP Location: Left Arm, Patient Position: Sitting, Cuff Size: Normal)   Pulse 76   Ht 5\' 11"  (1.803 m)   Wt 187 lb 3.2 oz (84.9 kg)   SpO2 95%   BMI 26.11 kg/m     Wt Readings from Last 3 Encounters:  08/03/22 187 lb 3.2 oz (84.9 kg)  06/22/22 188 lb 9.6 oz (85.5 kg)  06/15/22 187  lb 9.6 oz (85.1 kg)     GEN:  Well nourished, well developed in no acute distress HEENT: Normal NECK: No JVD; No carotid bruits CARDIAC: RRR, no murmurs, rubs, gallops RESPIRATORY:  Clear to auscultation without rales, wheezing or rhonchi  ABDOMEN: Soft, non-tender, non-distended MUSCULOSKELETAL:  No edema; No deformity  SKIN: Warm and dry NEUROLOGIC:  Alert and oriented x 3 PSYCHIATRIC:  Normal affect   ASSESSMENT:    1. Dyspnea on exertion   2. Deep vein thrombosis (DVT) of right lower extremity, unspecified chronicity, unspecified vein (HCC)    PLAN:    In order of problems listed above:  Dyspnea on exertion, ongoing several years now.  Denies chest pain.  Recent diagnosis of DVT.  Obtain echocardiogram, obtain CT angio to rule out PE.  Consider stress test if symptoms persist. Right lower extremity DVT, on Eliquis, managed by vascular surgery.  Follow-up in 2 to 3 months       Medication Adjustments/Labs and Tests Ordered: Current medicines are reviewed at length with the patient today.  Concerns regarding medicines are outlined above.  Orders Placed This Encounter  Procedures   CT Angio Chest Pulmonary Embolism (PE) W or WO Contrast   EKG 12-Lead   ECHOCARDIOGRAM COMPLETE   No orders of the defined types were placed in this encounter.   Patient Instructions  Medication Instructions:   Your physician recommends that you continue on your current medications as directed. Please refer to the Current Medication list given to you today.  *If you need a refill on your cardiac medications before your next  appointment, please call your pharmacy*   Lab Work:  None Ordered  If you have labs (blood work) drawn today and your tests are completely normal, you will receive your results only by: MyChart Message (if you have MyChart) OR A paper copy in the mail If you have any lab test that is abnormal or we need to change your treatment, we will call you to review the results.   Testing/Procedures:  Your physician has requested that you have an echocardiogram. Echocardiography is a painless test that uses sound waves to create images of your heart. It provides your doctor with information about the size and shape of your heart and how well your heart's chambers and valves are working. This procedure takes approximately one hour. There are no restrictions for this procedure. Please do NOT wear cologne, perfume, aftershave, or lotions (deodorant is allowed). Please arrive 15 minutes prior to your appointment time.  2. CT Angiography (CTA) chest/aorta, is a special type of CT scan that uses a computer to produce multi-dimensional views of major blood vessels throughout the body. In CT angiography, a contrast material is injected through an IV to help visualize the blood vessels  Nothing to eat or drink 4 hours prior to test  [facility] [address]  [place], [place] [address]   Follow-Up: At [facility], you and your health needs are our priority.  As part of our continuing mission to provide you with exceptional heart care, we have created designated Provider Care Teams.  These Care Teams include your primary Cardiologist (physician) and Advanced Practice Providers (APPs -  Physician Assistants and Nurse Practitioners) who all work together to provide you with the care you need, when you need it.  We recommend signing up for the patient portal called "MyChart".  Sign up information is provided on this After Visit Summary.  MyChart is  used to  connect with patients for Virtual Visits (Telemedicine).  Patients are able to view lab/test results, encounter notes, upcoming appointments, etc.  Non-urgent messages can be sent to your provider as well.   To learn more about what you can do with MyChart, go to [email or web address].    Your next appointment:    AFTER TESTING   Provider:   You may see Debbe Odea, MD or one of the following Advanced Practice Providers on your designated Care Team:   Nicolasa Ducking, NP Eula Listen, PA-C Cadence Fransico Michael, PA-C Charlsie Quest, NP   Signed, Debbe Odea, MD  08/03/2022 9:38 AM    [facility]

## 2022-08-03 NOTE — Patient Instructions (Signed)
Medication Instructions:   Your physician recommends that you continue on your current medications as directed. Please refer to the Current Medication list given to you today.  *If you need a refill on your cardiac medications before your next appointment, please call your pharmacy*   Lab Work:  None Ordered  If you have labs (blood work) drawn today and your tests are completely normal, you will receive your results only by: MyChart Message (if you have MyChart) OR A paper copy in the mail If you have any lab test that is abnormal or we need to change your treatment, we will call you to review the results.   Testing/Procedures:  Your physician has requested that you have an echocardiogram. Echocardiography is a painless test that uses sound waves to create images of your heart. It provides your doctor with information about the size and shape of your heart and how well your heart's chambers and valves are working. This procedure takes approximately one hour. There are no restrictions for this procedure. Please do NOT wear cologne, perfume, aftershave, or lotions (deodorant is allowed). Please arrive 15 minutes prior to your appointment time.  2. CT Angiography (CTA) chest/aorta, is a special type of CT scan that uses a computer to produce multi-dimensional views of major blood vessels throughout the body. In CT angiography, a contrast material is injected through an IV to help visualize the blood vessels  Nothing to eat or drink 4 hours prior to test  [facility] [address]  [place], [place] [address]   Follow-Up: At [facility], you and your health needs are our priority.  As part of our continuing mission to provide you with exceptional heart care, we have created designated Provider Care Teams.  These Care Teams include your primary Cardiologist (physician) and Advanced Practice Providers (APPs -  Physician Assistants and Nurse  Practitioners) who all work together to provide you with the care you need, when you need it.  We recommend signing up for the patient portal called "MyChart".  Sign up information is provided on this After Visit Summary.  MyChart is used to connect with patients for Virtual Visits (Telemedicine).  Patients are able to view lab/test results, encounter notes, upcoming appointments, etc.  Non-urgent messages can be sent to your provider as well.   To learn more about what you can do with MyChart, go to [email or web address].    Your next appointment:    AFTER TESTING   Provider:   You may see Debbe Odea, MD or one of the following Advanced Practice Providers on your designated Care Team:   Nicolasa Ducking, NP Eula Listen, PA-C Cadence Fransico Michael, PA-C Charlsie Quest, NP

## 2022-08-04 ENCOUNTER — Other Ambulatory Visit: Payer: Self-pay | Admitting: Family Medicine

## 2022-08-04 DIAGNOSIS — N529 Male erectile dysfunction, unspecified: Secondary | ICD-10-CM

## 2022-08-04 DIAGNOSIS — N1831 Chronic kidney disease, stage 3a: Secondary | ICD-10-CM

## 2022-08-05 NOTE — Telephone Encounter (Signed)
Requested medication (s) are due for refill today: yes  Requested medication (s) are on the active medication list: yes    Last refill: 06/16/22  #50  0 refills  Future visit scheduled no  Notes to clinic: Failed due to labs, please review. Thank you.  Requested Prescriptions  Pending Prescriptions Disp Refills   sildenafil (REVATIO) 20 MG tablet [Pharmacy Med Name: Sildenafil Citrate 20 MG Oral Tablet] 50 tablet 0    Sig: TAKE 2 TO 5 TABLETS BY MOUTH ONCE DAILY AS NEEDED FOR  DIFFICULTY  OBTAINING  /  MAINTAINING  ERECTION     Urology: Erectile Dysfunction Agents Failed - 08/04/2022  3:42 PM      Failed - AST in normal range and within 360 days    AST  Date Value Ref Range Status  07/10/2021 18 0 - 40 IU/L Final         Failed - ALT in normal range and within 360 days    ALT  Date Value Ref Range Status  07/10/2021 15 0 - 44 IU/L Final         Passed - Last BP in normal range    BP Readings from Last 1 Encounters:  08/03/22 120/64         Passed - Valid encounter within last 12 months    Recent Outpatient Visits           2 months ago Gastroesophageal reflux disease with esophagitis without hemorrhage   [place] [facility] Merita Norton T, FNP   2 months ago COPD with acute exacerbation [facility])   [place] [facility] Merita Norton T, FNP   4 months ago Poor balance   [place] [facility] Merita Norton T, FNP   10 months ago Stage 3a chronic kidney disease [facility])   [place] [facility] Merita Norton T, FNP   1 year ago Stage 3a chronic kidney disease [facility])   [facility] Jacky Kindle, FNP       Future Appointments             In 1 month Agbor-Etang, Arlys John, MD [facility] [employer]   In 9 months Willeen Niece, MD [facility]

## 2022-08-11 ENCOUNTER — Telehealth: Payer: Self-pay | Admitting: Family Medicine

## 2022-08-11 NOTE — Telephone Encounter (Signed)
Received a fax from covermymeds for Sildenafil Citrate 20mg   Key: [facility]

## 2022-08-12 ENCOUNTER — Ambulatory Visit (INDEPENDENT_AMBULATORY_CARE_PROVIDER_SITE_OTHER): Payer: PPO

## 2022-08-12 VITALS — Ht 71.0 in | Wt 184.0 lb

## 2022-08-12 DIAGNOSIS — Z0000 Encounter for general adult medical examination without abnormal findings: Secondary | ICD-10-CM

## 2022-08-12 NOTE — Patient Instructions (Signed)
Edward Rodriguez , Thank you for taking time to come for your Medicare Wellness Visit. I appreciate your ongoing commitment to your health goals. Please review the following plan we discussed and let me know if I can assist you in the future.   These are the goals we discussed:  Goals      DIET - EAT MORE FRUITS AND VEGETABLES     Prevent falls     Recommend to remove any items from the home that may cause slips or trips.        This is a list of the screening recommended for you and due dates:  Health Maintenance  Topic Date Due   Zoster (Shingles) Vaccine (1 of 2) Never done   COVID-19 Vaccine (4 - 2023-24 season) 10/10/2021   Flu Shot  09/10/2022   Medicare Annual Wellness Visit  08/12/2023   DTaP/Tdap/Td vaccine (2 - Tdap) 09/01/2027   Pneumonia Vaccine  Completed   HPV Vaccine  Aged Out    Advanced directives: yes  Conditions/risks identified: none  Next appointment: Follow up in one year for your annual wellness visit. 08/17/2023 @ 9:45am telephone  Preventive Care 65 Years and Older, Male  Preventive care refers to lifestyle choices and visits with your health care provider that can promote health and wellness. What does preventive care include? A yearly physical exam. This is also called an annual well check. Dental exams once or twice a year. Routine eye exams. Ask your health care provider how often you should have your eyes checked. Personal lifestyle choices, including: Daily care of your teeth and gums. Regular physical activity. Eating a healthy diet. Avoiding tobacco and drug use. Limiting alcohol use. Practicing safe sex. Taking low doses of aspirin every day. Taking vitamin and mineral supplements as recommended by your health care provider. What happens during an annual well check? The services and screenings done by your health care provider during your annual well check will depend on your age, overall health, lifestyle risk factors, and family history of  disease. Counseling  Your health care provider may ask you questions about your: Alcohol use. Tobacco use. Drug use. Emotional well-being. Home and relationship well-being. Sexual activity. Eating habits. History of falls. Memory and ability to understand (cognition). Work and work Astronomer. Screening  You may have the following tests or measurements: Height, weight, and BMI. Blood pressure. Lipid and cholesterol levels. These may be checked every 5 years, or more frequently if you are over 84 years old. Skin check. Lung cancer screening. You may have this screening every year starting at age 88 if you have a 30-pack-year history of smoking and currently smoke or have quit within the past 15 years. Fecal occult blood test (FOBT) of the stool. You may have this test every year starting at age 31. Flexible sigmoidoscopy or colonoscopy. You may have a sigmoidoscopy every 5 years or a colonoscopy every 10 years starting at age 12. Prostate cancer screening. Recommendations will vary depending on your family history and other risks. Hepatitis C blood test. Hepatitis B blood test. Sexually transmitted disease (STD) testing. Diabetes screening. This is done by checking your blood sugar (glucose) after you have not eaten for a while (fasting). You may have this done every 1-3 years. Abdominal aortic aneurysm (AAA) screening. You may need this if you are a current or former smoker. Osteoporosis. You may be screened starting at age 33 if you are at high risk. Talk with your health care provider about your  test results, treatment options, and if necessary, the need for more tests. Vaccines  Your health care provider may recommend certain vaccines, such as: Influenza vaccine. This is recommended every year. Tetanus, diphtheria, and acellular pertussis (Tdap, Td) vaccine. You may need a Td booster every 10 years. Zoster vaccine. You may need this after age 56. Pneumococcal 13-valent  conjugate (PCV13) vaccine. One dose is recommended after age 27. Pneumococcal polysaccharide (PPSV23) vaccine. One dose is recommended after age 59. Talk to your health care provider about which screenings and vaccines you need and how often you need them. This information is not intended to replace advice given to you by your health care provider. Make sure you discuss any questions you have with your health care provider. Document Released: 02/22/2015 Document Revised: 10/16/2015 Document Reviewed: 11/27/2014 Elsevier Interactive Patient Education  2017 [employer] Prevention in the Home Falls can cause injuries. They can happen to people of all ages. There are many things you can do to make your home safe and to help prevent falls. What can I do on the outside of my home? Regularly fix the edges of walkways and driveways and fix any cracks. Remove anything that might make you trip as you walk through a door, such as a raised step or threshold. Trim any bushes or trees on the path to your home. Use bright outdoor lighting. Clear any walking paths of anything that might make someone trip, such as rocks or tools. Regularly check to see if handrails are loose or broken. Make sure that both sides of any steps have handrails. Any raised decks and porches should have guardrails on the edges. Have any leaves, snow, or ice cleared regularly. Use sand or salt on walking paths during winter. Clean up any spills in your garage right away. This includes oil or grease spills. What can I do in the bathroom? Use night lights. Install grab bars by the toilet and in the tub and shower. Do not use towel bars as grab bars. Use non-skid mats or decals in the tub or shower. If you need to sit down in the shower, use a plastic, non-slip stool. Keep the floor dry. Clean up any water that spills on the floor as soon as it happens. Remove soap buildup in the tub or shower regularly. Attach bath mats  securely with double-sided non-slip rug tape. Do not have throw rugs and other things on the floor that can make you trip. What can I do in the bedroom? Use night lights. Make sure that you have a light by your bed that is easy to reach. Do not use any sheets or blankets that are too big for your bed. They should not hang down onto the floor. Have a firm chair that has side arms. You can use this for support while you get dressed. Do not have throw rugs and other things on the floor that can make you trip. What can I do in the kitchen? Clean up any spills right away. Avoid walking on wet floors. Keep items that you use a lot in easy-to-reach places. If you need to reach something above you, use a strong step stool that has a grab bar. Keep electrical cords out of the way. Do not use floor polish or wax that makes floors slippery. If you must use wax, use non-skid floor wax. Do not have throw rugs and other things on the floor that can make you trip. What can I do with my  stairs? Do not leave any items on the stairs. Make sure that there are handrails on both sides of the stairs and use them. Fix handrails that are broken or loose. Make sure that handrails are as long as the stairways. Check any carpeting to make sure that it is firmly attached to the stairs. Fix any carpet that is loose or worn. Avoid having throw rugs at the top or bottom of the stairs. If you do have throw rugs, attach them to the floor with carpet tape. Make sure that you have a light switch at the top of the stairs and the bottom of the stairs. If you do not have them, ask someone to add them for you. What else can I do to help prevent falls? Wear shoes that: Do not have high heels. Have rubber bottoms. Are comfortable and fit you well. Are closed at the toe. Do not wear sandals. If you use a stepladder: Make sure that it is fully opened. Do not climb a closed stepladder. Make sure that both sides of the stepladder  are locked into place. Ask someone to hold it for you, if possible. Clearly mark and make sure that you can see: Any grab bars or handrails. First and last steps. Where the edge of each step is. Use tools that help you move around (mobility aids) if they are needed. These include: Canes. Walkers. Scooters. Crutches. Turn on the lights when you go into a dark area. Replace any light bulbs as soon as they burn out. Set up your furniture so you have a clear path. Avoid moving your furniture around. If any of your floors are uneven, fix them. If there are any pets around you, be aware of where they are. Review your medicines with your doctor. Some medicines can make you feel dizzy. This can increase your chance of falling. Ask your doctor what other things that you can do to help prevent falls. This information is not intended to replace advice given to you by your health care provider. Make sure you discuss any questions you have with your health care provider. Document Released: 11/22/2008 Document Revised: 07/04/2015 Document Reviewed: 03/02/2014 Elsevier Interactive Patient Education  2017 [employer].

## 2022-08-12 NOTE — Progress Notes (Signed)
Subjective:   Edward Rodriguez is a 82 y.o. male who presents for Medicare Annual/Subsequent preventive examination.  Visit Complete: Virtual  I connected with  Ty Hilts on 08/12/22 by a audio enabled telemedicine application and verified that I am speaking with the correct person using two identifiers.  Patient Location: Home  Provider Location: Home Office  I discussed the limitations of evaluation and management by telemedicine. The patient expressed understanding and agreed to proceed.  Patient Medicare AWV questionnaire was completed by the patient on (not done); I have confirmed that all information answered by patient is correct and no changes since this date.  Review of Systems    Cardiac Risk Factors include: advanced age (>7men, >61 women);male gender    Objective:    Today's Vitals   08/12/22 0938  Weight: 184 lb (83.5 kg)  Height: 5\' 11"  (1.803 m)   Body mass index is 25.66 kg/m.     08/12/2022    9:53 AM 04/07/2021   10:37 AM 09/19/2019    9:27 AM 07/16/2014    9:40 AM 06/27/2014    4:40 PM 06/19/2014    2:43 PM  Advanced Directives  Does Patient Have a Medical Advance Directive? Yes No Yes Yes Yes Yes  Type of Estate agent of [place];Living will;Out of facility DNR (pink MOST or yellow form)  Healthcare Power of [place];Living will Living will Healthcare Power of Attorney Living will  Does patient want to make changes to medical advance directive?    No - Patient declined  No - Patient declined  Copy of Healthcare Power of Attorney in Chart?   No - copy requested No - copy requested No - copy requested No - copy requested  Would patient like information on creating a medical advance directive?  Yes (Inpatient - patient defers creating a medical advance directive and declines information at this time)   No - patient declined information     Current Medications (verified) Outpatient Encounter Medications as of 08/12/2022  Medication Sig    albuterol (VENTOLIN HFA) 108 (90 Base) MCG/ACT inhaler INHALE 2 PUFFS BY MOUTH EVERY 6 HOURS AS NEEDED FOR WHEEZING AND FOR SHORTNESS OF BREATH   apixaban (ELIQUIS) 5 MG TABS tablet Take 1 tablet (5 mg total) by mouth 2 (two) times daily.   clindamycin (CLEOCIN T) 1 % external solution Apply topically daily. Qd to aa scalp for bumps   doxycycline (MONODOX) 100 MG capsule Take 1 capsule (100 mg total) by mouth daily. Take with food.   dupilumab (DUPIXENT) 300 MG/2ML prefilled syringe Inject 300 mg into the skin every 14 (fourteen) days. For maintenance.   ipratropium (ATROVENT) 0.03 % nasal spray Place 2 sprays into both nostrils every 12 (twelve) hours.   meloxicam (MOBIC) 15 MG tablet Take 15 mg by mouth daily.   omeprazole (PRILOSEC) 20 MG capsule Take 2 capsules (40 mg total) by mouth daily.   sildenafil (REVATIO) 20 MG tablet TAKE 2 TO 5 TABLETS by mouth once daily as needed to obtain/maintain erection. NEED APPOINTMENT   venlafaxine XR (EFFEXOR-XR) 150 MG 24 hr capsule TAKE 2 CAPSULES BY MOUTH ONCE DAILY WITH BREAKFAST   clobetasol (TEMOVATE) 0.05 % external solution MIX CLOBETASOL IN A JAR OR CERAVE CREAM. APPLY TO THE AFFECTED AREAS OF RASH TWICE DAILY UNTIL IMPROVED. AVOID FACE, GROIN, AXILLA (Patient not taking: Reported on 08/03/2022)   clobetasol (TEMOVATE) 0.05 % external solution Apply to affected areas scalp once to twice daily as needed for flares/itch. (Patient  not taking: Reported on 08/03/2022)   montelukast (SINGULAIR) 10 MG tablet Take 1 tablet (10 mg total) by mouth at bedtime. (Patient not taking: Reported on 06/22/2022)   No facility-administered encounter medications on file as of 08/12/2022.    Allergies (verified) Sulfa antibiotics and Elemental sulfur   History: Past Medical History:  Diagnosis Date   Actinic keratosis 07/26/2007   R dorsum index finger - bx proven    Actinic keratosis 01/25/2008   L inf helix - bx proven    Actinic keratosis 05/16/2009   L lat  canthus - bx proven    Anxiety    GERD (gastroesophageal reflux disease)    Vertigo 06/2014   had an episode in 2013.   Past Surgical History:  Procedure Laterality Date   ROTATOR CUFF REPAIR Left 06/2014   SHOULDER ARTHROSCOPY Left 06/27/2014   Procedure: ARTHROSCOPY SHOULDER- mini open rotator cuff repair;  Surgeon: Juanell Fairly, MD;  Location: [facility] ORS;  Service: Orthopedics;  Laterality: Left;   SHOULDER OPEN ROTATOR CUFF REPAIR Right 2011   Family History  Problem Relation Age of Onset   Kidney failure Mother        54s   Dementia Father    Heart disease Sister    Heart Problems Sister    Cancer Maternal Grandfather    Cancer Paternal Grandmother    Cancer Paternal Grandfather    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[employer]  Tobacco Use   Smoking status: Former    Types: Cigarettes    Quit date: 06/18/1968    Years since quitting: 54.1   Smokeless tobacco: Never  Vaping Use   Vaping Use: Never used  Substance and Sexual Activity   Alcohol use: Yes    Alcohol/week: 4.0 standard drinks of alcohol    Types: 4 Cans of beer per week    Comment: occasionally   Drug use: No   Sexual activity: Not on file  Other Topics Concern   Not on file  Social History Narrative   Not on file   Social Determinants of Health   Financial Resource Strain: Low Risk  (08/12/2022)   Overall Financial Resource Strain (CARDIA)    Difficulty of Paying Living Expenses: Not very hard  Food Insecurity: No Food Insecurity (08/12/2022)   Hunger Vital Sign    Worried About Running Out of Food in the Last Year: Never true    Ran Out of Food in the Last Year: Never true  Transportation Needs: No Transportation Needs (08/12/2022)   PRAPARE - Scientist, research (physical sciences) (Medical): No    Lack of Transportation (Non-Medical): No  Physical Activity: Sufficiently Active (08/12/2022)   Exercise Vital Sign    Days of Exercise per Week: 5 days    Minutes of Exercise per Session: 30 min  Stress: No Stress Concern Present (08/12/2022)   [employer] of Occupational Health - Occupational Stress Questionnaire    Feeling of Stress : Only a little  Social Connections: Moderately Integrated (08/12/2022)   Social Connection and Isolation Panel [NHANES]    Frequency of Communication with Friends and Family: Three times a week    Frequency of Social Gatherings with Friends and Family: Once  a week    Attends Religious Services: More than 4 times per year    Active Member of Clubs or Organizations: No    Attends Banker Meetings: Never    Marital Status: Married    Tobacco Counseling Counseling given: Not Answered   Clinical Intake:  Pre-visit preparation completed: Yes  Pain : No/denies pain     BMI - recorded: 25.66 Nutritional Status: BMI 25 -29 Overweight Nutritional Risks: Nausea/ vomitting/ diarrhea ("low grade" nausea in ZO:XWRU reflux meds help) Diabetes: No  How often do you need to have someone help you when you read instructions, pamphlets, or other written materials from your doctor or pharmacy?: 1 - Never  Interpreter Needed?: No  Comments: live with wife Information entered by :: B.Tenishia Ekman,LPN   Activities of Daily Living    08/12/2022    9:54 AM 05/19/2022    8:36 AM  In your present state of health, do you have any difficulty performing the following activities:  Hearing? 0 1  Vision? 0 0  Difficulty concentrating or making decisions? 1 1  Comment all his life problems concentrating   Walking or climbing stairs? 0 1  Dressing or bathing? 0 0  Doing errands, shopping? 0 0  Preparing Food and eating ? N   Using the Toilet? N   In the past six months, have you accidently leaked urine? N   Do you have  problems with loss of bowel control? N   Managing your Medications? N   Managing your Finances? N   Housekeeping or managing your Housekeeping? N     Patient Care Team: Jacky Kindle, FNP as PCP - General (Family Medicine) Debbe Odea, MD as PCP - Cardiology (Cardiology) Pa, [facility])  Indicate any recent Medical Services you may have received from other than [facility] providers in the past year (date may be approximate).     Assessment:   This is a routine wellness examination for [employer].  Hearing/Vision screen Hearing Screening - Comments:: Little hearing loss;working on paying for them Vision Screening - Comments:: Adequate vision w/glasses [facility]  Dietary issues and exercise activities discussed:     Goals Addressed             This Visit's Progress    DIET - EAT MORE FRUITS AND VEGETABLES   On track    Prevent falls   On track    Recommend to remove any items from the home that may cause slips or trips.       Depression Screen    08/12/2022    9:50 AM 05/19/2022    8:35 AM 09/23/2021    8:17 AM 07/10/2021   10:16 AM 04/07/2021   10:33 AM 09/05/2020    2:37 PM 09/19/2019    9:20 AM  PHQ 2/9 Scores  PHQ - 2 Score 0 0 0 2 0 0 0  PHQ- 9 Score  9 3 14   0     Fall Risk    08/12/2022    9:42 AM 05/19/2022    8:35 AM 03/31/2022    8:29 AM 09/23/2021    8:16 AM 07/10/2021   10:15 AM  Fall Risk   Falls in the past year? 0 0 1 1 1   Number falls in past yr: 0 1 1 1 1   Injury with Fall? 0 1 0 1 1  Risk for fall due to : No Fall Risks History of fall(s) History of fall(s);Impaired balance/gait  History  of fall(s)  Follow up Education provided;Falls prevention discussed    Falls evaluation completed    MEDICARE RISK AT HOME:  Medicare Risk at Home - 08/12/22 0943     Any stairs in or around the home? No    If so, are there any without handrails? No    Home free of loose throw rugs in walkways, pet beds, electrical cords, etc? Yes    Adequate  lighting in your home to reduce risk of falls? Yes    Life alert? No    Use of a cane, walker or w/c? No    Grab bars in the bathroom? Yes    Shower chair or bench in shower? No    Elevated toilet seat or a handicapped toilet? Yes             TIMED UP AND GO:  Was the test performed?  No    Cognitive Function:        08/12/2022    9:59 AM  6CIT Screen  What Year? 0 points  What month? 0 points  What time? 0 points  Count back from 20 0 points  Months in reverse 0 points  Repeat phrase 0 points  Total Score 0 points    Immunizations Immunization History  Administered Date(s) Administered   Influenza Split 12/01/2010   Influenza, High Dose Seasonal PF 11/04/2013, 11/11/2015, 11/06/2016, 10/15/2018, 10/15/2019   Influenza,inj,Quad PF,6+ Mos 10/22/2012   Influenza-Unspecified 10/20/2017   PFIZER(Purple Top)SARS-COV-2 Vaccination 03/02/2019, 03/23/2019, 01/22/2020   Pneumococcal Conjugate-13 07/13/2013   Pneumococcal Polysaccharide-23 12/01/2010   Td 08/31/2017   Zoster, Live 07/13/2013    TDAP status: Up to date  Flu Vaccine status: Up to date  Pneumococcal vaccine status: Up to date  Covid-19 vaccine status: Completed vaccines  Qualifies for Shingles Vaccine? Yes   Zostavax completed No   Shingrix Completed?: No.    Education has been provided regarding the importance of this vaccine. Patient has been advised to call insurance company to determine out of pocket expense if they have not yet received this vaccine. Advised may also receive vaccine at local pharmacy or Health Dept. Verbalized acceptance and understanding.  Screening Tests Health Maintenance  Topic Date Due   Zoster Vaccines- Shingrix (1 of 2) Never done   COVID-19 Vaccine (4 - 2023-24 season) 10/10/2021   INFLUENZA VACCINE  09/10/2022   Medicare Annual Wellness (AWV)  08/12/2023   DTaP/Tdap/Td (2 - Tdap) 09/01/2027   Pneumonia Vaccine 28+ Years old  Completed   HPV VACCINES  Aged Out     Health Maintenance  Health Maintenance Due  Topic Date Due   Zoster Vaccines- Shingrix (1 of 2) Never done   COVID-19 Vaccine (4 - 2023-24 season) 10/10/2021    Colorectal cancer screening: No longer required.   Lung Cancer Screening: (Low Dose CT Chest recommended if Age 36-80 years, 20 pack-year currently smoking OR have quit w/in 15years.) does not qualify.   Lung Cancer Screening Referral: no  Additional Screening:  Hepatitis C Screening: does not qualify; Completed yes  Vision Screening: Recommended annual ophthalmology exams for early detection of glaucoma and other disorders of the eye. Is the patient up to date with their annual eye exam?  Yes  Who is the provider or what is the name of the office in which the patient attends annual eye exams? [facility] If pt is not established with a provider, would they like to be referred to a provider to establish care? No .  Dental Screening: Recommended annual dental exams for proper oral hygiene  Diabetic Foot Exam: n/a  Community Resource Referral / Chronic Care Management: CRR required this visit?  No   CCM required this visit?  No     Plan:     I have personally reviewed and noted the following in the patient's chart:   Medical and social history Use of alcohol, tobacco or illicit drugs  Current medications and supplements including opioid prescriptions. Patient is not currently taking opioid prescriptions. Functional ability and status Nutritional status Physical activity Advanced directives List of other physicians Hospitalizations, surgeries, and ER visits in previous 12 months Vitals Screenings to include cognitive, depression, and falls Referrals and appointments  In addition, I have reviewed and discussed with patient certain preventive protocols, quality metrics, and best practice recommendations. A written personalized care plan for preventive services as well as general preventive health  recommendations were provided to patient.     Sue Lush, LPN   02/14/1094   After Visit Summary: (MyChart) Due to this being a telephonic visit, the after visit summary with patients personalized plan was offered to patient via MyChart   Nurse Notes: The patient states he is doing well and has no concerns or questions at this time.

## 2022-08-17 ENCOUNTER — Ambulatory Visit: Payer: PPO | Attending: Cardiology

## 2022-08-17 DIAGNOSIS — R0609 Other forms of dyspnea: Secondary | ICD-10-CM

## 2022-08-17 LAB — ECHOCARDIOGRAM COMPLETE
Area-P 1/2: 3.91 cm2
S' Lateral: 3.3 cm

## 2022-08-27 ENCOUNTER — Other Ambulatory Visit: Payer: Self-pay

## 2022-08-27 DIAGNOSIS — R0609 Other forms of dyspnea: Secondary | ICD-10-CM

## 2022-09-01 ENCOUNTER — Other Ambulatory Visit: Payer: PPO

## 2022-09-01 ENCOUNTER — Ambulatory Visit: Admission: RE | Admit: 2022-09-01 | Payer: PPO | Source: Ambulatory Visit

## 2022-09-02 ENCOUNTER — Other Ambulatory Visit: Payer: Self-pay | Admitting: Family Medicine

## 2022-09-02 DIAGNOSIS — F419 Anxiety disorder, unspecified: Secondary | ICD-10-CM

## 2022-09-04 ENCOUNTER — Ambulatory Visit: Admission: RE | Admit: 2022-09-04 | Payer: PPO | Source: Ambulatory Visit

## 2022-09-07 ENCOUNTER — Ambulatory Visit: Payer: PPO | Admitting: Cardiology

## 2022-09-10 ENCOUNTER — Ambulatory Visit
Admission: RE | Admit: 2022-09-10 | Discharge: 2022-09-10 | Disposition: A | Discharge status: Home / Self Care | Payer: PPO | Source: Ambulatory Visit | Attending: Cardiology | Admitting: Cardiology

## 2022-09-10 DIAGNOSIS — R0609 Other forms of dyspnea: Secondary | ICD-10-CM | POA: Diagnosis not present

## 2022-09-10 DIAGNOSIS — J984 Other disorders of lung: Secondary | ICD-10-CM | POA: Diagnosis not present

## 2022-09-10 DIAGNOSIS — R0602 Shortness of breath: Secondary | ICD-10-CM | POA: Diagnosis not present

## 2022-09-10 DIAGNOSIS — I2510 Atherosclerotic heart disease of native coronary artery without angina pectoris: Secondary | ICD-10-CM | POA: Diagnosis not present

## 2022-09-10 DIAGNOSIS — R918 Other nonspecific abnormal finding of lung field: Secondary | ICD-10-CM | POA: Diagnosis not present

## 2022-09-10 MED ORDER — IOHEXOL 350 MG/ML SOLN
75.0000 mL | Freq: Once | INTRAVENOUS | Status: AC | PRN
  Administered 2022-09-10: 75 mL via INTRAVENOUS

## 2022-09-14 ENCOUNTER — Other Ambulatory Visit: Payer: Self-pay | Admitting: Family Medicine

## 2022-09-14 ENCOUNTER — Encounter: Payer: Self-pay | Admitting: Family Medicine

## 2022-09-14 DIAGNOSIS — R5383 Other fatigue: Secondary | ICD-10-CM

## 2022-09-14 DIAGNOSIS — R0609 Other forms of dyspnea: Secondary | ICD-10-CM

## 2022-09-15 DIAGNOSIS — M180 Bilateral primary osteoarthritis of first carpometacarpal joints: Secondary | ICD-10-CM | POA: Diagnosis not present

## 2022-09-16 ENCOUNTER — Telehealth: Payer: Self-pay | Admitting: Cardiology

## 2022-09-16 NOTE — Telephone Encounter (Signed)
Patient is requesting for [place] to put in an order to schedule a stress test. Please advise.

## 2022-09-17 ENCOUNTER — Other Ambulatory Visit: Payer: Self-pay

## 2022-09-17 DIAGNOSIS — R0609 Other forms of dyspnea: Secondary | ICD-10-CM

## 2022-09-17 DIAGNOSIS — R079 Chest pain, unspecified: Secondary | ICD-10-CM

## 2022-09-17 NOTE — Telephone Encounter (Signed)
Looks like myoview is scheduled for 08/19

## 2022-09-21 ENCOUNTER — Other Ambulatory Visit: Payer: Self-pay | Admitting: Dermatology

## 2022-09-21 ENCOUNTER — Other Ambulatory Visit (INDEPENDENT_AMBULATORY_CARE_PROVIDER_SITE_OTHER): Payer: Self-pay | Admitting: Nurse Practitioner

## 2022-09-21 DIAGNOSIS — I8001 Phlebitis and thrombophlebitis of superficial vessels of right lower extremity: Secondary | ICD-10-CM

## 2022-09-21 DIAGNOSIS — L2081 Atopic neurodermatitis: Secondary | ICD-10-CM

## 2022-09-23 ENCOUNTER — Encounter (INDEPENDENT_AMBULATORY_CARE_PROVIDER_SITE_OTHER): Payer: Self-pay | Admitting: Nurse Practitioner

## 2022-09-23 ENCOUNTER — Ambulatory Visit (INDEPENDENT_AMBULATORY_CARE_PROVIDER_SITE_OTHER): Payer: PPO | Admitting: Nurse Practitioner

## 2022-09-23 ENCOUNTER — Ambulatory Visit (INDEPENDENT_AMBULATORY_CARE_PROVIDER_SITE_OTHER): Payer: PPO

## 2022-09-23 VITALS — BP 119/69 | HR 76 | Resp 16 | Wt 190.2 lb

## 2022-09-23 DIAGNOSIS — I8001 Phlebitis and thrombophlebitis of superficial vessels of right lower extremity: Secondary | ICD-10-CM

## 2022-09-24 ENCOUNTER — Other Ambulatory Visit: Payer: Self-pay | Admitting: Family Medicine

## 2022-09-24 DIAGNOSIS — N529 Male erectile dysfunction, unspecified: Secondary | ICD-10-CM

## 2022-09-24 NOTE — Progress Notes (Signed)
Subjective:    Patient ID: Edward Rodriguez, male    DOB: [birth year], 82 y.o.   MRN: [id number] Chief Complaint  Patient presents with   Follow-up    Ultrasound follow up    The patient presents to the office for evaluation of DVT.  DVT was identified at [facility] by Duplex ultrasound.  The initial symptoms were pain and swelling in the lower extremity.   The patient notes his right leg is much improved.  He states that the swelling is essentially gone.  The hard knots that he could feel have resolved.  The pain that was associated with this event is near illuminated.   The patient has not been using compression therapy at this point.   No SOB or pleuritic chest pains.  No cough or hemoptysis.   No recent shortening of the patient's walking distance or new symptoms consistent with claudication.  No history of rest pain symptoms. No ulcers or wounds of the lower extremities have occurred.   The patient denies amaurosis fugax or recent TIA symptoms. There are no recent neurological changes noted. No recent episodes of angina or shortness of breath documented.    Duplex ultrasound of the right lower extremity dated May 31, 2022 demonstrates normal deep venous system however, there is acute thrombus within the entire right great saphenous vein as well as the right small saphenous vein.  The thrombus does extend to the saphenofemoral junction.     Today, noninvasive studies show right complete resolution of the  right great saphenous veins superficial phlebitis.  No evidence of deep venous thrombosis noted.    Review of Systems  All other systems reviewed and are negative.      Objective:   Physical Exam Vitals reviewed.  HENT:     Head: Normocephalic.  Cardiovascular:     Rate and Rhythm: Normal rate.  Pulmonary:     Effort: Pulmonary effort is normal.  Skin:    General: Skin is warm and dry.  Neurological:     Mental Status: He is alert and oriented to person, place, and time.   Psychiatric:        Mood and Affect: Mood normal.        Behavior: Behavior normal.        Thought Content: Thought content normal.        Judgment: Judgment normal.     BP 119/69 (BP Location: Left Arm)   Pulse 76   Resp 16   Wt 190 lb 3.2 oz (86.3 kg)   BMI 26.53 kg/m   Past Medical History:  Diagnosis Date   Actinic keratosis 07/26/2007   R dorsum index finger - bx proven    Actinic keratosis 01/25/2008   L inf helix - bx proven    Actinic keratosis 05/16/2009   L lat canthus - bx proven    Anxiety    GERD (gastroesophageal reflux disease)    Vertigo 06/2014   had an episode in 2013.    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[employer]  Tobacco Use   Smoking status: Former    Current packs/day: 0.00    Types: Cigarettes    Quit date: 06/18/1968    Years since  quitting: 54.3   Smokeless tobacco: Never  Vaping Use   Vaping status: Never Used  Substance and Sexual Activity   Alcohol use: Yes    Alcohol/week: 4.0 standard drinks of alcohol    Types: 4 Cans of beer per week    Comment: occasionally   Drug use: No   Sexual activity: Not on file  Other Topics Concern   Not on file  Social History Narrative   Not on file   Social Determinants of Health   Financial Resource Strain: Low Risk  (08/12/2022)   Overall Financial Resource Strain (CARDIA)    Difficulty of Paying Living Expenses: Not very hard  Food Insecurity: No Food Insecurity (08/12/2022)   Hunger Vital Sign    Worried About Running Out of Food in the Last Year: Never true    Ran Out of Food in the Last Year: Never true  Transportation Needs: No Transportation Needs (08/12/2022)   PRAPARE - Administrator, Civil Service (Medical): No    Lack of Transportation  (Non-Medical): No  Physical Activity: Sufficiently Active (08/12/2022)   Exercise Vital Sign    Days of Exercise per Week: 5 days    Minutes of Exercise per Session: 30 min  Stress: No Stress Concern Present (08/12/2022)   [employer] of Occupational Health - Occupational Stress Questionnaire    Feeling of Stress : Only a little  Social Connections: Moderately Integrated (08/12/2022)   Social Connection and Isolation Panel [NHANES]    Frequency of Communication with Friends and Family: Three times a week    Frequency of Social Gatherings with Friends and Family: Once a week    Attends Religious Services: More than 4 times per year    Active Member of [employer] or Organizations: No    Attends Banker Meetings: Never    Marital Status: Married  Catering manager Violence: Not At Risk (08/12/2022)   Humiliation, Afraid, Rape, and Kick questionnaire    Fear of Current or Ex-Partner: No    Emotionally Abused: No    Physically Abused: No    Sexually Abused: No    Past Surgical History:  Procedure Laterality Date   ROTATOR CUFF REPAIR Left 06/2014   SHOULDER ARTHROSCOPY Left 06/27/2014   Procedure: ARTHROSCOPY SHOULDER- mini open rotator cuff repair;  Surgeon: Juanell Fairly, MD;  Location: [facility] ORS;  Service: Orthopedics;  Laterality: Left;   SHOULDER OPEN ROTATOR CUFF REPAIR Right 2011    Family History  Problem Relation Age of Onset   Kidney failure Mother        85s   Dementia Father    Heart disease Sister    Heart Problems Sister    Cancer Maternal Grandfather    Cancer Paternal Grandmother    Cancer Paternal Grandfather     Allergies  Allergen Reactions   Sulfa Antibiotics    Elemental Sulfur Rash       Latest Ref Rng & Units 07/10/2021   10:37 AM 05/09/2018    3:09 PM 06/19/2014    3:43 PM  CBC  WBC 3.4 - 10.8 x10E3/uL 6.3  7.5  7.7   Hemoglobin 13.0 - 17.7 g/dL 44.0  34.7  42.5   Hematocrit 37.5 - 51.0 % 41.0  40.8  45.5   Platelets 150 - 450 x10E3/uL  202  246  215       CMP     Component Value Date/Time   NA 139 07/10/2021 1037   K 4.5 07/10/2021 1037  CL 102 07/10/2021 1037   CO2 22 07/10/2021 1037   GLUCOSE 110 (H) 07/10/2021 1037   GLUCOSE 103 (H) 06/19/2014 1543   BUN 18 07/10/2021 1037   CREATININE 1.42 (H) 07/10/2021 1037   CALCIUM 9.5 07/10/2021 1037   PROT 6.6 07/10/2021 1037   ALBUMIN 4.3 07/10/2021 1037   AST 18 07/10/2021 1037   ALT 15 07/10/2021 1037   ALKPHOS 83 07/10/2021 1037   BILITOT 0.2 07/10/2021 1037   GFRNONAA 62 05/09/2018 1509   GFRAA 71 05/09/2018 1509     No results found.     Assessment & Plan:   1. Thrombophlebitis of superficial veins of right lower extremity Today the patient has noted resolution of superficial phlebitis without progression to a deep vein process.  We discussed that after having a superficial bite as there is always a little bit of a risk and so in order to try to help with medication the patient is advised to begin a daily baby aspirin once he finishes his supply of Eliquis.  Given resolution of symptoms we will have the patient follow-up with [place] on an as-needed basis if the symptoms return.   Current Outpatient Medications on File Prior to Visit  Medication Sig Dispense Refill   albuterol (VENTOLIN HFA) 108 (90 Base) MCG/ACT inhaler INHALE 2 PUFFS BY MOUTH EVERY 6 HOURS AS NEEDED FOR WHEEZING AND FOR SHORTNESS OF BREATH 18 g 0   apixaban (ELIQUIS) 5 MG TABS tablet Take 1 tablet (5 mg total) by mouth 2 (two) times daily. 60 tablet 3   clindamycin (CLEOCIN T) 1 % external solution Apply topically daily. Qd to aa scalp for bumps 60 mL 6   clobetasol (TEMOVATE) 0.05 % external solution MIX CLOBETASOL SOLUTION IN JAR OF CERAVE CREAM. APPLY TO AFFECTED AREAS OF RASH TWICE DAILY UNTIL IMPROVED. AVOID FACE, GROIN, AXILLA 50 mL 0   doxycycline (MONODOX) 100 MG capsule Take 1 capsule (100 mg total) by mouth daily. Take with food. 30 capsule 2   dupilumab (DUPIXENT) 300 MG/2ML  prefilled syringe Inject 300 mg into the skin every 14 (fourteen) days. For maintenance. 4 mL 5   ipratropium (ATROVENT) 0.03 % nasal spray Place 2 sprays into both nostrils every 12 (twelve) hours. 30 mL 12   meloxicam (MOBIC) 15 MG tablet Take 15 mg by mouth daily.     omeprazole (PRILOSEC) 20 MG capsule Take 2 capsules (40 mg total) by mouth daily. 60 capsule 11   sildenafil (REVATIO) 20 MG tablet TAKE 2 TO 5 TABLETS by mouth once daily as needed to obtain/maintain erection. NEED APPOINTMENT 20 tablet 0   venlafaxine XR (EFFEXOR-XR) 150 MG 24 hr capsule TAKE 2 CAPSULES BY MOUTH ONCE DAILY WITH BREAKFAST 180 capsule 0   clobetasol (TEMOVATE) 0.05 % external solution MIX CLOBETASOL IN A JAR OR CERAVE CREAM. APPLY TO THE AFFECTED AREAS OF RASH TWICE DAILY UNTIL IMPROVED. AVOID FACE, GROIN, AXILLA (Patient not taking: Reported on 08/03/2022) 50 mL 0   clobetasol (TEMOVATE) 0.05 % external solution Apply to affected areas scalp once to twice daily as needed for flares/itch. (Patient not taking: Reported on 08/03/2022) 50 mL 2   montelukast (SINGULAIR) 10 MG tablet Take 1 tablet (10 mg total) by mouth at bedtime. (Patient not taking: Reported on 06/22/2022) 30 tablet 3   No current facility-administered medications on file prior to visit.    There are no Patient Instructions on file for this visit. No follow-ups on file.   Georgiana Spinner, NP

## 2022-09-25 NOTE — Telephone Encounter (Signed)
Requested medications are due for refill today.  yes  Requested medications are on the active medications list.  yes  Last refill. 08/11/2022  Future visit scheduled.   no  Notes to clinic.  Labs are expired.    Requested Prescriptions  Pending Prescriptions Disp Refills   sildenafil (REVATIO) 20 MG tablet [Pharmacy Med Name: Sildenafil Citrate 20 MG Oral Tablet] 20 tablet 0    Sig: TAKE 2 TO 5 TABLETS by mouth once daily as needed to obtain/maintain erection. NEED APPOINTMENT     Urology: Erectile Dysfunction Agents Failed - 09/24/2022  2:21 PM      Failed - AST in normal range and within 360 days    AST  Date Value Ref Range Status  07/10/2021 18 0 - 40 IU/L Final         Failed - ALT in normal range and within 360 days    ALT  Date Value Ref Range Status  07/10/2021 15 0 - 44 IU/L Final         Passed - Last BP in normal range    BP Readings from Last 1 Encounters:  09/23/22 119/69         Passed - Valid encounter within last 12 months    Recent Outpatient Visits           4 months ago Gastroesophageal reflux disease with esophagitis without hemorrhage   [place] [facility] Merita Norton T, FNP   4 months ago COPD with acute exacerbation [facility])   [place] [facility] Merita Norton T, FNP   5 months ago Poor balance   [place] [facility] Merita Norton T, FNP   1 year ago Stage 3a chronic kidney disease [facility])   [place] [facility] Jacky Kindle, FNP   1 year ago Stage 3a chronic kidney disease [facility])   [place] [facility] Jacky Kindle, FNP       Future Appointments             In 7 months Willeen Niece, MD [facility] [place]

## 2022-09-28 ENCOUNTER — Encounter
Admission: RE | Admit: 2022-09-28 | Discharge: 2022-09-28 | Disposition: A | Discharge status: Home / Self Care | Payer: PPO | Source: Ambulatory Visit | Attending: Cardiology | Admitting: Cardiology

## 2022-09-28 DIAGNOSIS — R079 Chest pain, unspecified: Secondary | ICD-10-CM | POA: Insufficient documentation

## 2022-09-28 LAB — NM MYOCAR MULTI W/SPECT W/WALL MOTION / EF
Base ST Depression (mm): 0 mm
LV dias vol: 79 mL (ref 62–150)
LV sys vol: 39 mL
Nuc Stress EF: 51 %
Peak HR: 98 {beats}/min
Rest HR: 68 {beats}/min
Rest Nuclear Isotope Dose: 10.1 mCi
SDS: 2
SRS: 8
SSS: 3
ST Depression (mm): 0 mm
Stress Nuclear Isotope Dose: 32.7 mCi
TID: 1

## 2022-09-28 MED ORDER — FLUDEOXYGLUCOSE F - 18 (FDG) INJECTION: 10.0000 | Freq: Once | INTRAVENOUS | Status: DC | PRN

## 2022-09-28 MED ORDER — TECHNETIUM TC 99M TETROFOSMIN IV KIT
10.0000 | PACK | Freq: Once | INTRAVENOUS | Status: AC | PRN
  Administered 2022-09-28: 10.1 via INTRAVENOUS

## 2022-09-28 MED ORDER — TECHNETIUM TC 99M TETROFOSMIN IV KIT
30.0000 | PACK | Freq: Once | INTRAVENOUS | Status: AC | PRN
  Administered 2022-09-28: 32.66 via INTRAVENOUS

## 2022-09-28 MED ORDER — REGADENOSON 0.4 MG/5ML IV SOLN
0.4000 mg | Freq: Once | INTRAVENOUS | Status: AC
  Administered 2022-09-28: 0.4 mg via INTRAVENOUS

## 2022-10-12 ENCOUNTER — Other Ambulatory Visit: Payer: Self-pay | Admitting: Dermatology

## 2022-10-20 ENCOUNTER — Other Ambulatory Visit: Payer: Self-pay | Admitting: Family Medicine

## 2022-10-20 DIAGNOSIS — R0602 Shortness of breath: Secondary | ICD-10-CM

## 2022-11-04 ENCOUNTER — Other Ambulatory Visit: Payer: Self-pay | Admitting: Family Medicine

## 2022-11-04 DIAGNOSIS — N529 Male erectile dysfunction, unspecified: Secondary | ICD-10-CM

## 2022-11-09 ENCOUNTER — Other Ambulatory Visit: Payer: Self-pay | Admitting: Dermatology

## 2022-11-18 DIAGNOSIS — J069 Acute upper respiratory infection, unspecified: Secondary | ICD-10-CM | POA: Diagnosis not present

## 2022-11-18 DIAGNOSIS — R059 Cough, unspecified: Secondary | ICD-10-CM | POA: Diagnosis not present

## 2022-11-20 ENCOUNTER — Other Ambulatory Visit: Payer: Self-pay | Admitting: Family Medicine

## 2022-11-20 DIAGNOSIS — N529 Male erectile dysfunction, unspecified: Secondary | ICD-10-CM

## 2022-11-20 NOTE — Telephone Encounter (Signed)
Requested medication (s) are due for refill today -yes  Requested medication (s) are on the active medication list -yes  Future visit scheduled -no  Last refill: 09/25/22 #20  Notes to clinic: fails lab protocol- over 1 year-07/10/21   Requested Prescriptions  Pending Prescriptions Disp Refills   sildenafil (REVATIO) 20 MG tablet [Pharmacy Med Name: Sildenafil Citrate 20 MG Oral Tablet] 20 tablet 0    Sig: TAKE 2 TO 5 TABLETS BY MOUTH ONCE DAILY AS NEEDED TO  OBTAIN/MAINTAIN  ERECTION.  NEED  APPOINTMENT     Urology: Erectile Dysfunction Agents Failed - 11/20/2022  9:58 AM      Failed - AST in normal range and within 360 days    AST  Date Value Ref Range Status  07/10/2021 18 0 - 40 IU/L Final         Failed - ALT in normal range and within 360 days    ALT  Date Value Ref Range Status  07/10/2021 15 0 - 44 IU/L Final         Passed - Last BP in normal range    BP Readings from Last 1 Encounters:  09/23/22 119/69         Passed - Valid encounter within last 12 months    Recent Outpatient Visits           5 months ago Gastroesophageal reflux disease with esophagitis without hemorrhage   [facility], FNP   6 months ago COPD with acute exacerbation [facility])   [place] [facility] Mer[facility] T, FNP   7 months ago Poor balance   [place] [facility] Merita Norton T, FNP   1 year ago Stage 3a chronic kidney disease [facility])   [place] [facility] Merita Norton T, FNP   1 year ago Stage 3a chronic kidney disease [facility])    [facility] Jacky Kindle, FNP       Future Appointments             In 5 months Willeen Niece, MD [facility]               Requested Prescriptions  Pending Prescriptions Disp Refills   sildenafil (REVATIO) 20 MG tablet [Pharmacy Med Name: Sildenafil Citrate 20 MG Oral Tablet] 20 tablet 0    Sig: TAKE 2 TO 5  TABLETS BY MOUTH ONCE DAILY AS NEEDED TO  OBTAIN/MAINTAIN  ERECTION.  NEED  APPOINTMENT     Urology: Erectile Dysfunction Agents Failed - 11/20/2022  9:58 AM      Failed - AST in normal range and within 360 days    AST  Date Value Ref Range Status  07/10/2021 18 0 - 40 IU/L Final         Failed - ALT in normal range and within 360 days    ALT  Date Value Ref Range Status  07/10/2021 15 0 - 44 IU/L Final         Passed - Last BP in normal range    BP Readings from Last 1 Encounters:  09/23/22 119/69         Passed - Valid encounter within last 12 months    Recent Outpatient Visits           5 months ago Gastroesophageal reflux disease with esophagitis without hemorrhage   [facility] Jacky Kindle, FNP   6  months ago COPD with acute exacerbation [facility])   [place] [facility] Merita Norton T, FNP   7 months ago Poor balance   [place] [facility] Merita Norton T, FNP   1 year ago Stage 3a chronic kidney disease [facility])   [employer] [facility] Jack[facility], FNP   1 year ago Stage 3a chronic kidney disease [facility]    [facility], FNP       Future Appointments             In 5 months Willeen Niece, MD [facility] [place] [facility]

## 2022-11-25 ENCOUNTER — Ambulatory Visit (INDEPENDENT_AMBULATORY_CARE_PROVIDER_SITE_OTHER): Payer: PPO | Admitting: Family Medicine

## 2022-11-25 ENCOUNTER — Encounter: Payer: Self-pay | Admitting: Family Medicine

## 2022-11-25 VITALS — BP 130/82 | HR 84 | Ht 71.0 in | Wt 188.4 lb

## 2022-11-25 DIAGNOSIS — J40 Bronchitis, not specified as acute or chronic: Secondary | ICD-10-CM

## 2022-11-25 DIAGNOSIS — B9689 Other specified bacterial agents as the cause of diseases classified elsewhere: Secondary | ICD-10-CM | POA: Insufficient documentation

## 2022-11-25 DIAGNOSIS — Z87891 Personal history of nicotine dependence: Secondary | ICD-10-CM

## 2022-11-25 MED ORDER — AMOXICILLIN 875 MG PO TABS: 875.0000 mg | ORAL_TABLET | Freq: Two times a day (BID) | ORAL | 0 refills | Status: AC

## 2022-11-25 NOTE — Progress Notes (Signed)
Established patient visit   Patient: Edward Rodriguez   DOB: [birth year]   82 y.o. Male  MRN: [id number] Visit Date: 11/25/2022  Today's healthcare provider: Jacky Kindle, FNP  Introduced to nurse practitioner role and practice setting.  All questions answered.  Discussed provider/patient relationship and expectations.  Subjective    HPI HPI     Fatigue    Additional comments: Associated with body aches. Patient reports he has been having a hard time with bronchitis that started 2 Saturdays ago. Took otc medications since he was out of town and when he came home it developed into bronchitis so he went to walk in clinic and was given prednisone pack, benzonatate, and promethazine dm. Would like to know if their is more he can do      Last edited by Acey Lav, CMA on 11/25/2022 11:12 AM.      The patient, with a history of chronic bronchitis, presents with a recent bout of bronchitis that has been more severe than usual. They report that this is the first time they have experienced it this fall, and it was preceded by laryngitis. The symptoms began two weeks ago during a trip to [place], and despite treatment with steroids and cough medication, they have not improved. The patient reports feeling 'up against the wall,' still experiencing a hacking cough and fatigue. They have not been prescribed any antibiotics for this episode. The patient also mentions occasional severe earaches that improve with the use of a heating pad.  Medications: Outpatient Medications Prior to Visit  Medication Sig   albuterol (VENTOLIN HFA) 108 (90 Base) MCG/ACT inhaler INHALE 2 PUFFS BY MOUTH EVERY 6 HOURS AS NEEDED FOR WHEEZING AND FOR SHORTNESS OF BREATH   clindamycin (CLEOCIN T) 1 % external solution Apply topically daily. Qd to aa scalp for bumps   clobetasol (TEMOVATE) 0.05 % external solution Apply to affected areas scalp once to twice daily as needed for flares/itch.   doxycycline (MONODOX) 100 MG  capsule TAKE 1 CAPSULE BY MOUTH ONCE DAILY WITH FOOD   meloxicam (MOBIC) 15 MG tablet Take 15 mg by mouth daily.   omeprazole (PRILOSEC) 20 MG capsule Take 2 capsules (40 mg total) by mouth daily.   sildenafil (REVATIO) 20 MG tablet TAKE 2 TO 5 TABLETS by mouth once daily as needed to obtain/maintain erection.   venlafaxine XR (EFFEXOR-XR) 150 MG 24 hr capsule TAKE 2 CAPSULES BY MOUTH ONCE DAILY WITH BREAKFAST   apixaban (ELIQUIS) 5 MG TABS tablet Take 1 tablet (5 mg total) by mouth 2 (two) times daily. (Patient not taking: Reported on 11/25/2022)   clobetasol (TEMOVATE) 0.05 % external solution MIX CLOBETASOL IN A JAR OR CERAVE CREAM. APPLY TO THE AFFECTED AREAS OF RASH TWICE DAILY UNTIL IMPROVED. AVOID FACE, GROIN, AXILLA (Patient not taking: Reported on 08/03/2022)   clobetasol (TEMOVATE) 0.05 % external solution MIX CLOBETASOL SOLUTION IN JAR OF CERAVE CREAM. APPLY TO AFFECTED AREAS OF RASH TWICE DAILY UNTIL IMPROVED. AVOID FACE, GROIN, AXILLA (Patient not taking: Reported on 11/25/2022)   dupilumab (DUPIXENT) 300 MG/2ML prefilled syringe Inject 300 mg into the skin every 14 (fourteen) days. For maintenance. (Patient not taking: Reported on 11/25/2022)   ipratropium (ATROVENT) 0.03 % nasal spray Place 2 sprays into both nostrils every 12 (twelve) hours. (Patient not taking: Reported on 11/25/2022)   montelukast (SINGULAIR) 10 MG tablet Take 1 tablet (10 mg total) by mouth at bedtime. (Patient not taking: Reported on 06/22/2022)   No facility-administered medications prior  to visit.     Objective    BP 130/82 (BP Location: Left Arm, Patient Position: Sitting, Cuff Size: Normal)   Pulse 84   Ht 5\' 11"  (1.803 m)   Wt 188 lb 6.4 oz (85.5 kg)   SpO2 100%   BMI 26.28 kg/m   Physical Exam Vitals and nursing note reviewed.  Constitutional:      Appearance: Normal appearance. He is normal weight.  HENT:     Head: Normocephalic and atraumatic.     Right Ear: Tympanic membrane, ear canal and  external ear normal.     Left Ear: Tympanic membrane, ear canal and external ear normal.     Ears:     Comments: Excessive cerumen within R ear canal     Nose: Nose normal.     Mouth/Throat:     Mouth: Mucous membranes are moist.     Pharynx: Oropharynx is clear.  Eyes:     Conjunctiva/sclera: Conjunctivae normal.  Cardiovascular:     Rate and Rhythm: Normal rate and regular rhythm.     Pulses: Normal pulses.     Heart sounds: Normal heart sounds.  Pulmonary:     Effort: Pulmonary effort is normal.     Breath sounds: Wheezing and rhonchi present.  Musculoskeletal:        General: Normal range of motion.     Cervical back: Normal range of motion.  Skin:    General: Skin is warm and dry.     Capillary Refill: Capillary refill takes less than 2 seconds.  Neurological:     General: No focal deficit present.     Mental Status: He is alert and oriented to person, place, and time. Mental status is at baseline.  Psychiatric:        Mood and Affect: Mood normal.        Behavior: Behavior normal.        Thought Content: Thought content normal.        Judgment: Judgment normal.     No results found for any visits on 11/25/22.  Assessment & Plan    Bronchitis Persistent cough and fatigue despite treatment with steroids and cough medication. No improvement noted. Auscultation revealed wheezing and rales. -Start Amoxicillin 875mg  BID for 7 days. -Recommend over-the-counter Mucinex or Robitussin DM to thin mucus and facilitate expectoration. -Encourage increased fluid intake to further aid in mucus thinning.  Laryngitis Resolved. No current symptoms reported. -No further treatment needed at this time.  Excessive cerumen, Right ear -Stable, continue to monitor for recurrent backup or blockage and/or signs of infection   Follow-up as needed for persistent symptoms or if condition worsens.  Leilani Merl, FNP, have reviewed all documentation for this visit. The documentation on  11/25/22 for the exam, diagnosis, procedures, and orders are all accurate and complete.  Jacky Kindle, FNP  [facility] [phone] (phone) [phone] (fax)  [facility]

## 2022-11-26 ENCOUNTER — Other Ambulatory Visit: Payer: Self-pay | Admitting: Family Medicine

## 2022-11-26 DIAGNOSIS — F419 Anxiety disorder, unspecified: Secondary | ICD-10-CM

## 2022-11-26 NOTE — Telephone Encounter (Signed)
Requested medication (s) are due for refill today:yes  Requested medication (s) are on the active medication list:yes  Last refill:  09/03/22 #180  Future visit scheduled: no  Notes to clinic:  overdue labs   Requested Prescriptions  Pending Prescriptions Disp Refills   venlafaxine XR (EFFEXOR-XR) 150 MG 24 hr capsule [Pharmacy Med Name: Venlafaxine HCl ER 150 MG Oral Capsule Extended Release 24 Hour] 180 capsule 0    Sig: TAKE 2 CAPSULES BY MOUTH ONCE DAILY WITH BREAKFAST     Psychiatry: Antidepressants - SNRI - desvenlafaxine & venlafaxine Failed - 11/26/2022  9:40 AM      Failed - Cr in normal range and within 360 days    Creatinine, Ser  Date Value Ref Range Status  07/10/2021 1.42 (H) 0.76 - 1.27 mg/dL Final         Failed - Lipid Panel in normal range within the last 12 months    Cholesterol, Total  Date Value Ref Range Status  07/10/2021 175 100 - 199 mg/dL Final   LDL Chol Calc (NIH)  Date Value Ref Range Status  07/10/2021 97 0 - 99 mg/dL Final   HDL  Date Value Ref Range Status  07/10/2021 43 >39 mg/dL Final   Triglycerides  Date Value Ref Range Status  07/10/2021 203 (H) 0 - 149 mg/dL Final         Passed - Completed PHQ-2 or PHQ-9 in the last 360 days      Passed - Last BP in normal range    BP Readings from Last 1 Encounters:  11/25/22 130/82         Passed - Valid encounter within last 6 months    Recent Outpatient Visits           Yesterday Acute bacterial bronchitis   [facility] T, FNP   6 months ago Gastroesophageal reflux disease with esophagitis without hemorrhage   [facility] Mer[facility] T, FNP   6 months ago COPD with acute exacerbation [facility])   [place] [facility] Jacky Kindle, FNP   8 months ago Poor balance   [place] [facility] Merita Norton T, FNP   1 year ago Stage 3a chronic kidney disease [facility])   [place] [facility] Jacky Kindle, FNP       Future Appointments             In 5 months Willeen Niece, MD [facility]

## 2022-12-04 ENCOUNTER — Other Ambulatory Visit: Payer: Self-pay | Admitting: Dermatology

## 2022-12-04 DIAGNOSIS — L2081 Atopic neurodermatitis: Secondary | ICD-10-CM

## 2022-12-08 DIAGNOSIS — H04123 Dry eye syndrome of bilateral lacrimal glands: Secondary | ICD-10-CM | POA: Diagnosis not present

## 2022-12-08 DIAGNOSIS — M180 Bilateral primary osteoarthritis of first carpometacarpal joints: Secondary | ICD-10-CM | POA: Diagnosis not present

## 2022-12-14 ENCOUNTER — Other Ambulatory Visit: Payer: Self-pay

## 2022-12-14 DIAGNOSIS — L2081 Atopic neurodermatitis: Secondary | ICD-10-CM

## 2022-12-14 MED ORDER — DUPIXENT 300 MG/2ML ~~LOC~~ SOSY: 300.0000 mg | PREFILLED_SYRINGE | SUBCUTANEOUS | 5 refills | Status: DC

## 2022-12-14 NOTE — Progress Notes (Signed)
Fax received from [facility] for prescription refills.

## 2022-12-16 ENCOUNTER — Telehealth: Payer: Self-pay

## 2022-12-16 MED ORDER — DOXYCYCLINE HYCLATE 100 MG PO TABS: 100.0000 mg | ORAL_TABLET | Freq: Two times a day (BID) | ORAL | 0 refills | Status: AC

## 2022-12-16 NOTE — Telephone Encounter (Signed)
Left message to return my call and RX sent in. aw

## 2022-12-16 NOTE — Telephone Encounter (Signed)
Advised patient of Dr. Marca Ancona recommendations, and that Dorathy Daft RMA has already sent the Doxycycline to his preferred pharmacy.

## 2022-12-16 NOTE — Telephone Encounter (Signed)
Patient called asking if we could increase his doxycycline dosing. He states the folliculitis has not gotten worse, nor better.

## 2022-12-17 NOTE — Telephone Encounter (Signed)
Left 2nd message for patient to return my call. aw

## 2022-12-22 DIAGNOSIS — N281 Cyst of kidney, acquired: Secondary | ICD-10-CM | POA: Diagnosis not present

## 2022-12-22 DIAGNOSIS — N182 Chronic kidney disease, stage 2 (mild): Secondary | ICD-10-CM | POA: Diagnosis not present

## 2022-12-22 DIAGNOSIS — Z791 Long term (current) use of non-steroidal anti-inflammatories (NSAID): Secondary | ICD-10-CM | POA: Diagnosis not present

## 2022-12-22 LAB — PROTEIN / CREATININE RATIO, URINE: Creatinine, Urine: 93

## 2022-12-23 DIAGNOSIS — M25562 Pain in left knee: Secondary | ICD-10-CM | POA: Diagnosis not present

## 2022-12-23 DIAGNOSIS — M1712 Unilateral primary osteoarthritis, left knee: Secondary | ICD-10-CM | POA: Diagnosis not present

## 2022-12-29 ENCOUNTER — Ambulatory Visit: Payer: PPO | Admitting: Dermatology

## 2022-12-29 DIAGNOSIS — N1831 Chronic kidney disease, stage 3a: Secondary | ICD-10-CM | POA: Diagnosis not present

## 2022-12-29 DIAGNOSIS — F419 Anxiety disorder, unspecified: Secondary | ICD-10-CM | POA: Diagnosis not present

## 2022-12-29 DIAGNOSIS — K219 Gastro-esophageal reflux disease without esophagitis: Secondary | ICD-10-CM | POA: Diagnosis not present

## 2022-12-29 DIAGNOSIS — R809 Proteinuria, unspecified: Secondary | ICD-10-CM | POA: Diagnosis not present

## 2022-12-29 DIAGNOSIS — F32A Depression, unspecified: Secondary | ICD-10-CM | POA: Diagnosis not present

## 2022-12-29 DIAGNOSIS — N281 Cyst of kidney, acquired: Secondary | ICD-10-CM | POA: Diagnosis not present

## 2023-01-31 ENCOUNTER — Other Ambulatory Visit: Payer: Self-pay | Admitting: Family Medicine

## 2023-01-31 DIAGNOSIS — R0602 Shortness of breath: Secondary | ICD-10-CM

## 2023-02-22 ENCOUNTER — Telehealth: Payer: Self-pay | Admitting: Family Medicine

## 2023-02-22 NOTE — Telephone Encounter (Signed)
[place] faxed refill request for the following medications:  albuterol (VENTOLIN HFA) 108 (90 Base) MCG/ACT inhaler   Please advise.

## 2023-02-23 ENCOUNTER — Other Ambulatory Visit: Payer: Self-pay

## 2023-02-23 ENCOUNTER — Telehealth: Payer: Self-pay

## 2023-02-23 DIAGNOSIS — R0602 Shortness of breath: Secondary | ICD-10-CM

## 2023-02-23 MED ORDER — ALBUTEROL SULFATE HFA 108 (90 BASE) MCG/ACT IN AERS: 2.0000 | INHALATION_SPRAY | RESPIRATORY_TRACT | 0 refills | Status: DC | PRN

## 2023-02-23 NOTE — Telephone Encounter (Signed)
 Patient call requesting a sample of Dupixent, his rx has not been approved and his next dose is scheduled for Monday Jan 20  Okay for patient to stop by to get 1 sample of Dupixent 300mg /54ml syringe Lot [id number] 09/2024

## 2023-03-05 ENCOUNTER — Ambulatory Visit (INDEPENDENT_AMBULATORY_CARE_PROVIDER_SITE_OTHER): Payer: PPO

## 2023-03-05 DIAGNOSIS — M2142 Flat foot [pes planus] (acquired), left foot: Secondary | ICD-10-CM

## 2023-03-05 DIAGNOSIS — M2141 Flat foot [pes planus] (acquired), right foot: Secondary | ICD-10-CM | POA: Diagnosis not present

## 2023-03-05 DIAGNOSIS — M7672 Peroneal tendinitis, left leg: Secondary | ICD-10-CM | POA: Diagnosis not present

## 2023-03-05 DIAGNOSIS — M7752 Other enthesopathy of left foot: Secondary | ICD-10-CM

## 2023-03-05 NOTE — Progress Notes (Signed)
  Patient was present and evaluated for Custom molded foot orthotics. Patient will benefit from CFO's to provide total contact to BIL MLA's helping to balance and distribute body weight more evenly across BIL feet helping to reduce plantar pressure and pain. Orthotic will also encourage FF / RF alignment  Patient was scanned today and will return for fitting upon receipt

## 2023-03-08 ENCOUNTER — Telehealth: Payer: Self-pay | Admitting: Family Medicine

## 2023-03-08 ENCOUNTER — Other Ambulatory Visit: Payer: Self-pay | Admitting: Family Medicine

## 2023-03-08 DIAGNOSIS — F419 Anxiety disorder, unspecified: Secondary | ICD-10-CM

## 2023-03-08 MED ORDER — VENLAFAXINE HCL ER 150 MG PO CP24
300.0000 mg | ORAL_CAPSULE | Freq: Every day | ORAL | 0 refills | Status: DC
Start: 2023-03-08 — End: 2023-06-10

## 2023-03-08 NOTE — Telephone Encounter (Signed)
Received fax from [facility] on [address] for refills for Venlaxafine 150 mg. #180

## 2023-03-24 ENCOUNTER — Telehealth: Payer: Self-pay

## 2023-03-24 NOTE — Telephone Encounter (Signed)
Called pt to schedule orthotic pick up he is driving so he will be calling back to schedule an orthotic pick up

## 2023-03-25 ENCOUNTER — Telehealth: Payer: Self-pay

## 2023-03-25 ENCOUNTER — Other Ambulatory Visit: Payer: Self-pay | Admitting: Dermatology

## 2023-03-25 DIAGNOSIS — L2081 Atopic neurodermatitis: Secondary | ICD-10-CM

## 2023-03-25 NOTE — Telephone Encounter (Signed)
Left pt vm to schedule orthotic pick up

## 2023-03-26 ENCOUNTER — Telehealth: Payer: Self-pay

## 2023-03-26 NOTE — Telephone Encounter (Signed)
Left pt vm to let him know we have opened up time in [place]

## 2023-03-26 NOTE — Telephone Encounter (Signed)
Edward Rodriguez will be going to [place] with patel on 2/17

## 2023-04-05 ENCOUNTER — Telehealth: Payer: Self-pay | Admitting: Family Medicine

## 2023-04-05 NOTE — Telephone Encounter (Signed)
 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 Placed in provider box to complete 04/05/23 VM

## 2023-04-08 ENCOUNTER — Ambulatory Visit (INDEPENDENT_AMBULATORY_CARE_PROVIDER_SITE_OTHER): Payer: PPO | Admitting: Podiatry

## 2023-04-08 DIAGNOSIS — M7672 Peroneal tendinitis, left leg: Secondary | ICD-10-CM

## 2023-04-08 DIAGNOSIS — M2141 Flat foot [pes planus] (acquired), right foot: Secondary | ICD-10-CM

## 2023-04-08 DIAGNOSIS — M2142 Flat foot [pes planus] (acquired), left foot: Secondary | ICD-10-CM

## 2023-04-08 NOTE — Progress Notes (Signed)
 Orthotics were dispensed and they are functioning well no acute complaints if any foot and ankle issues she will come back to see

## 2023-04-12 ENCOUNTER — Encounter: Payer: PPO | Admitting: Family Medicine

## 2023-04-20 ENCOUNTER — Ambulatory Visit: Payer: PPO | Admitting: Podiatry

## 2023-04-20 ENCOUNTER — Ambulatory Visit (INDEPENDENT_AMBULATORY_CARE_PROVIDER_SITE_OTHER)

## 2023-04-20 DIAGNOSIS — Z01818 Encounter for other preprocedural examination: Secondary | ICD-10-CM | POA: Diagnosis not present

## 2023-04-20 DIAGNOSIS — M216X2 Other acquired deformities of left foot: Secondary | ICD-10-CM

## 2023-04-20 NOTE — Progress Notes (Signed)
 Subjective:  Patient ID: Edward Rodriguez, male    DOB: January 25, 1941,  MRN: [id number]  Chief Complaint  Patient presents with   Foot Pain    Pt stated that he is having a lot of discomfort with his left foot     83 y.o. male presents with the above complaint.  Patient presents with complaint of left plantarflexed fifth metatarsal painful to touch is progressive gotten worse worse with ambulation worse with pressure patient would like to discuss treatment options for it.  He denies seeing anyone else prior to seeing me pain scale 7 out of 10 dull achy in nature he has tried debridement and offloading nothing has helped.  He would like to discuss surgical options at this time.  He denies being diabetic.   Review of Systems: Negative except as noted in the HPI. Denies N/V/F/Ch.  Past Medical History:  Diagnosis Date   Actinic keratosis 07/26/2007   R dorsum index finger - bx proven    Actinic keratosis 01/25/2008   L inf helix - bx proven    Actinic keratosis 05/16/2009   L lat canthus - bx proven    Anxiety    GERD (gastroesophageal reflux disease)    Vertigo 06/2014   had an episode in 2013.    Current Outpatient Medications:    albuterol (VENTOLIN HFA) 108 (90 Base) MCG/ACT inhaler, Inhale 2 puffs into the lungs every 4 (four) hours as needed for wheezing or shortness of breath., Disp: 18 g, Rfl: 0   apixaban (ELIQUIS) 5 MG TABS tablet, Take 1 tablet (5 mg total) by mouth 2 (two) times daily. (Patient not taking: Reported on 11/25/2022), Disp: 60 tablet, Rfl: 3   clobetasol (TEMOVATE) 0.05 % external solution, MIX CLOBETASOL SOLUTION IN A JAR OF CERAVE CREAM. APPLY TO AFFECTED AREAS OF RASH TWICE DAILY UNTIL IMPROVED. AVOID FACE, GROIN, AND AXILLA, Disp: 50 mL, Rfl: 0   doxycycline (MONODOX) 100 MG capsule, TAKE 1 CAPSULE BY MOUTH ONCE DAILY WITH FOOD, Disp: 30 capsule, Rfl: 3   dupilumab (DUPIXENT) 300 MG/2ML prefilled syringe, Inject 300 mg into the skin every 14 (fourteen) days. For  maintenance., Disp: 4 mL, Rfl: 5   ipratropium (ATROVENT) 0.03 % nasal spray, Place 2 sprays into both nostrils every 12 (twelve) hours. (Patient not taking: Reported on 11/25/2022), Disp: 30 mL, Rfl: 12   meloxicam (MOBIC) 15 MG tablet, Take 15 mg by mouth daily., Disp: , Rfl:    montelukast (SINGULAIR) 10 MG tablet, Take 1 tablet (10 mg total) by mouth at bedtime. (Patient not taking: Reported on 06/22/2022), Disp: 30 tablet, Rfl: 3   omeprazole (PRILOSEC) 20 MG capsule, Take 2 capsules (40 mg total) by mouth daily., Disp: 60 capsule, Rfl: 11   sildenafil (REVATIO) 20 MG tablet, TAKE 2 TO 5 TABLETS by mouth once daily as needed to obtain/maintain erection., Disp: 20 tablet, Rfl: 11   venlafaxine XR (EFFEXOR-XR) 150 MG 24 hr capsule, Take 2 capsules (300 mg total) by mouth daily with breakfast. TAKE 2 CAPSULES BY MOUTH ONCE DAILY WITH BREAKFAST, Disp: 180 capsule, Rfl: 0  Social History   Tobacco Use  Smoking Status Former   Current packs/day: 0.00   Types: Cigarettes   Quit date: 06/18/1968   Years since quitting: 54.8  Smokeless Tobacco Never    Allergies  Allergen Reactions   Sulfa Antibiotics    Elemental Sulfur Rash   Objective:  There were no vitals filed for this visit. There is no height or weight on  file to calculate BMI. Constitutional Well developed. Well nourished.  Vascular Dorsalis pedis pulses palpable bilaterally. Posterior tibial pulses palpable bilaterally. Capillary refill normal to all digits.  No cyanosis or clubbing noted. Pedal hair growth normal.  Neurologic Normal speech. Oriented to person, place, and time. Epicritic sensation to light touch grossly present bilaterally.  Dermatologic Nails well groomed and normal in appearance. No open wounds. No skin lesions.  Orthopedic: Left plantarflexed fifth metatarsal noted pain on palpation submetatarsal 5.  Central nucleated core with hyperkeratotic lesion noted   Radiographs: 3 views of skeletally mature  adult left foot: Plantarflexed fifth metatarsal noted mild tailor's bunion deformity noted.  Negative negative metatarsal height noted Assessment:   1. Plantar flexed metatarsal, left    Plan:  Patient was evaluated and treated and all questions answered.  Left plantarflexed fifth metatarsal -All questions and concerns were discussed with the patient in extensive detail -Given the amount of pain that is having a benefit from surgical floating osteotomy of the fifth metatarsal to take the pressure off the submetatarsal 5.  I discussed my procedure in extensive detail I discussed my preoperative intra postop plan with the patient extensive detail he states understanding like to proceed with surgery -Informed surgical risk consent was reviewed and read aloud to the patient.  I reviewed the films.  I have discussed my findings with the patient in great detail.  I have discussed all risks including but not limited to infection, stiffness, scarring, limp, disability, deformity, damage to blood vessels and nerves, numbness, poor healing, need for braces, arthritis, chronic pain, amputation, death.  All benefits and realistic expectations discussed in great detail.  I have made no promises as to the outcome.  I have provided realistic expectations.  I have offered the patient a 2nd opinion, which they have declined and assured me they preferred to proceed despite the risks   No follow-ups on file.

## 2023-04-26 ENCOUNTER — Telehealth: Payer: Self-pay | Admitting: Urology

## 2023-04-26 NOTE — Telephone Encounter (Signed)
 Received sx papers from [place] location, LM for pt to call back to schedule sx with Dr. Allena Katz.

## 2023-04-27 ENCOUNTER — Telehealth: Payer: Self-pay | Admitting: Podiatry

## 2023-04-27 NOTE — Telephone Encounter (Signed)
 Pt left message today at 949am stating he received a call to schedule surgery with Dr Allena Katz but is wanting to hold off for a couple of months. 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 He is not sure how long the recovery will be from the surgery and how long it would be after surgery before he could drive. Please advise

## 2023-05-10 ENCOUNTER — Ambulatory Visit: Payer: PPO | Admitting: Dermatology

## 2023-05-10 ENCOUNTER — Encounter: Payer: Self-pay | Admitting: Dermatology

## 2023-05-10 DIAGNOSIS — Z79899 Other long term (current) drug therapy: Secondary | ICD-10-CM

## 2023-05-10 DIAGNOSIS — L578 Other skin changes due to chronic exposure to nonionizing radiation: Secondary | ICD-10-CM

## 2023-05-10 DIAGNOSIS — L739 Follicular disorder, unspecified: Secondary | ICD-10-CM | POA: Diagnosis not present

## 2023-05-10 DIAGNOSIS — W908XXA Exposure to other nonionizing radiation, initial encounter: Secondary | ICD-10-CM | POA: Diagnosis not present

## 2023-05-10 DIAGNOSIS — L209 Atopic dermatitis, unspecified: Secondary | ICD-10-CM | POA: Diagnosis not present

## 2023-05-10 DIAGNOSIS — L821 Other seborrheic keratosis: Secondary | ICD-10-CM

## 2023-05-10 DIAGNOSIS — L2081 Atopic neurodermatitis: Secondary | ICD-10-CM

## 2023-05-10 DIAGNOSIS — L570 Actinic keratosis: Secondary | ICD-10-CM

## 2023-05-10 MED ORDER — CLOBETASOL PROPIONATE 0.05 % EX SOLN
CUTANEOUS | 1 refills | Status: DC
Start: 2023-05-10 — End: 2023-07-20

## 2023-05-10 MED ORDER — DUPIXENT 300 MG/2ML ~~LOC~~ SOSY
300.0000 mg | PREFILLED_SYRINGE | SUBCUTANEOUS | 5 refills | Status: DC
Start: 2023-05-10 — End: 2023-10-12

## 2023-05-10 NOTE — Progress Notes (Signed)
 Follow-Up Visit   Subjective  Edward Rodriguez is a 83 y.o. male who presents for the following: Atopic dermatitis >45m f/u Clobetasol/cerave mis, Dupixent 300mg /58ml sq injections q 2 wks, recent breaking out on L flank with itching, Folliculitis scalp, cIindamycin sol ~2x/wk, Doxycycline 100mg  1 po qd The patient has spots, moles and lesions to be evaluated, some may be new or changing and the patient may have concern these could be cancer.   The following portions of the chart were reviewed this encounter and updated as appropriate: medications, allergies, medical history  Review of Systems:  No other skin or systemic complaints except as noted in HPI or Assessment and Plan.  Objective  Well appearing patient in no apparent distress; mood and affect are within normal limits.   A focused examination was performed of the following areas: Trunk, scalp, arms  Relevant exam findings are noted in the Assessment and Plan.  R hand x 1, R forearm x 1, R upper arm x 1, L hand x 1, L forearm x 1, L upper arm x 3, L neck x 1 (9) Keratotic papules  Assessment & Plan   ATOPIC DERMATITIS trunk Exam: light pink papules L flank, L mid lower back, pink papules with excoriations sacrum 4% BSA on treatment  Chronic and persistent condition with duration or expected duration over one year. Condition is improving with treatment but not currently at goal. Overall much improved from baseline.   Atopic dermatitis - Severe, on Dupixent (biologic medication).  Atopic dermatitis (eczema) is a chronic, relapsing, pruritic condition that can significantly affect quality of life. It is often associated with allergic rhinitis and/or asthma and can require treatment with topical medications, phototherapy, or in severe cases biologic medications, which require long term medication management.    Treatment Plan: Cont Dupixent 300mg /43ml sq injections q 2 wks Cont Clobetasol / Cerave mix qd/bid prn flares, avoid  f/g/a, discussed with patient to add the whole 50ml bottle to 1 tub of cerave cream, only apply to aas of skin, Caution skin atrophy with long-term use.  Recommend gentle skin care.   Dupilumab (Dupixent) is a treatment given by injection for adults and children with moderate-to-severe atopic dermatitis. Goal is control of skin condition, not cure. It is given as 2 injections at the first dose followed by 1 injection ever 2 weeks thereafter.  Young children are dosed monthly.  Potential side effects include allergic reaction, herpes infections, injection site reactions and conjunctivitis (inflammation of the eyes).  The use of Dupixent requires long term medication management, including periodic office visits.  Topical steroids (such as triamcinolone, fluocinolone, fluocinonide, mometasone, clobetasol, halobetasol, betamethasone, hydrocortisone) can cause thinning and lightening of the skin if they are used for too long in the same area. Your physician has selected the right strength medicine for your problem and area affected on the body. Please use your medication only as directed by your physician to prevent side effects.    FOLLICULITIS scalp Exam: few scattered crusted papules scalp  Chronic and persistent condition with duration or expected duration over one year. Condition is improving with treatment but not currently at goal.   Folliculitis occurs due to inflammation of the superficial hair follicle (pore), resulting in acne-like lesions (pus bumps). It can be infectious (bacterial, fungal) or noninfectious (shaving, tight clothing, heat/sweat, medications).  Folliculitis can be acute or chronic and recommended treatment depends on the underlying cause of folliculitis.  Treatment Plan: Decrease Doxycycline to Doxycycline 100mg  1 po 3x/wk, take  with food and drink Cont Clobetasol sol qd/bid aa scalp prn flares, avoid f/g/a Cont Clindamycin sol qd to scalp  Doxycycline should be taken  with food to prevent nausea. Do not lay down for 30 minutes after taking. Be cautious with sun exposure and use good sun protection while on this medication. Pregnant women should not take this medication.   Topical steroids (such as triamcinolone, fluocinolone, fluocinonide, mometasone, clobetasol, halobetasol, betamethasone, hydrocortisone) can cause thinning and lightening of the skin if they are used for too long in the same area. Your physician has selected the right strength medicine for your problem and area affected on the body. Please use your medication only as directed by your physician to prevent side effects.   Long term medication management.  Patient is using long term (months to years) prescription medication  to control their dermatologic condition.  These medications require periodic monitoring to evaluate for efficacy and side effects and may require periodic laboratory monitoring.  SEBORRHEIC KERATOSIS - Stuck-on, waxy, tan-brown papules trunk/extremities - Benign-appearing - Discussed benign etiology and prognosis. - Observe - Call for any changes  ACTINIC DAMAGE - chronic, secondary to cumulative UV radiation exposure/sun exposure over time - diffuse scaly erythematous macules with underlying dyspigmentation - Recommend daily broad spectrum sunscreen SPF 30+ to sun-exposed areas, reapply every 2 hours as needed.  - Recommend staying in the shade or wearing long sleeves, sun glasses (UVA+UVB protection) and wide brim hats (4-inch brim around the entire circumference of the hat). - Call for new or changing lesions.  HYPERTROPHIC ACTINIC KERATOSIS (9) R hand x 1, R forearm x 1, R upper arm x 1, L hand x 1, L forearm x 1, L upper arm x 3, L neck x 1 (9) Vs ISKs  Destruction of lesion - R hand x 1, R forearm x 1, R upper arm x 1, L hand x 1, L forearm x 1, L upper arm x 3, L neck x 1 (9)  Destruction method: cryotherapy   Informed consent: discussed and consent obtained    Lesion destroyed using liquid nitrogen: Yes   Region frozen until ice ball extended beyond lesion: Yes   Outcome: patient tolerated procedure well with no complications   Post-procedure details: wound care instructions given   Additional details:  Prior to procedure, discussed risks of blister formation, small wound, skin dyspigmentation, or rare scar following cryotherapy. Recommend Vaseline ointment to treated areas while healing.  ATOPIC NEURODERMATITIS   Related Medications clobetasol (TEMOVATE) 0.05 % external solution MIX CLOBETASOL SOLUTION IN A JAR OF CERAVE CREAM. APPLY TO AFFECTED AREAS OF ECZEMA ONE TO TWO TIMES DAILY UNTIL IMPROVED. AVOID FACE, GROIN, AND AXILLA dupilumab (DUPIXENT) 300 MG/2ML prefilled syringe Inject 300 mg into the skin every 14 (fourteen) days. For maintenance.  Return in about 6 months (around 11/09/2023) for Atopic Derm, AK f/u.  I, Ardis Rowan, RMA, am acting as scribe for Willeen Niece, MD .  Documentation: I have reviewed the above documentation for accuracy and completeness, and I agree with the above.  Willeen Niece, MD

## 2023-05-10 NOTE — Patient Instructions (Addendum)
 Cryotherapy Aftercare  Wash gently with soap and water everyday.   Apply Vaseline and Band-Aid daily until healed.   Eczema Skin Care  Buy TWO 16oz jars of CeraVe moisturizing cream  [facility], [facility], [facility] (no prescription needed)  Costs about $15 per jar   Jar #1: Use as a moisturizer as needed. Can be applied to any area of the body. Use twice daily to unaffected areas.  Jar #2: Pour one 50ml bottle of clobetasol 0.05% solution into jar, mix well. Label this jar to indicate the medication has been added. Use one to two times daily to affected areas. Do not apply to face, groin or underarms.  Moisturizer may burn or sting initially. Try for at least 4 weeks.     Due to recent changes in healthcare laws, you may see results of your pathology and/or laboratory studies on MyChart before the doctors have had a chance to review them. We understand that in some cases there may be results that are confusing or concerning to you. Please understand that not all results are received at the same time and often the doctors may need to interpret multiple results in order to provide you with the best plan of care or course of treatment. Therefore, we ask that you please give [place] 2 business days to thoroughly review all your results before contacting the office for clarification. Should we see a critical lab result, you will be contacted sooner.   If You Need Anything After Your Visit  If you have any questions or concerns for your doctor, please call our main line at [phone] and press option 4 to reach your doctor's medical assistant. If no one answers, please leave a voicemail as directed and we will return your call as soon as possible. Messages left after 4 pm will be answered the following business day.   You may also send [place] a message via MyChart. We typically respond to MyChart messages within 1-2 business days.  For prescription refills, please ask your pharmacy to contact our office. Our  fax number is [phone].  If you have an urgent issue when the clinic is closed that cannot wait until the next business day, you can page your doctor at the number below.    Please note that while we do our best to be available for urgent issues outside of office hours, we are not available 24/7.   If you have an urgent issue and are unable to reach [place], you may choose to seek medical care at your doctor's office, retail clinic, urgent care center, or emergency room.  If you have a medical emergency, please immediately call 911 or go to the emergency department.  Pager Numbers  - Dr. Gwen Pounds: [phone]  - Dr. Roseanne Reno: [phone]  - Dr. Katrinka Blazing: [phone]   In the event of inclement weather, please call our main line at [phone] for an update on the status of any delays or closures.  Dermatology Medication Tips: Please keep the boxes that topical medications come in in order to help keep track of the instructions about where and how to use these. Pharmacies typically print the medication instructions only on the boxes and not directly on the medication tubes.   If your medication is too expensive, please contact our office at [phone] option 4 or send [place] a message through MyChart.   We are unable to tell what your co-pay for medications will be in advance as this is different depending on your insurance coverage. However,  we may be able to find a substitute medication at lower cost or fill out paperwork to get insurance to cover a needed medication.   If a prior authorization is required to get your medication covered by your insurance company, please allow [place] 1-2 business days to complete this process.  Drug prices often vary depending on where the prescription is filled and some pharmacies may offer cheaper prices.  The website www.goodrx.com contains coupons for medications through different pharmacies. The prices here do not account for what the cost may be with  help from insurance (it may be cheaper with your insurance), but the website can give you the price if you did not use any insurance.  - You can print the associated coupon and take it with your prescription to the pharmacy.  - You may also stop by our office during regular business hours and pick up a GoodRx coupon card.  - If you need your prescription sent electronically to a different pharmacy, notify our office through [facility] or by phone at [phone] option 4.     Si Usted Necesita Algo Despus de Su Visita  Tambin puede enviarnos un mensaje a travs de Clinical cytogeneticist. Por lo general respondemos a los mensajes de MyChart en el transcurso de 1 a 2 das hbiles.  Para renovar recetas, por favor pida a su farmacia que se ponga en contacto con nuestra oficina. Annie Sable de fax es [place] [phone].  Si tiene un asunto urgente cuando la clnica est cerrada y que no puede esperar hasta el siguiente da hbil, puede llamar/localizar a su doctor(a) al nmero que aparece a continuacin.   Por favor, tenga en cuenta que aunque hacemos todo lo posible para estar disponibles para asuntos urgentes fuera del horario de [place], no estamos disponibles las 24 horas del da, los 7 [address]  [address] de la [place].   Si tiene un problema urgente y no puede comunicarse con nosotros, puede optar por buscar atencin mdica  en el consultorio de su doctor(a), en una clnica privada, en un centro de atencin urgente o en una sala de emergencias.  Si tiene Engineer, drilling, por favor llame inmediatamente al 911 o vaya a la sala de emergencias.  Nmeros de bper  - Dr. Gwen Pounds: [phone]  - Dra. Roseanne Reno: [phone]  - Dr. Katrinka Blazing: [phone]   En caso de inclemencias del tiempo, por favor llame a Lacy Duverney principal al [phone] para una actualizacin sobre el [place] de cualquier retraso o cierre.  Consejos para la medicacin en dermatologa: Por favor, guarde las cajas en las que  vienen los medicamentos de uso tpico para ayudarle a seguir las instrucciones sobre dnde y cmo usarlos. Las farmacias generalmente imprimen las instrucciones del medicamento slo en las cajas y no directamente en los tubos del [place].   Si su medicamento es muy caro, por favor, pngase en contacto con Rolm Gala llamando al [phone] y presione la opcin 4 o envenos un mensaje a travs de Clinical cytogeneticist.   No podemos decirle cul ser su copago por los medicamentos por adelantado ya que esto es diferente dependiendo de la cobertura de su seguro. Sin embargo, es posible que podamos encontrar un medicamento sustituto a Audiological scientist un formulario para que el seguro cubra el medicamento que se considera necesario.   Si se requiere una autorizacin previa para que su compaa de seguros [place] su medicamento, por favor permtanos de 1 a 2 das hbiles para completar [address]  Los precios de  los medicamentos varan con frecuencia dependiendo del lugar de dnde se surte la receta y alguna farmacias pueden ofrecer precios ms baratos.  El sitio web www.goodrx.com tiene cupones para medicamentos de Health and safety inspector. Los precios aqu no tienen en cuenta lo que podra costar con la ayuda del seguro (puede ser ms barato con su seguro), pero el sitio web puede darle el precio si no utiliz Tourist information centre manager.  - Puede imprimir el cupn correspondiente y llevarlo con su receta a la farmacia.  - Tambin puede pasar por nuestra oficina durante el horario de atencin regular y Education officer, museum una tarjeta de cupones de GoodRx.  - Si necesita que su receta se enve electrnicamente a una farmacia diferente, informe a nuestra oficina a travs de MyChart de [place] o por telfono llamando al [phone] y presione la opcin 4.

## 2023-05-11 ENCOUNTER — Ambulatory Visit: Admitting: Podiatry

## 2023-05-11 DIAGNOSIS — M216X2 Other acquired deformities of left foot: Secondary | ICD-10-CM | POA: Diagnosis not present

## 2023-05-11 NOTE — Progress Notes (Signed)
 Subjective:  Patient ID: Edward Rodriguez, male    DOB: 02/20/1940,  MRN: [id number]  Chief Complaint  Patient presents with   Plantar flexed metatarsal, left    Plantar flexed metatarsal, left    83 y.o. male presents with the above complaint.  Patient presents with complaint of left plantarflexed fifth metatarsal painful to touch is progressive gotten worse worse with ambulation worse with pressure patient would like to discuss treatment options for it.  He denies seeing anyone else prior to seeing me pain scale 7 out of 10 dull achy in nature he has tried debridement and offloading nothing has helped.  He would like to discuss surgical options at this time.  He denies being diabetic.   Review of Systems: Negative except as noted in the HPI. Denies N/V/F/Ch.  Past Medical History:  Diagnosis Date   Actinic keratosis 07/26/2007   R dorsum index finger - bx proven    Actinic keratosis 01/25/2008   L inf helix - bx proven    Actinic keratosis 05/16/2009   L lat canthus - bx proven    Anxiety    GERD (gastroesophageal reflux disease)    Vertigo 06/2014   had an episode in 2013.    Current Outpatient Medications:    albuterol (VENTOLIN HFA) 108 (90 Base) MCG/ACT inhaler, Inhale 2 puffs into the lungs every 4 (four) hours as needed for wheezing or shortness of breath., Disp: 18 g, Rfl: 0   apixaban (ELIQUIS) 5 MG TABS tablet, Take 1 tablet (5 mg total) by mouth 2 (two) times daily. (Patient not taking: Reported on 11/25/2022), Disp: 60 tablet, Rfl: 3   clobetasol (TEMOVATE) 0.05 % external solution, MIX CLOBETASOL SOLUTION IN A JAR OF CERAVE CREAM. APPLY TO AFFECTED AREAS OF ECZEMA ONE TO TWO TIMES DAILY UNTIL IMPROVED. AVOID FACE, GROIN, AND AXILLA, Disp: 50 mL, Rfl: 1   doxycycline (MONODOX) 100 MG capsule, TAKE 1 CAPSULE BY MOUTH ONCE DAILY WITH FOOD, Disp: 30 capsule, Rfl: 3   dupilumab (DUPIXENT) 300 MG/2ML prefilled syringe, Inject 300 mg into the skin every 14 (fourteen) days. For  maintenance., Disp: 4 mL, Rfl: 5   ipratropium (ATROVENT) 0.03 % nasal spray, Place 2 sprays into both nostrils every 12 (twelve) hours. (Patient not taking: Reported on 11/25/2022), Disp: 30 mL, Rfl: 12   meloxicam (MOBIC) 15 MG tablet, Take 15 mg by mouth daily., Disp: , Rfl:    montelukast (SINGULAIR) 10 MG tablet, Take 1 tablet (10 mg total) by mouth at bedtime. (Patient not taking: Reported on 06/22/2022), Disp: 30 tablet, Rfl: 3   omeprazole (PRILOSEC) 20 MG capsule, Take 2 capsules (40 mg total) by mouth daily., Disp: 60 capsule, Rfl: 11   sildenafil (REVATIO) 20 MG tablet, TAKE 2 TO 5 TABLETS by mouth once daily as needed to obtain/maintain erection., Disp: 20 tablet, Rfl: 11   venlafaxine XR (EFFEXOR-XR) 150 MG 24 hr capsule, Take 2 capsules (300 mg total) by mouth daily with breakfast. TAKE 2 CAPSULES BY MOUTH ONCE DAILY WITH BREAKFAST, Disp: 180 capsule, Rfl: 0  Social History   Tobacco Use  Smoking Status Former   Current packs/day: 0.00   Types: Cigarettes   Quit date: 06/18/1968   Years since quitting: 54.9  Smokeless Tobacco Never    Allergies  Allergen Reactions   Sulfa Antibiotics    Elemental Sulfur Rash   Objective:  There were no vitals filed for this visit. There is no height or weight on file to calculate BMI. Constitutional Well  developed. Well nourished.  Vascular Dorsalis pedis pulses palpable bilaterally. Posterior tibial pulses palpable bilaterally. Capillary refill normal to all digits.  No cyanosis or clubbing noted. Pedal hair growth normal.  Neurologic Normal speech. Oriented to person, place, and time. Epicritic sensation to light touch grossly present bilaterally.  Dermatologic Nails well groomed and normal in appearance. No open wounds. No skin lesions.  Orthopedic: Left plantarflexed fifth metatarsal noted pain on palpation submetatarsal 5.  Central nucleated core with hyperkeratotic lesion noted   Radiographs: 3 views of skeletally mature  adult left foot: Plantarflexed fifth metatarsal noted mild tailor's bunion deformity noted.  Negative negative metatarsal height noted Assessment:   1. Plantar flexed metatarsal, left     Plan:  Patient was evaluated and treated and all questions answered.  Left plantarflexed fifth metatarsal -All questions and concerns were discussed with the patient in extensive detail -Given the amount of pain that is having a benefit from surgical floating osteotomy of the fifth metatarsal to take the pressure off the submetatarsal 5.  I discussed my procedure in extensive detail I discussed my preoperative intra postop plan with the patient extensive detail he states understanding like to proceed with surgery -Informed surgical risk consent was reviewed and read aloud to the patient.  I reviewed the films.  I have discussed my findings with the patient in great detail.  I have discussed all risks including but not limited to infection, stiffness, scarring, limp, disability, deformity, damage to blood vessels and nerves, numbness, poor healing, need for braces, arthritis, chronic pain, amputation, death.  All benefits and realistic expectations discussed in great detail.  I have made no promises as to the outcome.  I have provided realistic expectations.  I have offered the patient a 2nd opinion, which they have declined and assured me they preferred to proceed despite the risks -Lesion was debrided down to give him some relief.   No follow-ups on file.

## 2023-05-21 ENCOUNTER — Telehealth: Payer: Self-pay | Admitting: Family Medicine

## 2023-05-21 MED ORDER — OMEPRAZOLE 20 MG PO CPDR: 40.0000 mg | DELAYED_RELEASE_CAPSULE | Freq: Every day | ORAL | 11 refills | Status: AC

## 2023-05-21 NOTE — Telephone Encounter (Signed)
 [facility] pharmacy is requesting refill omeprazole (PRILOSEC) 20 MG capsule  Please advise

## 2023-06-04 ENCOUNTER — Other Ambulatory Visit: Payer: Self-pay | Admitting: Family Medicine

## 2023-06-04 DIAGNOSIS — R0602 Shortness of breath: Secondary | ICD-10-CM

## 2023-06-04 NOTE — Telephone Encounter (Signed)
 Requested medication (s) are due for refill today: yes  Requested medication (s) are on the active medication list: yes  Last refill:  02/23/23 #18g/0  Future visit scheduled: no  Notes to clinic:  LOV with Debbi Failing, NP, please review for refill     Requested Prescriptions  Pending Prescriptions Disp Refills   albuterol  (VENTOLIN  HFA) 108 (90 Base) MCG/ACT inhaler [Pharmacy Med Name: Albuterol  Sulfate HFA 108 (90 Base) MCG/ACT Inhalation Aerosol Solution] 18 g 0    Sig: INHALE 2 PUFFS BY MOUTH EVERY 4 HOURS AS NEEDED FOR WHEEZING AND FOR SHORTNESS OF BREATH     Pulmonology:  Beta Agonists 2 Failed - 06/04/2023  3:54 PM      Failed - Valid encounter within last 12 months    Recent Outpatient Visits   None     Future Appointments             In 5 months Artemio Larry, MD [facility]            Passed - Last BP in normal range    BP Readings from Last 1 Encounters:  11/25/22 130/82         Passed - Last Heart Rate in normal range    Pulse Readings from Last 1 Encounters:  11/25/22 84

## 2023-06-08 ENCOUNTER — Other Ambulatory Visit: Payer: Self-pay | Admitting: Dermatology

## 2023-06-09 ENCOUNTER — Other Ambulatory Visit: Payer: Self-pay | Admitting: Family Medicine

## 2023-06-09 DIAGNOSIS — F419 Anxiety disorder, unspecified: Secondary | ICD-10-CM

## 2023-06-10 ENCOUNTER — Ambulatory Visit: Payer: Self-pay

## 2023-06-10 NOTE — Telephone Encounter (Signed)
 LVMTCB ok for E2C2 to advise patient that PCP is out of office but patient can schedule UC follow-up as advised after being seen with any PCP in office.

## 2023-06-10 NOTE — Telephone Encounter (Signed)
 Chief Complaint: fatigue Symptoms: episodes of fatigue, mild SOB, nausea and vomiting when outside in the heat and exerting himself Frequency: intermittent Pertinent Negatives: Patient denies chest pain, abdominal pain Disposition: [] ED /[x] Urgent Care (no appt availability in office) / [] Appointment(In office/virtual)/ []  [facility]/ [] Home Care/ [] Refused Recommended Disposition /[] [facility]/ []  Follow-up with PCP Additional Notes: Patient states he feels the symptoms thought out the year to some degree and this is his 3rd summer with symptoms. It is worsened by being out in the heat. Patient states in the last 24 hours he has not had any episodes of vomiting. He states it has been a week since vomiting. He states being in the heat or outside golfing makes him fatigued and vomit. He states he does not feel SOB right now sitting. Patient speaking in full sentences, no wheezing or labored breathing noted. No available appts with PCP or providers at [facility] until May 12th, advised patient go to urgent care. Patient agreeable and is requesting follow up appointment with clinic after. Patient requesting call back from clinic.  Copied from CRM [id number]. Topic: Clinical - Red Word Triage >> Jun 10, 2023  2:58 PM [name] wrote: Red Word that prompted transfer to Nurse Triage: The patient called in stating he has had bad nausea, vomiting, low energy and shortness of breath lately. He says he has had this on and off for a few years and it has gotten worse. I will transfer him to [facility] Reason for Disposition  [1] MILD weakness (i.e., does not interfere with ability to work, go to school, normal activities) AND [2] persists > 1 week  Answer Assessment - Initial Assessment Questions 1. DESCRIPTION: "Describe how you are feeling."     Patient states today he felt normal. He states normal is as long as he is inside. He states he went for a walk 20 minutes ago and he has trouble going up  hills and has to catch his breath.  2. SEVERITY: "How bad is it?"  "Can you stand and walk?"   - MILD (0-3): Feels weak or tired, but does not interfere with work, school or normal activities.   - MODERATE (4-7): Able to stand and walk; weakness interferes with work, school, or normal activities.   - SEVERE (8-10): Unable to stand or walk; unable to do usual activities.     Mild.  3. ONSET: "When did these symptoms begin?" (e.g., hours, days, weeks, months)     He states this is his 3rd summer having the symptoms; worsened over the last few weeks. He states he was outside working in the yard and threw up when he got into the shower.  4. CAUSE: "What do you think is causing the weakness or fatigue?" (e.g., not drinking enough fluids, medical problem, trouble sleeping)     He states it may have something to do with not drinking enough fluids and the heat outside. He states over the last year he is doing better about drinking water.  5. NEW MEDICINES:  "Have you started on any new medicines recently?" (e.g., opioid pain medicines, benzodiazepines, muscle relaxants, antidepressants, antihistamines, neuroleptics, beta blockers)     Denies.  6. OTHER SYMPTOMS: "Do you have any other symptoms?" (e.g., chest pain, fever, cough, SOB, vomiting, diarrhea, bleeding, other areas of pain)     SOB when exerting himself or out in the heat, vomiting intermittent,   7. PREGNANCY: "Is there any chance you are pregnant?" "When was  your last menstrual period?"     N/A.  Protocols used: Weakness (Generalized) and Fatigue-A-AH

## 2023-06-16 ENCOUNTER — Encounter: Payer: Self-pay | Admitting: Family Medicine

## 2023-06-16 ENCOUNTER — Ambulatory Visit (INDEPENDENT_AMBULATORY_CARE_PROVIDER_SITE_OTHER): Admitting: Family Medicine

## 2023-06-16 VITALS — BP 121/76 | HR 87 | Resp 16 | Ht 71.0 in | Wt 193.6 lb

## 2023-06-16 DIAGNOSIS — R0609 Other forms of dyspnea: Secondary | ICD-10-CM | POA: Diagnosis not present

## 2023-06-16 DIAGNOSIS — K219 Gastro-esophageal reflux disease without esophagitis: Secondary | ICD-10-CM

## 2023-06-16 DIAGNOSIS — J301 Allergic rhinitis due to pollen: Secondary | ICD-10-CM

## 2023-06-16 DIAGNOSIS — J441 Chronic obstructive pulmonary disease with (acute) exacerbation: Secondary | ICD-10-CM

## 2023-06-16 DIAGNOSIS — R600 Localized edema: Secondary | ICD-10-CM | POA: Diagnosis not present

## 2023-06-16 DIAGNOSIS — N1831 Chronic kidney disease, stage 3a: Secondary | ICD-10-CM

## 2023-06-16 DIAGNOSIS — R531 Weakness: Secondary | ICD-10-CM

## 2023-06-16 MED ORDER — MONTELUKAST SODIUM 10 MG PO TABS
10.0000 mg | ORAL_TABLET | Freq: Every day | ORAL | 1 refills | Status: DC
Start: 2023-06-16 — End: 2023-09-16

## 2023-06-16 MED ORDER — FLUTICASONE-SALMETEROL 100-50 MCG/ACT IN AEPB: 1.0000 | INHALATION_SPRAY | Freq: Two times a day (BID) | RESPIRATORY_TRACT | 3 refills | Status: DC

## 2023-06-16 NOTE — Progress Notes (Signed)
 Established patient visit   Patient: Edward Rodriguez   DOB: 01/08/1941   83 y.o. Male  MRN: [id number] Visit Date: 06/16/2023  Today's healthcare provider: Carlean Charter, DO   Chief Complaint  Patient presents with   Fatigue    Fatigue and weakness .Edward AasHas been having these symptoms for yrs. SOB, Nausea has increased over the years. Wants a blood panel and maybe a referral to Endo   Subjective    HPI  Edward Rodriguez is an 83 year old male with chronic bronchitis who presents with weakness and vomiting exacerbated by heat exposure.  He experiences weakness and vomiting, particularly when exposed to heat during activities like yard work or [employer]. These symptoms have persisted for the past three years, typically occurring in hotter months. He needs to rest after five to seven minutes of activity and can resume after a short break, but he tires quickly.  He has a history of chronic bronchitis and uses an albuterol  inhaler at least once daily, taking two puffs. He also uses it before going outside or taking a walk. He can only walk about half the distance he used to, which he attributes to weight gain. He has not used any other inhalers besides albuterol .  He recalls having an endoscopy over five years ago for esophageal issues, possibly related to gastroesophageal reflux disease (GERD), for which he takes Prilosec and omeprazole . No chest pain, heart palpitations, or abdominal pain outside of vomiting episodes. He experiences nasal drainage on the right side when outside.  He reports swelling in his feet, for which he wears compression socks, though he finds them uncomfortable. He experienced a painful episode last year, attributed to a small clot.   He has a history of dehydration and recalls a near heat stroke episode several years ago while playing golf. He is currently drinking about four glasses of water daily.      Medications: Outpatient Medications Prior to Visit  Medication  Sig   albuterol  (VENTOLIN  HFA) 108 (90 Base) MCG/ACT inhaler INHALE 2 PUFFS BY MOUTH EVERY 4 HOURS AS NEEDED FOR WHEEZING AND FOR SHORTNESS OF BREATH   apixaban  (ELIQUIS ) 5 MG TABS tablet Take 1 tablet (5 mg total) by mouth 2 (two) times daily.   clobetasol  (TEMOVATE ) 0.05 % external solution MIX CLOBETASOL  SOLUTION IN A JAR OF CERAVE CREAM. APPLY TO AFFECTED AREAS OF ECZEMA ONE TO TWO TIMES DAILY UNTIL IMPROVED. AVOID FACE, GROIN, AND AXILLA   doxycycline  (MONODOX ) 100 MG capsule TAKE 1 CAPSULE BY MOUTH ONCE DAILY WITH FOOD   dupilumab  (DUPIXENT ) 300 MG/2ML prefilled syringe Inject 300 mg into the skin every 14 (fourteen) days. For maintenance.   ipratropium (ATROVENT ) 0.03 % nasal spray Place 2 sprays into both nostrils every 12 (twelve) hours.   meloxicam  (MOBIC ) 15 MG tablet Take 15 mg by mouth daily.   omeprazole  (PRILOSEC) 20 MG capsule Take 2 capsules (40 mg total) by mouth daily.   sildenafil  (REVATIO ) 20 MG tablet TAKE 2 TO 5 TABLETS by mouth once daily as needed to obtain/maintain erection.   venlafaxine  XR (EFFEXOR -XR) 150 MG 24 hr capsule TAKE 2 CAPSULES BY MOUTH ONCE DAILY WITH BREAKFAST   [DISCONTINUED] montelukast  (SINGULAIR ) 10 MG tablet Take 1 tablet (10 mg total) by mouth at bedtime.   No facility-administered medications prior to visit.    Review of Systems  Constitutional:  Negative for appetite change, chills and fever.  HENT:  Positive for rhinorrhea.   Respiratory:  Positive  for shortness of breath (On exertion). Negative for chest tightness and wheezing.   Cardiovascular:  Negative for chest pain and palpitations.  Gastrointestinal:  Positive for nausea and vomiting. Negative for abdominal pain.  Neurological:  Positive for weakness.        Objective    BP 121/76 (BP Location: Right Arm, Patient Position: Sitting, Cuff Size: Normal)   Pulse 87   Resp 16   Ht 5\' 11"  (1.803 m)   Wt 193 lb 9.6 oz (87.8 kg)   SpO2 99%   BMI 27.00 kg/m     Physical  Exam Constitutional:      Appearance: Normal appearance.  HENT:     Head: Normocephalic and atraumatic.  Eyes:     General: No scleral icterus.    Extraocular Movements: Extraocular movements intact.     Conjunctiva/sclera: Conjunctivae normal.  Cardiovascular:     Rate and Rhythm: Normal rate and regular rhythm.     Pulses: Normal pulses.     Heart sounds: Normal heart sounds.  Pulmonary:     Effort: Pulmonary effort is normal. No respiratory distress.     Breath sounds: Normal breath sounds. Decreased air movement present.  Abdominal:     General: Bowel sounds are normal. There is no distension.     Palpations: Abdomen is soft. There is no mass.     Tenderness: There is no abdominal tenderness. There is no guarding.  Musculoskeletal:     Right lower leg: No edema.     Left lower leg: No edema.  Skin:    General: Skin is warm and dry.  Neurological:     Mental Status: He is alert and oriented to person, place, and time. Mental status is at baseline.  Psychiatric:        Mood and Affect: Mood normal.        Behavior: Behavior normal.      No results found for any visits on 06/16/23.  Assessment & Plan    Weakness generalized -     Comprehensive metabolic panel with GFR -     CBC with Differential/Platelet  COPD with acute exacerbation (HCC) -     Fluticasone -Salmeterol; Inhale 1 puff into the lungs 2 (two) times daily.  Dispense: 1 each; Refill: 3 -     Montelukast  Sodium; Take 1 tablet (10 mg total) by mouth at bedtime.  Dispense: 90 tablet; Refill: 1 -     Pulmonary Visit  DOE (dyspnea on exertion) -     Pulmonary Visit  Seasonal allergic rhinitis due to pollen  Peripheral edema  Gastroesophageal reflux disease, unspecified whether esophagitis present     Weakness generalized; chronic Obstructive Pulmonary Disease (COPD); dyspnea on exertion COPD with persistent dyspnea and fatigue, exacerbated by heat and exertion. Albuterol  used daily; long-acting inhaler  recommended. Pulmonology referral for further evaluation, with possible pulmonary function testing and assessment of current oxygen requirements. - Refer to pulmonology for evaluation and pulmonary function testing, including a walk study. - Prescribe Advair as a daily inhaler. - Continue albuterol  as needed for acute dyspnea. - Order blood work to assess for alternative metabolic sources of patient's dyspnea and weakness.  Allergic Rhinitis Nasal drainage outdoors, no current allergy medication use.  Allergy symptoms may be contributing to dyspnea while outdoors, with possible allergic asthma. - Prescribe Singulair .  Peripheral Edema Reported peripheral edema in feet, managed with compression socks. Reports cold feet at night with no significant improvement.  No edema observed today. - Continue use  of compression socks.  Gastroesophageal Reflux Disease (GERD) GERD managed with omeprazole . Vomiting episodes may relate to dyspnea rather than GERD. - Continue omeprazole .  Chronic kidney disease stage G3a/A2 Rechecking BMP today.  No acute concerns.  Patient follows with nephrology.  Defer to specialist management.   Return in about 3 months (around 09/16/2023) for Chronic f/u w/PCP.      I discussed the assessment and treatment plan with the patient  The patient was provided an opportunity to ask questions and all were answered. The patient agreed with the plan and demonstrated an understanding of the instructions.   The patient was advised to call back or seek an in-person evaluation if the symptoms worsen or if the condition fails to improve as anticipated.    Carlean Charter, DO  [facility] [phone] (phone) [phone] (fax)  [facility]

## 2023-06-17 ENCOUNTER — Encounter: Payer: Self-pay | Admitting: Family Medicine

## 2023-06-17 ENCOUNTER — Other Ambulatory Visit: Payer: Self-pay | Admitting: Dermatology

## 2023-06-17 LAB — COMPREHENSIVE METABOLIC PANEL WITH GFR
ALT: 15 IU/L (ref 0–44)
AST: 17 IU/L (ref 0–40)
Albumin: 4.2 g/dL (ref 3.7–4.7)
Alkaline Phosphatase: 95 IU/L (ref 44–121)
BUN/Creatinine Ratio: 15 (ref 10–24)
BUN: 20 mg/dL (ref 8–27)
Bilirubin Total: 0.3 mg/dL (ref 0.0–1.2)
CO2: 20 mmol/L (ref 20–29)
Calcium: 9.5 mg/dL (ref 8.6–10.2)
Chloride: 106 mmol/L (ref 96–106)
Creatinine, Ser: 1.32 mg/dL — ABNORMAL HIGH (ref 0.76–1.27)
Globulin, Total: 2.3 g/dL (ref 1.5–4.5)
Glucose: 100 mg/dL — ABNORMAL HIGH (ref 70–99)
Potassium: 4.7 mmol/L (ref 3.5–5.2)
Sodium: 142 mmol/L (ref 134–144)
Total Protein: 6.5 g/dL (ref 6.0–8.5)
eGFR: 54 mL/min/{1.73_m2} — ABNORMAL LOW (ref >59)

## 2023-06-17 LAB — CBC WITH DIFFERENTIAL/PLATELET
Basophils Absolute: 0.1 10*3/uL (ref 0.0–0.2)
Basos: 1 %
EOS (ABSOLUTE): 0.3 10*3/uL (ref 0.0–0.4)
Eos: 5 %
Hematocrit: 43.2 % (ref 37.5–51.0)
Hemoglobin: 14.5 g/dL (ref 13.0–17.7)
Immature Grans (Abs): 0 10*3/uL (ref 0.0–0.1)
Immature Granulocytes: 0 %
Lymphocytes Absolute: 1.7 10*3/uL (ref 0.7–3.1)
Lymphs: 27 %
MCH: 30.9 pg (ref 26.6–33.0)
MCHC: 33.6 g/dL (ref 31.5–35.7)
MCV: 92 fL (ref 79–97)
Monocytes Absolute: 0.6 10*3/uL (ref 0.1–0.9)
Monocytes: 10 %
Neutrophils Absolute: 3.7 10*3/uL (ref 1.4–7.0)
Neutrophils: 57 %
Platelets: 222 10*3/uL (ref 150–450)
RBC: 4.69 x10E6/uL (ref 4.14–5.80)
RDW: 13.1 % (ref 11.6–15.4)
WBC: 6.4 10*3/uL (ref 3.4–10.8)

## 2023-06-22 DIAGNOSIS — N281 Cyst of kidney, acquired: Secondary | ICD-10-CM | POA: Diagnosis not present

## 2023-06-22 DIAGNOSIS — F419 Anxiety disorder, unspecified: Secondary | ICD-10-CM | POA: Diagnosis not present

## 2023-06-22 DIAGNOSIS — N1831 Chronic kidney disease, stage 3a: Secondary | ICD-10-CM | POA: Diagnosis not present

## 2023-06-22 DIAGNOSIS — R809 Proteinuria, unspecified: Secondary | ICD-10-CM | POA: Diagnosis not present

## 2023-06-22 DIAGNOSIS — K219 Gastro-esophageal reflux disease without esophagitis: Secondary | ICD-10-CM | POA: Diagnosis not present

## 2023-06-22 DIAGNOSIS — F32A Depression, unspecified: Secondary | ICD-10-CM | POA: Diagnosis not present

## 2023-06-28 ENCOUNTER — Ambulatory Visit: Payer: Self-pay | Admitting: Family Medicine

## 2023-06-29 ENCOUNTER — Encounter (INDEPENDENT_AMBULATORY_CARE_PROVIDER_SITE_OTHER): Payer: Self-pay

## 2023-06-29 DIAGNOSIS — R809 Proteinuria, unspecified: Secondary | ICD-10-CM | POA: Diagnosis not present

## 2023-06-29 DIAGNOSIS — N1831 Chronic kidney disease, stage 3a: Secondary | ICD-10-CM | POA: Diagnosis not present

## 2023-06-29 DIAGNOSIS — K219 Gastro-esophageal reflux disease without esophagitis: Secondary | ICD-10-CM | POA: Diagnosis not present

## 2023-06-29 DIAGNOSIS — F419 Anxiety disorder, unspecified: Secondary | ICD-10-CM | POA: Diagnosis not present

## 2023-06-29 DIAGNOSIS — F32A Depression, unspecified: Secondary | ICD-10-CM | POA: Diagnosis not present

## 2023-06-29 DIAGNOSIS — N281 Cyst of kidney, acquired: Secondary | ICD-10-CM | POA: Diagnosis not present

## 2023-07-08 ENCOUNTER — Telehealth: Payer: Self-pay | Admitting: Podiatry

## 2023-07-08 NOTE — Telephone Encounter (Signed)
 Pt left message stating he was wanting to know his responsibility for his surgery.   Cpt [id number] lt foot.   I told pt I would forward the documents to our billing dept and to the surgery center and they should provide him with a quote.

## 2023-07-16 ENCOUNTER — Ambulatory Visit: Admitting: Family Medicine

## 2023-07-20 ENCOUNTER — Other Ambulatory Visit: Payer: Self-pay | Admitting: Dermatology

## 2023-07-20 DIAGNOSIS — L2081 Atopic neurodermatitis: Secondary | ICD-10-CM

## 2023-07-22 ENCOUNTER — Ambulatory Visit: Admitting: Student in an Organized Health Care Education/Training Program

## 2023-07-29 ENCOUNTER — Telehealth: Payer: Self-pay

## 2023-07-29 NOTE — Telephone Encounter (Signed)
 Copied from CRM [id number]. Topic: Clinical - Medication Question >> Jul 28, 2023  1:42 PM Edward Rodriguez wrote: Reason for CRM: patient has a concern about losing his voice and he thinks its because of the inhaler he has been prescribed, doesn't know if he should stop it completely or lower the dosage / frequency he uses it . Ask if someone can call back and if no answer leave a short message

## 2023-07-29 NOTE — Telephone Encounter (Signed)
 I have not personally meet Edward Rodriguez yet. I would recommend he schedule a sooner appt to be assessed.

## 2023-07-30 NOTE — Telephone Encounter (Signed)
 FYI-Patient scheduled to see you 08/12/23 there are no sooner appointments available.

## 2023-08-03 NOTE — Telephone Encounter (Signed)
 Noted - pt symptoms worsen prior to 08/12/23 appointment ( difficulty with speech, breathing or swallowing)  recommend urgent care or ED.

## 2023-08-04 NOTE — Telephone Encounter (Signed)
 If symptoms worsening/ started since start of inhaler would recommend holding. I am concerned through he will not have coverage for daily maintenance. Would he like to trial a different one in the meantime. Again if worsening - needs to seek higher level care.

## 2023-08-04 NOTE — Telephone Encounter (Signed)
 Pt reports he is actually feeling better so disregard and he will see us  on 07/03

## 2023-08-12 ENCOUNTER — Encounter: Payer: Self-pay | Admitting: Family Medicine

## 2023-08-12 ENCOUNTER — Ambulatory Visit: Admitting: Family Medicine

## 2023-08-12 VITALS — BP 123/83 | HR 82 | Temp 97.0°F | Ht 71.0 in | Wt 191.9 lb

## 2023-08-12 DIAGNOSIS — Z23 Encounter for immunization: Secondary | ICD-10-CM | POA: Diagnosis not present

## 2023-08-12 DIAGNOSIS — R110 Nausea: Secondary | ICD-10-CM | POA: Diagnosis not present

## 2023-08-12 DIAGNOSIS — J42 Unspecified chronic bronchitis: Secondary | ICD-10-CM

## 2023-08-12 DIAGNOSIS — R413 Other amnesia: Secondary | ICD-10-CM

## 2023-08-12 DIAGNOSIS — K219 Gastro-esophageal reflux disease without esophagitis: Secondary | ICD-10-CM

## 2023-08-12 NOTE — Progress Notes (Signed)
 Established Patient Office Visit  Introduced to nurse practitioner role and practice setting.  All questions answered.  Discussed provider/patient relationship and expectations.  Subjective   Patient ID: Edward Rodriguez, male    DOB: [birth year]  Age: 83 y.o. MRN: [id number]  Chief Complaint  Patient presents with   Nausea    Patient reports that this has been ongoing for a few summers for nausea when he would be outside in heat he would get sick. Wanted to meet provider to discuss with provider the problems that he has been having but states that they have got better.  Also states that he has stopped drinking soda and drinking more water.   Discussed the use of AI scribe software for clinical note transcription with the patient, who gave verbal consent to proceed.   History of Present Illness Edward Rodriguez is an 83 year old male who presents with nausea associated with heat exposure during golfing, memory concerns, and general check up with new provider.  He experiences nausea  during the summer months when exposed to heat while golfing. These symptoms have been present for the last three summers and are triggered by heat exposure, leading to episodes of vomiting. He has been using a new inhaler and taking Singulair  (montelukast ) at night, which has improved his symptoms. During his last round of golf, he completed 18 holes without experiencing nausea or vomiting.  He also used to experience low-grade nausea in the mornings, which he attributes to gastric reflux. He takes omeprazole  for the GERD the symptoms - which has helped. He has eliminated caffeine from his diet, which he believes was a trigger, especially in the mornings. He has also increased his water intake, although he has not yet reached the recommended amount.  He has a history of dehydration, which he suspects may have contributed to his nausea symptoms. He has never been a regular water drinker, especially in the winter, but has been  trying to improve his fluid intake. He drinks about four glasses of water a day but acknowledges that he needs to drink more, and unsure of total amount.  He uses an Advair inhaler, which initially caused his voice to become raspy, but this has since improved. He rinses his mouth after using the inhaler to prevent dryness. He has a history of bronchitis and experiences a persistent cough, which he has had for years. He has a pulm appointment on 09/15/23.  No dizziness, abdominal pain, changes in bowel movements, or passing out. He feels weak when nausea occurs. He denies having diabetes and has no family history of it. He has never been tested for diabetes, but his glucose levels have been normal in the past year.  He has a history of a blood clot for which he takes a baby aspirin daily. He has experienced difficulty with word finding and sometimes confuses numbers or words, which his wife has noticed. He has not had any safety issues related to memory, such as forgetting where he is driving or leaving the stove on. He has an active mind, which sometimes distracts him from focusing on tasks.      08/12/2023    9:51 AM 06/16/2023    8:42 AM 08/12/2022    9:50 AM  Depression screen PHQ 2/9  Decreased Interest 0 0 0  Down, Depressed, Hopeless 0 1 0  PHQ - 2 Score 0 1 0  Altered sleeping 2 1   Tired, decreased energy 1 3   Change in appetite  0 1   Feeling bad or failure about yourself  1 1   Trouble concentrating 1 2   Moving slowly or fidgety/restless 0 0   Suicidal thoughts 0 0   PHQ-9 Score 5 9   Difficult doing work/chores Not difficult at all Somewhat difficult        08/12/2023    9:51 AM 06/16/2023    8:42 AM 06/09/2019    5:29 PM  GAD 7 : Generalized Anxiety Score  Nervous, Anxious, on Edge 1 2 0  Control/stop worrying 1 2 1   Worry too much - different things 1 1 0  Trouble relaxing 1 1 2   Restless 0 0 3  Easily annoyed or irritable 0 1 0  Afraid - awful might happen 0 0 0  Total GAD  7 Score 4 7 6   Anxiety Difficulty Not difficult at all Somewhat difficult Somewhat difficult      08/12/2023   10:12 AM 08/12/2022    9:59 AM  6CIT Screen  What Year? 0 points 0 points  What month? 0 points 0 points  What time? 0 points 0 points  Count back from 20 0 points 0 points  Months in reverse 0 points 0 points  Repeat phrase 0 points 0 points  Total Score 0 points 0 points      Review of Systems  All other systems reviewed and are negative.   Negative unless indicated in HPI   Objective:     BP 123/83 (BP Location: Left Arm, Patient Position: Sitting)   Pulse 82   Temp (!) 97 F (36.1 C) (Oral)   Ht 5' 11 (1.803 m)   Wt 191 lb 14.4 oz (87 kg)   SpO2 98%   BMI 26.76 kg/m    Physical Exam Constitutional:      General: He is awake. He is not in acute distress.    Appearance: Normal appearance. He is well-developed and well-groomed. He is not ill-appearing, toxic-appearing or diaphoretic.  HENT:     Head: Normocephalic.     Nose: Nose normal.     Mouth/Throat:     Mouth: Mucous membranes are moist.  Eyes:     General: Lids are normal. Vision grossly intact. Gaze aligned appropriately.     Extraocular Movements: Extraocular movements intact.     Right eye: Normal extraocular motion and no nystagmus.     Left eye: Normal extraocular motion and no nystagmus.     Conjunctiva/sclera: Conjunctivae normal.     Pupils: Pupils are equal, round, and reactive to light.     Comments: Prescription lens on   Cardiovascular:     Rate and Rhythm: Normal rate and regular rhythm.     Heart sounds: No murmur heard.    No friction rub. No gallop.  Pulmonary:     Effort: Pulmonary effort is normal. No respiratory distress.     Breath sounds: Normal breath sounds. No stridor. No wheezing, rhonchi or rales.     Comments: Clear breath sounds, but coughs with every inhalation Chest:     Chest wall: No tenderness.  Musculoskeletal:     Cervical back: Normal range of motion  and neck supple. No edema, erythema or rigidity. No pain with movement, spinous process tenderness or muscular tenderness. Normal range of motion.     Right lower leg: No edema.     Left lower leg: No edema.  Lymphadenopathy:     Upper Body:     Right upper body: No  supraclavicular or axillary adenopathy.     Left upper body: No supraclavicular or axillary adenopathy.  Skin:    General: Skin is warm and dry.     Capillary Refill: Capillary refill takes less than 2 seconds.     Coloration: Skin is ashen.     Comments: Sun exposure apparent, dry and ashen,subtle ruddy in coloring mid ankle  Neurological:     General: No focal deficit present.     Mental Status: He is alert and oriented to person, place, and time. Mental status is at baseline.     GCS: GCS eye subscore is 4. GCS verbal subscore is 5. GCS motor subscore is 6.     Cranial Nerves: Cranial nerves 2-12 are intact. No cranial nerve deficit, dysarthria or facial asymmetry.     Sensory: Sensation is intact. No sensory deficit.     Motor: No weakness or tremor.     Coordination: Coordination is intact. Coordination normal.     Gait: Gait normal.  Psychiatric:        Attention and Perception: He is inattentive.        Mood and Affect: Mood and affect normal.        Speech: Speech is tangential.        Behavior: Behavior normal. Behavior is cooperative.        Thought Content: Thought content normal.        Cognition and Memory: Cognition normal.        Judgment: Judgment normal.     Comments: Recent memory mildly impaired with dates of appointments, otherwise intact      No results found for any visits on 08/12/23.    The ASCVD Risk score (Arnett DK, et al., 2019) failed to calculate for the following reasons:   The 2019 ASCVD risk score is only valid for ages 34 to 9    Assessment & Plan:  Need for shingles vaccine -     Varicella-zoster vaccine IM  Memory difficulties  Gastroesophageal reflux disease,  unspecified whether esophagitis present  Nausea     Assessment and Plan Assessment & Plan Intermittent Nausea w/ rare vomiting - Intermittent symptoms improved with Singulair  and hydration. Has not had feeling in a while, and has not vomited in over a year.   - Dehydration and low blood sugar potential factors. No history of DM. Pt has coughing fits - Continue Singulair  as prescribed. - Continue Omeprazole  for GERD - Increase water intake to minimally 70 ounces daily. - Consider adding lemon or fruit to water for taste - Maintain regular snacks during physical activity. - Given improving will hold on blood tests today, future consider A1C  COPD- Chronic Bronchitis Hx of former smoker Persistent cough managed with Advair. Recent improvement noted. Dry mouth as a side effect. Pulmonary referral advised. - Not in resp distress today, Spo2 = 98%, no sob or dob, does have persistent cough with deep inhalation. - Continue Advair inhaler. - Rinse mouth after inhaler use. - Use OTC, Biotene, for dry mouth as needed. - Keep pulmonary referral with Dr. Tamea on August 6th.  Memory Concerns Memory lapses reported, no significant issues on screening. No signs of dementia - Does appear to have word finding at times and reading out loud concerns, which maybe related to vision, as pt states his glasses are not working as well and has appt with optho soon..  - Denies safety concerns of forgetting where to drive, leaving door unlocked or stove on.  -  Wife concern he does not remember what she tells him, may be more related to pt's inattention to conversation. Hearing appears grossly intact. - Recommend focusing on conversation when having with wife - Write down to [place] - Will revisit at next visit  - Further evaluation considered if symptoms persist. - Consider further evaluation if symptoms persist or worsen.  General Health Maintenance Discussed shingles vaccination to prevent  complications. - Administer shingles vaccine.  Follow-up Follow-up plans discussed for monitoring symptoms and health. Pulmonary appointment advised. - Schedule follow-up appointment in 3 months. - Schedule annual Medicare wellness visit.   Return in about 3 months (around 11/12/2023).   I, Curtis DELENA Boom, FNP, have reviewed all documentation for this visit. The documentation on 08/12/23 for the exam, diagnosis, procedures, and orders are all accurate and complete.   Curtis DELENA Boom, FNP

## 2023-09-06 ENCOUNTER — Other Ambulatory Visit: Payer: Self-pay | Admitting: Family Medicine

## 2023-09-06 DIAGNOSIS — F419 Anxiety disorder, unspecified: Secondary | ICD-10-CM

## 2023-09-15 ENCOUNTER — Encounter: Payer: Self-pay | Admitting: Pulmonary Disease

## 2023-09-15 ENCOUNTER — Ambulatory Visit: Admitting: Pulmonary Disease

## 2023-09-15 VITALS — BP 116/60 | HR 92 | Temp 97.6°F | Ht 71.0 in | Wt 191.2 lb

## 2023-09-15 DIAGNOSIS — J45909 Unspecified asthma, uncomplicated: Secondary | ICD-10-CM | POA: Diagnosis not present

## 2023-09-15 DIAGNOSIS — R0602 Shortness of breath: Secondary | ICD-10-CM

## 2023-09-15 LAB — NITRIC OXIDE: Nitric Oxide: 14

## 2023-09-15 MED ORDER — ALBUTEROL SULFATE HFA 108 (90 BASE) MCG/ACT IN AERS: 2.0000 | INHALATION_SPRAY | Freq: Four times a day (QID) | RESPIRATORY_TRACT | 2 refills | Status: AC | PRN

## 2023-09-15 NOTE — Progress Notes (Signed)
 Subjective:    Patient ID: Edward Rodriguez, male    DOB: [birth year], 83 y.o.   MRN: [id number]  Patient Care Team: Wellington Curtis LABOR, FNP as PCP - General (Family Medicine) Darliss Rogue, MD as PCP - Cardiology (Cardiology) [facility], [facility])  Chief Complaint  Patient presents with   Consult    Shortness of breath on exertion. Occasional cough and wheezing.     BACKGROUND: Patient presents for evaluation of shortness of breath on exertion he is kindly referred by Dr. Lauraine Buoy.  HPI Discussed the use of AI scribe software for clinical note transcription with the patient, who gave verbal consent to proceed.  History of Present Illness   Edward Rodriguez is an 83 year old male who presents with shortness of breath and weakness.  He experiences shortness of breath and weakness primarily during the summer months for the past three years. These symptoms occur during activities such as playing golf and working in the yard, where he can only manage five to ten minutes of activity before needing to rest. Symptoms recur after five to seven minutes of rest. Additionally, he experiences nausea and vomiting if he becomes too hot, which has led to him stopping golf due to embarrassment.  He recalls having had COVID-19 2 years ago and his symptoms becoming worse after that.  He has been using an Advair inhaler and taking Singulair  (montelukast ) at night, which he feels have provided some relief in managing his symptoms.  He does not have a rescue inhaler.  He has been on Advair for approximately 2-1/2 months.  He mentions a history of dehydration, which he believes may have contributed to his symptoms. He recalls an incident years ago where he nearly experienced heat stroke. He drinks about four glasses of water a day and reports a dry mouth as a persistent issue.  No chest pain associated with his episodes of nausea and vomiting. He has a history of reflux for which he takes omeprazole   daily. He also takes Effexor  (venlafaxine ) for depression, which he has been on for five to six years, and reports increased depression and anxiety recently.  He has a history of eczema for which he uses Dupixent . He has not smoked for 57 years and has no military history. 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      Review of Systems A 10 point review of systems was performed and it is as noted above otherwise negative.   Past Medical History:  Diagnosis Date   Actinic keratosis 07/26/2007   R dorsum index finger - bx proven    Actinic keratosis 01/25/2008   L inf helix - bx proven    Actinic keratosis 05/16/2009   L lat canthus - bx proven    Anxiety    GERD (gastroesophageal reflux disease)    Vertigo 06/2014   had an episode in 2013.    Past Surgical History:  Procedure Laterality Date   ROTATOR CUFF REPAIR Left 06/2014   SHOULDER ARTHROSCOPY Left 06/27/2014   Procedure: ARTHROSCOPY SHOULDER- mini open rotator cuff repair;  Surgeon: Franky Cranker, MD;  Location: [facility] ORS;  Service: Orthopedics;  Laterality: Left;   SHOULDER OPEN ROTATOR CUFF REPAIR Right 2011    Patient Active Problem List   Diagnosis Date Noted   Acute bacterial bronchitis 11/25/2022   Superficial thrombophlebitis 06/14/2022   Nausea 05/28/2022   DOE (dyspnea on exertion) 05/28/2022   Other nonthrombocytopenic  purpura (HCC) 05/21/2022   History of dizziness 05/15/2022   Raynaud's phenomenon without gangrene 03/31/2022   Poor balance 03/31/2022   Complex renal cyst 08/20/2021   Chronic kidney disease (CKD) stage G3a/A2 07/11/2021   Elevated serum creatinine 07/11/2021   Annual physical exam 07/10/2021   Atherosclerotic PVD with intermittent claudication (HCC) 07/10/2021   Senile purpura (HCC) 07/10/2021   Anxiety and depression 07/10/2021   Fatigue 07/10/2021   Fall 07/10/2021   Elevated LDL cholesterol level 07/10/2021   Gastroesophageal reflux  disease 04/07/2021   Bilateral primary osteoarthritis of first carpometacarpal joints 08/31/2016   Leg varices 09/06/2009   Allergic rhinitis 06/17/2007    Family History  Problem Relation Age of Onset   Kidney failure Mother        15s   Dementia Father    Heart disease Sister    Heart Problems Sister    Cancer Maternal Grandfather    Cancer Paternal Grandmother    Cancer Paternal Grandfather     Social History   Tobacco Use   Smoking status: Former    Current packs/day: 0.00    Average packs/day: 0.5 packs/day for 11.4 years (5.7 ttl pk-yrs)    Types: Cigarettes    Start date: 02/09/1957    Quit date: 06/18/1968    Years since quitting: 55.2   Smokeless tobacco: Never  Substance Use Topics   Alcohol use: Yes    Alcohol/week: 4.0 standard drinks of alcohol    Types: 4 Cans of beer per week    Comment: occasionally    Allergies  Allergen Reactions   Sulfa Antibiotics    Elemental Sulfur Rash    Current Meds  Medication Sig   aspirin EC 81 MG tablet Take 81 mg by mouth daily. Swallow whole.   clobetasol  (TEMOVATE ) 0.05 % external solution MIX CLOBETASOL  SOLUTION IN A JAR OF CERAVE CREAM. APPLY TO AFFECTED AREAS OF RASH TWICE DAILY UNTIL IMPROVED. AVOID FACE, GROIN, AND AXILLA   dupilumab  (DUPIXENT ) 300 MG/2ML prefilled syringe Inject 300 mg into the skin every 14 (fourteen) days. For maintenance.   meloxicam  (MOBIC ) 15 MG tablet Take 15 mg by mouth daily.   montelukast  (SINGULAIR ) 10 MG tablet Take 1 tablet (10 mg total) by mouth at bedtime.   omeprazole  (PRILOSEC) 20 MG capsule Take 2 capsules (40 mg total) by mouth daily.   sildenafil  (REVATIO ) 20 MG tablet TAKE 2 TO 5 TABLETS by mouth once daily as needed to obtain/maintain erection.   venlafaxine  XR (EFFEXOR -XR) 150 MG 24 hr capsule TAKE 2 CAPSULES BY MOUTH ONCE DAILY WITH BREAKFAST (NEEDS  APPOINTMENT  FOR  FURTHER  REFILLS)    Immunization History  Administered Date(s) Administered    sv, Bivalent, Protein  Subunit Rsvpref,pf (Abrysvo) 11/08/2021   Fluad Quad(high Dose 65+) 11/08/2021   Influenza Split 12/01/2010   Influenza, High Dose Seasonal PF 11/04/2013, 11/11/2015, 11/06/2016, 10/15/2018, 10/15/2019, 10/02/2022   Influenza,inj,Quad PF,6+ Mos 10/22/2012   Influenza-Unspecified 10/20/2017   PFIZER(Purple Top)SARS-COV-2 Vaccination 03/02/2019, 03/23/2019, 01/22/2020   Pfizer Covid-19 Vaccine Bivalent Booster 66yrs & up 11/08/2020   Pneumococcal Conjugate-13 07/13/2013   Pneumococcal Polysaccharide-23 12/01/2010   Td 08/31/2017   Zoster Recombinant(Shingrix ) 08/12/2023   Zoster, Live 07/13/2013        Objective:     BP 116/60 (BP Location: Left Arm, Patient Position: Sitting, Cuff Size: Normal)   Pulse 92   Temp 97.6 F (36.4 C) (Oral)   Ht 5' 11 (1.803 m)   Wt 191 lb 3.2 oz (  86.7 kg)   SpO2 96%   BMI 26.67 kg/m   SpO2: 96 %  GENERAL: Well-developed, well-nourished gentleman, no acute distress.  Fully ambulatory, no conversational dyspnea. HEAD: Normocephalic, atraumatic.  EYES: Pupils equal, round, reactive to light.  No scleral icterus.  MOUTH: Dentition intact, oral mucosa moist.  No thrush. NECK: Supple. No thyromegaly. Trachea midline. No JVD.  No adenopathy. PULMONARY: Good air entry bilaterally.  No adventitious sounds. CARDIOVASCULAR: S1 and S2. Regular rate and rhythm.  No rubs, murmurs or gallops heard. ABDOMEN: Benign. MUSCULOSKELETAL: No joint deformity, no clubbing, no edema.  NEUROLOGIC: No overt focal deficit, no gait disturbance, speech is fluent. SKIN: Intact,warm,dry. PSYCH: Mood and behavior normal  Lab Results  Component Value Date   NITRICOXIDE 14 09/15/2023  *No evidence of type II inflammation noted  Reviewed all recent laboratory data, previous eosinophil count 500 cells per microliter.  CT angio chest performed on August 2024 showed no abnormalities with regards to the lungs.  No pulmonary embolus.   Assessment & Plan:     ICD-10-CM    1. Shortness of breath  R06.02 Nitric oxide     Pulmonary function test    2. Asthma, unspecified asthma severity, unspecified whether complicated, unspecified whether persistent  J45.909 Pulmonary function test      Orders Placed This Encounter  Procedures   Nitric oxide    Pulmonary function test    Standing Status:   Future    Expiration Date:   09/14/2024    Where should this test be performed?:   Outpatient Pulmonary    What type of PFT is being ordered?:   Full PFT   Meds ordered this encounter  Medications   albuterol  (VENTOLIN  HFA) 108 (90 Base) MCG/ACT inhaler    Sig: Inhale 2 puffs into the lungs every 6 (six) hours as needed.    Dispense:  8 g    Refill:  2   Discussion:    Late-onset asthma Symptoms of shortness of breath and weakness for approximately three years, with seasonal variation, with nausea and vomiting when overheated. Previous CT scan of the chest was normal. Symptoms suggestive of asthma, supported by improvement with Advair inhaler. Dupixent  may be controlling airway inflammation. Late-onset asthma possibly exacerbated by previous COVID-19 infection. Seasonal pattern and response to Advair suggest asthma. - Continue Advair inhaler and ensure proper mouth rinsing after use. - Discontinue Singulair  due to potential worsening of depression. - Order breathing tests to further evaluate lung function. - Prescribe a rescue inhaler for emergency use in case of wheezing, chest tightness, or cough.  Major depressive disorder Reports increased depression and anxiety recently. Singulair  may contribute to worsening depression. Currently on Effexor  for depression. - Discontinue Singulair  due to potential worsening of depression.     Advised if symptoms do not improve or worsen, to please contact office for sooner follow up or seek emergency care.    I spent 60 minutes of dedicated to the care of this patient on the date of this encounter to include pre-visit review of  records, face-to-face time with the patient discussing conditions above, post visit ordering of testing, clinical documentation with the electronic health record, making appropriate referrals as documented, and communicating necessary findings to members of the patients care team.   C. Leita Sanders, MD Advanced Bronchoscopy PCCM [facility]    *This note was dictated using voice recognition software/Dragon.  Despite best efforts to proofread, errors can occur which can change the meaning. Any transcriptional errors that result  from this process are unintentional and may not be fully corrected at the time of dictation.

## 2023-09-15 NOTE — Patient Instructions (Signed)
 VISIT SUMMARY:  Edward Rodriguez, an 83 year old male, came in today due to experiencing shortness of breath and weakness, particularly during the summer months. He also reported nausea and vomiting when overheated, which has affected his ability to play golf. He has been using an Advair inhaler and taking Singulair  at night, which have provided some relief. He also mentioned a history of dehydration and increased depression and anxiety recently.  YOUR PLAN:  -LATE-ONSET ASTHMA: Late-onset asthma is a condition where asthma symptoms begin later in life. Your symptoms of shortness of breath and weakness, especially during the summer, suggest asthma, which has been somewhat managed with the Advair inhaler. We will continue the Advair inhaler and ensure you rinse your mouth after each use. We will discontinue Singulair  as it may worsen your depression. Breathing tests will be ordered to further evaluate your lung function, and a rescue inhaler will be prescribed for emergency use in case of wheezing, chest tightness, or cough.  -MAJOR DEPRESSIVE DISORDER: Major depressive disorder is a mental health condition characterized by persistent feelings of sadness and loss of interest. You have reported increased depression and anxiety recently. We will discontinue Singulair  as it may contribute to worsening depression. You will continue taking Effexor  for your depression.  INSTRUCTIONS:  Please continue using the Advair inhaler and rinse your mouth after each use. Discontinue Singulair  immediately. We will order breathing tests to further evaluate your lung function. Use the prescribed rescue inhaler in case of wheezing, chest tightness, or cough. Continue taking Effexor  for your depression. Follow up with us  if your symptoms persist or worsen.

## 2023-09-16 ENCOUNTER — Encounter: Payer: Self-pay | Admitting: Family Medicine

## 2023-09-16 ENCOUNTER — Ambulatory Visit (INDEPENDENT_AMBULATORY_CARE_PROVIDER_SITE_OTHER): Admitting: Family Medicine

## 2023-09-16 VITALS — BP 113/77 | HR 87 | Temp 97.7°F | Ht 71.0 in | Wt 193.3 lb

## 2023-09-16 DIAGNOSIS — R110 Nausea: Secondary | ICD-10-CM

## 2023-09-16 DIAGNOSIS — F32A Depression, unspecified: Secondary | ICD-10-CM

## 2023-09-16 DIAGNOSIS — R053 Chronic cough: Secondary | ICD-10-CM

## 2023-09-16 DIAGNOSIS — F419 Anxiety disorder, unspecified: Secondary | ICD-10-CM | POA: Diagnosis not present

## 2023-09-16 DIAGNOSIS — K219 Gastro-esophageal reflux disease without esophagitis: Secondary | ICD-10-CM

## 2023-09-16 DIAGNOSIS — R0609 Other forms of dyspnea: Secondary | ICD-10-CM

## 2023-09-16 NOTE — Progress Notes (Signed)
 Established Patient Office Visit  Introduced to nurse practitioner role and practice setting.  All questions answered.  Discussed provider/patient relationship and expectations.  Subjective   Patient ID: Edward Rodriguez, male    DOB: 1940-03-09  Age: 83 y.o. MRN: [id number]  Chief Complaint  Patient presents with   Medical Management of Chronic Issues    - Patient reports that he went to see his pulmonologist yesterday and he would like to discuss what that visit was about. - No other concerns.     Discussed the use of AI scribe software for clinical note transcription with the patient, who gave verbal consent to proceed.  History of Present Illness Edward Rodriguez is an 83 year old male who presents for chronic disease follow-up.  GAD - no concerns, happy with Effexor   Respiratory symptoms; chronic cough  - Experiences intermittent breathing difficulties the last several months - Seen by Pulmonary yesterday - Added on prn Albuterol  - Plans for breathing test  - Continues daily advair  Gastrointestinal symptoms - Nausea is improving.  Functional status - Monitors physical activity, such as playing golf, to assess health status. -excited for weather to be better with heat going down.      09/16/2023    8:16 AM 08/12/2023    9:51 AM 06/16/2023    8:42 AM  Depression screen PHQ 2/9  Decreased Interest 1 0 0  Down, Depressed, Hopeless 1 0 1  PHQ - 2 Score 2 0 1  Altered sleeping 1 2 1   Tired, decreased energy 1 1 3   Change in appetite 1 0 1  Feeling bad or failure about yourself  1 1 1   Trouble concentrating 1 1 2   Moving slowly or fidgety/restless 0 0 0  Suicidal thoughts 0 0 0  PHQ-9 Score 7 5 9   Difficult doing work/chores Not difficult at all Not difficult at all Somewhat difficult       09/16/2023    8:16 AM 08/12/2023    9:51 AM 06/16/2023    8:42 AM 06/09/2019    5:29 PM  GAD 7 : Generalized Anxiety Score  Nervous, Anxious, on Edge 1 1 2  0  Control/stop worrying 1 1 2 1    Worry too much - different things 1 1 1  0  Trouble relaxing 1 1 1 2   Restless 0 0 0 3  Easily annoyed or irritable 0 0 1 0  Afraid - awful might happen 0 0 0 0  Total GAD 7 Score 4 4 7 6   Anxiety Difficulty Not difficult at all Not difficult at all Somewhat difficult Somewhat difficult    Review of Systems  All other systems reviewed and are negative.   Negative unless indicated in HPI   Objective:     BP 113/77 (BP Location: Right Arm, Patient Position: Sitting, Cuff Size: Normal)   Pulse 87   Temp 97.7 F (36.5 C) (Oral)   Ht 5' 11 (1.803 m)   Wt 193 lb 4.8 oz (87.7 kg)   SpO2 99%   BMI 26.96 kg/m    Physical Exam Constitutional:      General: He is not in acute distress.    Appearance: Normal appearance. He is not ill-appearing, toxic-appearing or diaphoretic.  HENT:     Head: Normocephalic.     Nose: Nose normal.     Mouth/Throat:     Mouth: Mucous membranes are moist.  Eyes:     Extraocular Movements: Extraocular movements intact.     Conjunctiva/sclera: Conjunctivae  normal.     Pupils: Pupils are equal, round, and reactive to light.  Cardiovascular:     Rate and Rhythm: Normal rate and regular rhythm.     Heart sounds: No murmur heard.    No friction rub. No gallop.  Pulmonary:     Effort: Pulmonary effort is normal. No respiratory distress.     Breath sounds: Normal breath sounds. No stridor. No wheezing, rhonchi or rales.  Chest:     Chest wall: No tenderness.  Musculoskeletal:     Right lower leg: No edema.     Left lower leg: No edema.  Skin:    General: Skin is warm and dry.     Capillary Refill: Capillary refill takes less than 2 seconds.  Neurological:     General: No focal deficit present.     Mental Status: He is alert and oriented to person, place, and time. Mental status is at baseline.     Cranial Nerves: No cranial nerve deficit.     Sensory: No sensory deficit.     Motor: No weakness.     Coordination: Coordination normal.      Gait: Gait normal.  Psychiatric:        Mood and Affect: Mood normal.        Behavior: Behavior normal.        Thought Content: Thought content normal.        Judgment: Judgment normal.      No results found for any visits on 09/16/23.    The ASCVD Risk score (Arnett DK, et al., 2019) failed to calculate for the following reasons:   The 2019 ASCVD risk score is only valid for ages 52 to 25    Assessment & Plan:  There are no diagnoses linked to this encounter.   Assessment and Plan Assessment & Plan Chronic Cough with Dyspnea during activity Improving Suspected asthma (pending pulmonary function testing) Suspected asthma with respiratory issues, including post-COVID complications and recurrent laryngitis. Differential includes COPD vs asthma vs reactive airway  - Perform pulmonary function testing on October 13th to confirm diagnosis. - Continue Advair daily. - Use as-needed albuterol  inhaler for symptom relief. - Ensure rinsing mouth after use of Advair  Controlled GERD - Continue Omeprazole  40mg  daily  Anxiety & depression disorder Anxiety managed with venlafaxine , effective in symptom control. - Continue venlafaxine  (Effexor ) for anxiety management.  Nausea, improving Nausea improving since last visit. - will hold on any labs until next visit - appears to be secondary to coughing fro suspected late onset asthma  Return in about 2 months (around 11/16/2023) for chronic disease mgmt.   I, Curtis DELENA Boom, FNP, have reviewed all documentation for this visit. The documentation on 09/16/23 for the exam, diagnosis, procedures, and orders are all accurate and complete.   Curtis DELENA Boom, FNP

## 2023-09-23 ENCOUNTER — Other Ambulatory Visit (HOSPITAL_COMMUNITY): Payer: Self-pay

## 2023-09-23 ENCOUNTER — Telehealth: Payer: Self-pay | Admitting: Pharmacy Technician

## 2023-09-23 NOTE — Telephone Encounter (Signed)
 Pharmacy Patient Advocate Encounter   Received notification from CoverMyMeds that prior authorization for Sildenafil  20mg  is required/requested.   Insurance verification completed.   The patient is insured through [facility] [employer] .   Per test claim: Not covered by pt's ins. He can get this from one of the [facility] for $9.

## 2023-10-12 ENCOUNTER — Other Ambulatory Visit: Payer: Self-pay

## 2023-10-12 DIAGNOSIS — L2081 Atopic neurodermatitis: Secondary | ICD-10-CM

## 2023-10-12 MED ORDER — DUPIXENT 300 MG/2ML ~~LOC~~ SOSY: 300.0000 mg | PREFILLED_SYRINGE | SUBCUTANEOUS | 1 refills | Status: DC

## 2023-10-26 ENCOUNTER — Ambulatory Visit (INDEPENDENT_AMBULATORY_CARE_PROVIDER_SITE_OTHER)

## 2023-10-26 DIAGNOSIS — L209 Atopic dermatitis, unspecified: Secondary | ICD-10-CM

## 2023-10-26 MED ORDER — CLOBETASOL PROPIONATE 0.05 % EX OINT: TOPICAL_OINTMENT | CUTANEOUS | 5 refills | Status: AC

## 2023-10-26 MED ORDER — TRIAMCINOLONE ACETONIDE 0.1 % EX OINT: TOPICAL_OINTMENT | CUTANEOUS | 2 refills | Status: AC

## 2023-10-26 NOTE — Patient Instructions (Addendum)
 Steroid Use  We prescribed you a topical steroid at today's visit.   General application instructions: -Apply this to any affected skin areas, twice (2 times) daily, for two (2) weeks -If the areas are better, you can stop -Re-start the topical steroid if the areas come back, or flare -If the areas don't get better after two weeks, we sometimes recommend taking a break for one (1) week, before restarting for another two (2) weeks. Repeat as needed  The most common side effects of topical steroid medications include changes in skin pigment and thinning of the skin. If the steroid is only applied to affected areas of the skin, these effects rarely occur unless the steroid is used for a very long time (years without stopping).   If we prescribed you a strong steroid, please avoid applying to face, groin, or neck, unless we tell you otherwise. We will include more detail in your prescription instructions.    Due to recent changes in healthcare laws, you may see results of your pathology and/or laboratory studies on MyChart before the doctors have had a chance to review them. We understand that in some cases there may be results that are confusing or concerning to you. Please understand that not all results are received at the same time and often the doctors may need to interpret multiple results in order to provide you with the best plan of care or course of treatment. Therefore, we ask that you please give us  2 business days to thoroughly review all your results before contacting the office for clarification. Should we see a critical lab result, you will be contacted sooner.   If You Need Anything After Your Visit  If you have any questions or concerns for your doctor, please call our main line at [phone] and press option 4 to reach your doctor's medical assistant. If no one answers, please leave a voicemail as directed and we will return your call as soon as possible. Messages left after  4 pm will be answered the following business day.   You may also send us  a message via MyChart. We typically respond to MyChart messages within 1-2 business days.  For prescription refills, please ask your pharmacy to contact our office. Our fax number is [phone].  If you have an urgent issue when the clinic is closed that cannot wait until the next business day, you can page your doctor at the number below.    Please note that while we do our best to be available for urgent issues outside of office hours, we are not available 24/7.   If you have an urgent issue and are unable to reach us , you may choose to seek medical care at your doctor's office, retail clinic, urgent care center, or emergency room.  If you have a medical emergency, please immediately call 911 or go to the emergency department.  Pager Numbers  - Dr. Hester: [phone]  - Dr. Jackquline: [phone]  - Dr. Claudene: [phone]   - Dr. Raymund: [phone]  In the event of inclement weather, please call our main line at [phone] for an update on the status of any delays or closures.  Dermatology Medication Tips: Please keep the boxes that topical medications come in in order to help keep track of the instructions about where and how to use these. Pharmacies typically print the medication instructions only on the boxes and not directly on the medication tubes.   If your medication is too expensive, please contact our  office at [phone] option 4 or send us  a message through MyChart.   We are unable to tell what your co-pay for medications will be in advance as this is different depending on your insurance coverage. However, we may be able to find a substitute medication at lower cost or fill out paperwork to get insurance to cover a needed medication.   If a prior authorization is required to get your medication covered by your insurance company, please allow us  1-2 business days to complete this  process.  Drug prices often vary depending on where the prescription is filled and some pharmacies may offer cheaper prices.  The website www.goodrx.com contains coupons for medications through different pharmacies. The prices here do not account for what the cost may be with help from insurance (it may be cheaper with your insurance), but the website can give you the price if you did not use any insurance.  - You can print the associated coupon and take it with your prescription to the pharmacy.  - You may also stop by our office during regular business hours and pick up a GoodRx coupon card.  - If you need your prescription sent electronically to a different pharmacy, notify our office through [facility] or by phone at [phone] option 4.     Si Usted Necesita Algo Despus de Su Visita  Tambin puede enviarnos un mensaje a travs de Clinical cytogeneticist. Por lo general respondemos a los mensajes de MyChart en el transcurso de 1 a 2 das hbiles.  Para renovar recetas, por favor pida a su farmacia que se ponga en contacto con nuestra oficina. Randi lakes de fax es [place] [phone].  Si tiene un asunto urgente cuando la clnica est cerrada y que no puede esperar hasta el siguiente da hbil, puede llamar/localizar a su doctor(a) al nmero que aparece a continuacin.   Por favor, tenga en cuenta que aunque hacemos todo lo posible para estar disponibles para asuntos urgentes fuera del horario de [place], no estamos disponibles las 24 horas del da, los 7 [address]  [address] de la [place].   Si tiene un problema urgente y no puede comunicarse con nosotros, puede optar por buscar atencin mdica  en el consultorio de su doctor(a), en una clnica privada, en un centro de atencin urgente o en una sala de emergencias.  Si tiene Engineer, drilling, por favor llame inmediatamente al 911 o vaya a la sala de emergencias.  Nmeros de bper  - Dr. Hester: [phone]  - Dra. Jackquline: [phone]  - Dr.  Claudene: [phone]  - Dra. Kitts: [phone]  En caso de inclemencias del [place], por favor llame a nuestra lnea principal al [phone] para una actualizacin sobre el estado de cualquier retraso o cierre.  Consejos para la medicacin en dermatologa: Por favor, guarde las cajas en las que vienen los medicamentos de uso tpico para ayudarle a seguir las instrucciones sobre dnde y cmo usarlos. Las farmacias generalmente imprimen las instrucciones del medicamento slo en las cajas y no directamente en los tubos del [place].   Si su medicamento es muy caro, por favor, pngase en contacto con landry rieger llamando al [phone] y presione la opcin 4 o envenos un mensaje a travs de Clinical cytogeneticist.   No podemos decirle cul ser su copago por los medicamentos por adelantado ya que esto es diferente dependiendo de la cobertura de su seguro. Sin embargo, es posible que podamos encontrar un medicamento sustituto a Audiological scientist un formulario para que el  seguro cubra el medicamento que se considera necesario.   Si se requiere una autorizacin previa para que su compaa de seguros malta su medicamento, por favor permtanos de 1 a 2 das hbiles para completar este proceso.  Los precios de los medicamentos varan con frecuencia dependiendo del Environmental consultant de dnde se surte la receta y alguna farmacias pueden ofrecer precios ms baratos.  El sitio web www.goodrx.com tiene cupones para medicamentos de Health and safety inspector. Los precios aqu no tienen en cuenta lo que podra costar con la ayuda del seguro (puede ser ms barato con su seguro), pero el sitio web puede darle el precio si no utiliz Tourist information centre manager.  - Puede imprimir el cupn correspondiente y llevarlo con su receta a la farmacia.  - Tambin puede pasar por nuestra oficina durante el horario de atencin regular y Education officer, museum una tarjeta de cupones de GoodRx.  - Si necesita que su receta se enve electrnicamente a una farmacia diferente,  informe a nuestra oficina a travs de MyChart de [place] o por telfono llamando al [phone] y presione la opcin 4.

## 2023-10-26 NOTE — Progress Notes (Signed)
 Subjective   Edward Rodriguez is a 83 y.o. male who presents for the following: Rash. Patient is established patient   Today patient reports: Atopic dermatitis flare; patient is currently taking Dupixent  300mg  injections every 2 weeks. He has been on this for about 2 years; this is the worst flare he's had since starting medication. Current flare started about 2 months ago but worsened over the past month. He is currently using Cerave mixed with Clobetasol . His most recent Dupixent  injection was yesterday morning. Most bothered by itching on sides.  Review of Systems:    No other skin or systemic complaints except as noted in HPI or Assessment and Plan.  The following portions of the chart were reviewed this encounter and updated as appropriate: medications, allergies, medical history  Relevant Medical History:  Personal history of actinic keratosis   Objective  Well appearing patient in no apparent distress; mood and affect are within normal limits. Examination was performed of the: Waist Up Skin Exam: scalp, head, eyes, ears, nose, lips, neck, chest, axillae, upper extremities, abdomen, back, hands, fingers, fingernails   Examination notable for: Atopic dermatitis: Diffuse xerosis with erythematous plaques on the on the trunk  - R trunk primarily  Examination limited by: Undergarments, Clothing, and Patient deferred removal                      Assessment & Plan   Atopic dermatitis, previously severe, improving on dupixent  but with recent flare/not at goal  Asthma - follows with pulmonology  Chronic and persistent condition with duration or expected duration over one year. Condition is symptomatic and bothersome to patient. Patient is flaring and not currently at treatment goal.   - Diagnosis, treatment options, prognosis, risk/ benefit, and side effects of treatment were discussed with the patient.  - Reviewed benign but chronic nature of disease. - Discussed dry skin  care at length, recommended avoidance of fragrances, short showers with luke- warm water, no scrubbing, an unscented moisturizing soap (e.g. Dove sensitive skin) limited to the groin and axillae, and frequent emollient use (Eucerin, Aquaphor, Cerave, Vanicream, Vaseline). - Discussed treatment with topical steroids, non steroidal topicals, systemics (dupixent , tralokinumab, nemolizumab, rinvoq)  - For mild areas: start triamcinolone  0.1% ointment twice daily - For severe areas not responsive to tac: start clobetasol  0.5% ointment twice daily - Discontinue Clobetasol  solution mixed with Cerave  - Continue Dupixent  300mg  injections every two weeks.  - Discussed could consider transition to alternate biologic - deferred for now   Condition requires long term medication management.  Patient is using long term (months to years) prescription medication  to control their dermatologic condition.  These medications require periodic monitoring to evaluate for efficacy and side effects and may require periodic laboratory monitoring.    Procedures, orders, diagnosis for this visit:  ATOPIC DERMATITIS, UNSPECIFIED TYPE    Atopic dermatitis, unspecified type  Other orders -     Clobetasol  Propionate; Apply 1 gram topically to affected area of skin twice daily. Stop once resolved and restart as needed for flares. Avoid use on face, armpits, groin unless otherwise indicated.  Dispense: 60 g; Refill: 5 -     Triamcinolone  Acetonide; Apply 7 grams twice daily to affected areas of skin. Stop once resolved and restart as needed for flares. Avoid use on face, armpits, groin unless otherwise indicated.  Dispense: 454 g; Refill: 2    Return to clinic: Return in about 4 weeks (around 11/23/2023) for Atpoic Derm.  Documentation:  I, Edward Rodriguez, CMA am acting as scribe for Edward JAYSON Kanaris, MD  I have reviewed the above documentation for accuracy and completeness, and I agree with the above.  Edward JAYSON Kanaris,  MD

## 2023-11-12 ENCOUNTER — Ambulatory Visit: Admitting: Family Medicine

## 2023-11-15 ENCOUNTER — Encounter: Payer: Self-pay | Admitting: Family Medicine

## 2023-11-15 ENCOUNTER — Ambulatory Visit (INDEPENDENT_AMBULATORY_CARE_PROVIDER_SITE_OTHER): Admitting: Family Medicine

## 2023-11-15 VITALS — BP 112/60 | HR 92

## 2023-11-15 DIAGNOSIS — J4540 Moderate persistent asthma, uncomplicated: Secondary | ICD-10-CM | POA: Diagnosis not present

## 2023-11-15 DIAGNOSIS — F419 Anxiety disorder, unspecified: Secondary | ICD-10-CM | POA: Diagnosis not present

## 2023-11-15 DIAGNOSIS — K219 Gastro-esophageal reflux disease without esophagitis: Secondary | ICD-10-CM | POA: Diagnosis not present

## 2023-11-15 DIAGNOSIS — R413 Other amnesia: Secondary | ICD-10-CM | POA: Diagnosis not present

## 2023-11-15 DIAGNOSIS — F32A Depression, unspecified: Secondary | ICD-10-CM | POA: Diagnosis not present

## 2023-11-15 NOTE — Progress Notes (Signed)
 Established Patient Office Visit  Introduced to nurse practitioner role and practice setting.  All questions answered.  Discussed provider/patient relationship and expectations.   Subjective   Patient ID: Edward Rodriguez, male    DOB: 1940-07-20  Age: 83 y.o. MRN: [id number]  Chief Complaint  Patient presents with   Follow-up    3 month f/u    Discussed the use of AI scribe software for clinical note transcription with the patient, who gave verbal consent to proceed.  History of Present Illness Edward Rodriguez is an 83 year old male with late-stage asthma who presents for chronic disease follow up.  He has ongoing memory concerns, noting that he has had trouble with it 'forever' but feels it is the same as before. He mentions his birthday is in [birth year], and he has family members with birthdays in the same month.  SOB - previous ECHO normal. SOB improved since may, denies any nausea or vomiting now. More pronounced when he is outside, such as when working in the garden, where he can work for about three to five minutes before needing to rest. No recent nausea, and the cooler weather has made it easier to manage his symptoms. He has completed one inhaler and is using albuterol  prn. He reports that he is scheduled for a breathing test with pulmonary next Monday.  He is able to walk better and is less short of breath during activities like attending a wedding, attributing some improvement to the cooler weather.      11/15/2023    9:53 AM 09/16/2023    8:16 AM 08/12/2023    9:51 AM  Depression screen PHQ 2/9  Decreased Interest 0 1 0  Down, Depressed, Hopeless 1 1 0  PHQ - 2 Score 1 2 0  Altered sleeping 1 1 2   Tired, decreased energy 1 1 1   Change in appetite 0 1 0  Feeling bad or failure about yourself  1 1 1   Trouble concentrating 1 1 1   Moving slowly or fidgety/restless 0 0 0  Suicidal thoughts 0 0 0  PHQ-9 Score 5 7 5   Difficult doing work/chores Not difficult at all Not difficult at all  Not difficult at all       11/15/2023    9:54 AM 09/16/2023    8:16 AM 08/12/2023    9:51 AM 06/16/2023    8:42 AM  GAD 7 : Generalized Anxiety Score  Nervous, Anxious, on Edge 1 1 1 2   Control/stop worrying 1 1 1 2   Worry too much - different things 1 1 1 1   Trouble relaxing 1 1 1 1   Restless 1 0 0 0  Easily annoyed or irritable 0 0 0 1  Afraid - awful might happen 0 0 0 0  Total GAD 7 Score 5 4 4 7   Anxiety Difficulty Not difficult at all Not difficult at all Not difficult at all Somewhat difficult     ROS  Negative unless indicated in HPI   Objective:     BP 112/60   Pulse 92   SpO2 98%    Physical Exam Constitutional:      General: He is not in acute distress.    Appearance: Normal appearance. He is not ill-appearing, toxic-appearing or diaphoretic.  HENT:     Head: Normocephalic.     Nose: Nose normal.     Mouth/Throat:     Mouth: Mucous membranes are moist.  Eyes:     Extraocular Movements: Extraocular movements intact.  Conjunctiva/sclera: Conjunctivae normal.     Pupils: Pupils are equal, round, and reactive to light.  Cardiovascular:     Rate and Rhythm: Normal rate and regular rhythm.     Heart sounds: No murmur heard.    No friction rub. No gallop.  Pulmonary:     Effort: Pulmonary effort is normal. No respiratory distress.     Breath sounds: Normal breath sounds. No stridor. No wheezing, rhonchi or rales.  Chest:     Chest wall: No tenderness.  Musculoskeletal:     Right lower leg: No edema.     Left lower leg: No edema.  Skin:    General: Skin is warm and dry.     Capillary Refill: Capillary refill takes less than 2 seconds.  Neurological:     General: No focal deficit present.     Mental Status: He is alert and oriented to person, place, and time. Mental status is at baseline.     Cranial Nerves: No cranial nerve deficit.     Sensory: No sensory deficit.     Motor: No weakness.     Coordination: Coordination normal.     Gait: Gait normal.   Psychiatric:        Mood and Affect: Mood normal.        Behavior: Behavior normal.        Thought Content: Thought content normal.        Judgment: Judgment normal.      No results found for any visits on 11/15/23.    The ASCVD Risk score (Arnett DK, et al., 2019) failed to calculate for the following reasons:   The 2019 ASCVD risk score is only valid for ages 89 to 71    Assessment & Plan:  Anxiety and depression  Memory difficulties -     Ambulatory referral to Neurology  Moderate persistent late onset asthma without complication  Gastroesophageal reflux disease, unspecified whether esophagitis present     Assessment and Plan Assessment & Plan Asthma Chronic asthma with intermittent exacerbations. Reports improvement in symptoms since May, with reduced shortness of breath and no nausea. Shortness of breath persists, especially with outdoor activities, requiring rest after 3-5 minutes of exertion. Awaiting pulmonary function test to differentiate between asthma and COPD and to guide medication adjustments. Previous inhaler regimen completed, currently using a different inhaler as instructed by a previous provider. - Perform pulmonary function test on November 22, 2023. - Review pulmonary function test results to determine appropriate medication adjustments. - breath sounds clear on exam, pt on in distress, on DOB or SOB during visit. - Continue Advair daily - Continue prn albuterol  - Rinse mouth/swish with water after advair use.  - Recommend flu and COVID vaccines.  Memory concerns/difficulties Ongoing memory concerns, but no significant changes reported. - referral to neurology for further assessment - denies concerns from family, but patient has concerns - appears more short term issues - 6CIT was normal at previous appt.   Anxiety and Depression - continue Effexor   GERD - continue omeprazole    Return in about 4 months (around 03/17/2024) for Chronic Disease  Mgmt.   I, Curtis DELENA Boom, FNP, have reviewed all documentation for this visit. The documentation on 11/15/23 for the exam, diagnosis, procedures, and orders are all accurate and complete.   Curtis DELENA Boom, FNP

## 2023-11-22 ENCOUNTER — Ambulatory Visit: Admitting: Pulmonary Disease

## 2023-11-22 ENCOUNTER — Encounter

## 2023-11-23 ENCOUNTER — Ambulatory Visit

## 2023-11-23 DIAGNOSIS — L570 Actinic keratosis: Secondary | ICD-10-CM | POA: Diagnosis not present

## 2023-11-23 DIAGNOSIS — W908XXA Exposure to other nonionizing radiation, initial encounter: Secondary | ICD-10-CM

## 2023-11-23 DIAGNOSIS — L2084 Intrinsic (allergic) eczema: Secondary | ICD-10-CM

## 2023-11-23 DIAGNOSIS — L578 Other skin changes due to chronic exposure to nonionizing radiation: Secondary | ICD-10-CM

## 2023-11-23 DIAGNOSIS — L209 Atopic dermatitis, unspecified: Secondary | ICD-10-CM | POA: Diagnosis not present

## 2023-11-23 NOTE — Patient Instructions (Signed)

## 2023-11-23 NOTE — Progress Notes (Signed)
 Subjective   Edward Rodriguez is a 83 y.o. male who presents for the following: Follow up of atopic dermatitis. Patient is established patient   Today patient reports: 2 week follow up of atopic dermatitis. Patient states symptoms have improved since last visit.   Patient also reports an area of concern on the scalp.   Review of Systems:    No other skin or systemic complaints except as noted in HPI or Assessment and Plan.  The following portions of the chart were reviewed this encounter and updated as appropriate: medications, allergies, medical history  Relevant Medical History:  Personal history of actinic keratosis   Objective  Well appearing patient in no apparent distress; mood and affect are within normal limits. Examination was performed of the: Sun Exposed Exam: Scalp, head, eyes, ears, nose, lips, neck, upper extremities, hands, fingers, fingernails  Examination notable for: Actinic keratosis: Scaly erythematous macule(s) concentrated on sun exposed areas   Some erythematous papules, patches  Examination limited by: Clothing and Patient deferred removal     Scalp (6) Pink scaly macules  Assessment & Plan   Atopic dermatitis, previously severe, improving on dupixent  and is currently at treatment goal with addition of topical steroid  Asthma - follows with pulmonology  Chronic and persistent condition with duration or expected duration over one year. Condition is symptomatic and bothersome to patient.   - Diagnosis, treatment options, prognosis, risk/ benefit, and side effects of treatment were discussed with the patient.  - Reviewed benign but chronic nature of disease. - Discussed dry skin care at length, recommended avoidance of fragrances, short showers with luke- warm water, no scrubbing, an unscented moisturizing soap (e.g. Dove sensitive skin) limited to the groin and axillae, and frequent emollient use (Eucerin, Aquaphor, Cerave, Vanicream, Vaseline). - Discussed  treatment with topical steroids, non steroidal topicals, systemics (dupixent , tralokinumab, nemolizumab, rinvoq)  - For mild areas: triamcinolone  0.1% ointment twice daily as needed - For severe areas not responsive to tac: clobetasol  0.5% ointment twice daily as needed  - Continue Dupixent  300mg  injections every two weeks.   ACTINIC DAMAGE - Chronic condition, secondary to cumulative UV/sun exposure - Recommend daily broad spectrum sunscreen SPF 30+ to sun-exposed areas, reapply every 2 hours as needed.  - Staying in the shade or wearing long sleeves, sun glasses (UVA+UVB protection) and wide brim hats (4-inch brim around the entire circumference of the hat) are also recommended for sun protection.  - Call for new or changing lesions. - Recommended FBSE within next year       Chronic and persistent condition with duration or expected duration over one year. Condition is stable and at treatment goal.   Procedures, orders, diagnosis for this visit:  ACTINIC KERATOSIS (6) Scalp (6) Actinic keratoses are precancerous spots that appear secondary to cumulative UV radiation exposure/sun exposure over time. They are chronic with expected duration over 1 year. A portion of actinic keratoses will progress to squamous cell carcinoma of the skin. It is not possible to reliably predict which spots will progress to skin cancer and so treatment is recommended to prevent development of skin cancer.  Recommend daily broad spectrum sunscreen SPF 30+ to sun-exposed areas, reapply every 2 hours as needed.  Recommend staying in the shade or wearing long sleeves, sun glasses (UVA+UVB protection) and wide brim hats (4-inch brim around the entire circumference of the hat). Call for new or changing lesions. Destruction of lesion - Scalp (6) Complexity: simple   Destruction method: cryotherapy  Informed consent: discussed and consent obtained   Timeout:  patient name, date of birth, surgical site, and procedure  verified Lesion destroyed using liquid nitrogen: Yes   Region frozen until ice ball extended beyond lesion: Yes   Cryo cycles: 1 or 2. Outcome: patient tolerated procedure well with no complications   Post-procedure details: wound care instructions given     Actinic keratosis -     Destruction of lesion    Return to clinic: Return 6-12 months, for TBSE.  I, Emerick Ege, CMA am acting as scribe for Lauraine JAYSON Kanaris, MD.   Documentation: I have reviewed the above documentation for accuracy and completeness, and I agree with the above.  Lauraine JAYSON Kanaris, MD

## 2023-11-24 ENCOUNTER — Ambulatory Visit

## 2023-11-29 ENCOUNTER — Ambulatory Visit: Admitting: Dermatology

## 2023-12-01 ENCOUNTER — Ambulatory Visit

## 2023-12-01 DIAGNOSIS — R0602 Shortness of breath: Secondary | ICD-10-CM

## 2023-12-01 DIAGNOSIS — J45909 Unspecified asthma, uncomplicated: Secondary | ICD-10-CM

## 2023-12-01 LAB — PULMONARY FUNCTION TEST
DL/VA % pred: 102 %
DL/VA: 3.86 ml/min/mmHg/L
DLCO unc % pred: 79 %
DLCO unc: 19.43 ml/min/mmHg
FEF 25-75 Post: 2.41 L/s
FEF 25-75 Pre: 2.08 L/s
FEF2575-%Change-Post: 15 %
FEF2575-%Pred-Post: 125 %
FEF2575-%Pred-Pre: 108 %
FEV1-%Change-Post: 3 %
FEV1-%Pred-Post: 90 %
FEV1-%Pred-Pre: 87 %
FEV1-Post: 2.59 L
FEV1-Pre: 2.5 L
FEV1FVC-%Change-Post: 0 %
FEV1FVC-%Pred-Pre: 109 %
FEV6-%Change-Post: 2 %
FEV6-%Pred-Post: 86 %
FEV6-%Pred-Pre: 84 %
FEV6-Post: 3.29 L
FEV6-Pre: 3.2 L
FEV6FVC-%Change-Post: 0 %
FEV6FVC-%Pred-Post: 106 %
FEV6FVC-%Pred-Pre: 106 %
FVC-%Change-Post: 2 %
FVC-%Pred-Post: 81 %
FVC-%Pred-Pre: 79 %
FVC-Post: 3.31 L
FVC-Pre: 3.22 L
Post FEV1/FVC ratio: 78 %
Post FEV6/FVC ratio: 100 %
Pre FEV1/FVC ratio: 78 %
Pre FEV6/FVC Ratio: 99 %
RV % pred: 93 %
RV: 2.59 L
TLC % pred: 81 %
TLC: 5.9 L

## 2023-12-01 NOTE — Patient Instructions (Signed)
 Full PFT completed today ? ?

## 2023-12-01 NOTE — Progress Notes (Signed)
 Full PFT completed today ? ?

## 2023-12-02 ENCOUNTER — Ambulatory Visit: Admitting: Pulmonary Disease

## 2023-12-03 ENCOUNTER — Ambulatory Visit: Admitting: Pulmonary Disease

## 2023-12-03 ENCOUNTER — Encounter: Payer: Self-pay | Admitting: Pulmonary Disease

## 2023-12-03 VITALS — BP 106/66 | HR 84 | Temp 97.6°F | Ht 71.0 in | Wt 190.0 lb

## 2023-12-03 DIAGNOSIS — J4540 Moderate persistent asthma, uncomplicated: Secondary | ICD-10-CM | POA: Diagnosis not present

## 2023-12-03 DIAGNOSIS — L209 Atopic dermatitis, unspecified: Secondary | ICD-10-CM | POA: Diagnosis not present

## 2023-12-03 DIAGNOSIS — J3089 Other allergic rhinitis: Secondary | ICD-10-CM

## 2023-12-03 DIAGNOSIS — R0602 Shortness of breath: Secondary | ICD-10-CM

## 2023-12-03 LAB — NITRIC OXIDE: Nitric Oxide: 22

## 2023-12-03 MED ORDER — MOMETASONE FUROATE 50 MCG/ACT NA SUSP: 1.0000 | Freq: Two times a day (BID) | NASAL | 2 refills | Status: AC

## 2023-12-03 MED ORDER — TRELEGY ELLIPTA 100-62.5-25 MCG/ACT IN AEPB: 1.0000 | INHALATION_SPRAY | Freq: Every day | RESPIRATORY_TRACT | 11 refills | Status: DC

## 2023-12-03 NOTE — Progress Notes (Signed)
 Subjective:    Patient ID: Edward Rodriguez, male    DOB: 02-06-1941, 83 y.o.   MRN: [id number]  Patient Care Team: Wellington Curtis LABOR, FNP as PCP - General (Family Medicine) Darliss Rogue, MD as PCP - Cardiology (Cardiology) [facility], [facility])  Chief Complaint  Patient presents with   Shortness of Breath    Shortness of breath on exertion. Occasional cough and wheezing on exertion.     BACKGROUND/INTERVAL:Patient presents for follow-up of shortness of breath on exertion, he was initially evaluated on 15 September 2023.  At that time working diagnosis was that of asthma given the seasonal variation of the symptoms and also concomitant issues with atopic dermatitis for which he is on Dupixent .  HPI Discussed the use of AI scribe software for clinical note transcription with the patient, who gave verbal consent to proceed.  History of Present Illness   Edward Rodriguez is an 83 year old male with asthma who presents with ongoing respiratory symptoms and nasal congestion.  He experiences persistent asthma symptoms, with some improvement in exercise tolerance as he does not get as tired when walking. However, fatigue remains an issue. He uses albuterol  as needed and receives Dupixent  injections for atopic dermatitis. The powder from his maintenance inhaler (Advair) causes discomfort, prompting consideration of switching to a different inhaler.  He describes a change in taste and a slick feeling at the top of his mouth, raising concerns about possible oral thrush, although he rinses his mouth regularly. Chronic nasal congestion is present, with an inability to breathe through the nose, worsening with activity and causing mouth breathing and dryness. This issue has been long-standing and is very irritating, affecting his breathing.  He has a history of atopic dermatitis, which is relevant to his current treatment with Dupixent , as it may also help with his respiratory symptoms.     PFTs  were performed on 01 December 2023: FEV1 2.50 L or 87% predicted, FVC 3.22 L or 79% predicted, FEV1/FVC 78%, no bronchodilator response, lung volumes at the low end of normal.  Diffusion capacity mildly reduced but corrects to normal by alveolar volume.  There is a slight curvature on the expiratory limb of the flow-volume loop consistent with obstruction.  This is consistent with minimal obstruction and minimal diffusion defect which corrects to normal by alveolar volume.  Review of Systems A 10 point review of systems was performed and it is as noted above otherwise negative.   Patient Active Problem List   Diagnosis Date Noted   Acute bacterial bronchitis 11/25/2022   Superficial thrombophlebitis 06/14/2022   Nausea 05/28/2022   DOE (dyspnea on exertion) 05/28/2022   Other nonthrombocytopenic purpura 05/21/2022   History of dizziness 05/15/2022   Raynaud's phenomenon without gangrene 03/31/2022   Poor balance 03/31/2022   Complex renal cyst 08/20/2021   Chronic kidney disease (CKD) stage G3a/A2 07/11/2021   Elevated serum creatinine 07/11/2021   Annual physical exam 07/10/2021   Atherosclerotic PVD with intermittent claudication 07/10/2021   Senile purpura 07/10/2021   Anxiety and depression 07/10/2021   Fatigue 07/10/2021   Fall 07/10/2021   Elevated LDL cholesterol level 07/10/2021   Gastroesophageal reflux disease 04/07/2021   Bilateral primary osteoarthritis of first carpometacarpal joints 08/31/2016   Leg varices 09/06/2009   Allergic rhinitis 06/17/2007    Social History   Tobacco Use   Smoking status: Former    Current packs/day: 0.00    Average packs/day: 0.5 packs/day for 11.4 years (5.7 ttl pk-yrs)  Types: Cigarettes    Start date: 02/09/1957    Quit date: 06/18/1968    Years since quitting: 55.4   Smokeless tobacco: Never  Substance Use Topics   Alcohol use: Yes    Alcohol/week: 4.0 standard drinks of alcohol    Types: 4 Cans of beer per week    Comment:  occasionally    Allergies  Allergen Reactions   Sulfa Antibiotics    Elemental Sulfur Rash    Current Meds  Medication Sig   albuterol  (VENTOLIN  HFA) 108 (90 Base) MCG/ACT inhaler Inhale 2 puffs into the lungs every 6 (six) hours as needed.   aspirin EC 81 MG tablet Take 81 mg by mouth daily. Swallow whole.   clobetasol  (TEMOVATE ) 0.05 % external solution MIX CLOBETASOL  SOLUTION IN A JAR OF CERAVE CREAM. APPLY TO AFFECTED AREAS OF RASH TWICE DAILY UNTIL IMPROVED. AVOID FACE, GROIN, AND AXILLA   clobetasol  ointment (TEMOVATE ) 0.05 % Apply 1 gram topically to affected area of skin twice daily. Stop once resolved and restart as needed for flares. Avoid use on face, armpits, groin unless otherwise indicated.   dupilumab  (DUPIXENT ) 300 MG/2ML prefilled syringe Inject 300 mg into the skin every 14 (fourteen) days. For maintenance.   Fluticasone -Umeclidin-Vilant (TRELEGY ELLIPTA) 100-62.5-25 MCG/ACT AEPB Inhale 1 puff into the lungs daily.   meloxicam  (MOBIC ) 15 MG tablet Take 15 mg by mouth daily.   mometasone (NASONEX) 50 MCG/ACT nasal spray Place 1 spray into the nose 2 (two) times daily.   omeprazole  (PRILOSEC) 20 MG capsule Take 2 capsules (40 mg total) by mouth daily.   sildenafil  (REVATIO ) 20 MG tablet TAKE 2 TO 5 TABLETS by mouth once daily as needed to obtain/maintain erection.   triamcinolone  ointment (KENALOG ) 0.1 % Apply 7 grams twice daily to affected areas of skin. Stop once resolved and restart as needed for flares. Avoid use on face, armpits, groin unless otherwise indicated.   venlafaxine  XR (EFFEXOR -XR) 150 MG 24 hr capsule TAKE 2 CAPSULES BY MOUTH ONCE DAILY WITH BREAKFAST (NEEDS  APPOINTMENT  FOR  FURTHER  REFILLS)    Immunization History  Administered Date(s) Administered    sv, Bivalent, Protein Subunit Rsvpref,pf (Abrysvo) 11/08/2021   Fluad Quad(high Dose 65+) 11/08/2021   INFLUENZA, HIGH DOSE SEASONAL PF 11/04/2013, 11/11/2015, 11/06/2016, 10/15/2018, 10/15/2019,  10/02/2022   Influenza Split 12/01/2010   Influenza,inj,Quad PF,6+ Mos 10/22/2012   Influenza-Unspecified 10/20/2017   PFIZER(Purple Top)SARS-COV-2 Vaccination 03/02/2019, 03/23/2019, 01/22/2020   Pfizer Covid-19 Vaccine Bivalent Booster 56yrs & up 11/08/2020   Pneumococcal Conjugate-13 07/13/2013   Pneumococcal Polysaccharide-23 12/01/2010   Td 08/31/2017   Zoster Recombinant(Shingrix ) 08/12/2023   Zoster, Live 07/13/2013        Objective:     BP 106/66   Pulse 84   Temp 97.6 F (36.4 C) (Temporal)   Ht 5' 11 (1.803 m)   Wt 190 lb (86.2 kg)   SpO2 95%   BMI 26.50 kg/m   SpO2: 95 %  GENERAL: Well-developed, well-nourished gentleman, no acute distress.  Fully ambulatory, no conversational dyspnea. HEAD: Normocephalic, atraumatic.  EYES: Pupils equal, round, reactive to light.  No scleral icterus.  NOSE: Significant turbinate edema. MOUTH: Dentition intact, oral mucosa moist.  No thrush. NECK: Supple. No thyromegaly. Trachea midline. No JVD.  No adenopathy. PULMONARY: Good air entry bilaterally.  No adventitious sounds. CARDIOVASCULAR: S1 and S2. Regular rate and rhythm.  No rubs, murmurs or gallops heard. ABDOMEN: Benign. MUSCULOSKELETAL: No joint deformity, no clubbing, no edema.  NEUROLOGIC: No overt  focal deficit, no gait disturbance, speech is fluent. SKIN: Intact,warm,dry. PSYCH: Mood and behavior normal  Lab Results  Component Value Date   NITRICOXIDE 22 12/03/2023  *This result suggests low (<25) Type II (T2) airway inflammation indicating a low likelihood of active T2-driven airway inflammation.  In a patient with active T2 driven asthma management it suggests good control.     Assessment & Plan:     ICD-10-CM   1. Moderate persistent asthma without complication  J45.40     2. Shortness of breath  R06.02 Nitric oxide     3. Atopic dermatitis, unspecified type  L20.9    On Dupixent  per dermatology    4. Perennial allergic rhinitis  J30.89        Orders Placed This Encounter  Procedures   Nitric oxide     Meds ordered this encounter  Medications   mometasone (NASONEX) 50 MCG/ACT nasal spray    Sig: Place 1 spray into the nose 2 (two) times daily.    Dispense:  17 mL    Refill:  2   Fluticasone -Umeclidin-Vilant (TRELEGY ELLIPTA) 100-62.5-25 MCG/ACT AEPB    Sig: Inhale 1 puff into the lungs daily.    Dispense:  28 each    Refill:  11   Discussion:    Asthma Asthma is present but not severe, as indicated by the pulmonary function test and nitric oxide  test. He reports improvement in exercise tolerance but still experiences fatigue. The current inhaler may be causing oral discomfort, and there is a need to switch to a more suitable maintenance inhaler. Dupixent  is being used to reduce medication needs but is not sufficient alone for asthma control. - Switch maintenance inhaler to Trelegy, one puff once daily, to be taken at the same time each day. - Advise rinsing mouth with water and baking soda after inhaler use to prevent oral discomfort. - Encourage staying active to improve symptoms. - Check airway inflammation levels: 22 ppb today, consistent with good control.  Chronic/perennial rhinitis Chronic rhinitis is characterized by nasal congestion, particularly during physical activity, leading to mouth breathing and dryness. The condition is likely due to nasal membrane swelling, possibly related to nonallergic rhinitis, but may have an allergic component given his atopic dermatitis. Dupixent  may help improve symptoms over time. - Prescribe anti-inflammatory nasal spray (Nasonex), one spray in each nostril twice daily, avoiding use right before bedtime. - Recommend using Breathe Right strips during physical activity and at bedtime to reduce nasal congestion.     Will see the patient in follow-up in 4 months time call sooner should any problems arise.  Advised if symptoms do not improve or worsen, to please contact office for  sooner follow up or seek emergency care.    I spent 33 minutes of dedicated to the care of this patient on the date of this encounter to include pre-visit review of records, face-to-face time with the patient discussing conditions above, post visit ordering of testing, clinical documentation with the electronic health record, making appropriate referrals as documented, and communicating necessary findings to members of the patients care team.     C. Leita Sanders, MD Advanced Bronchoscopy PCCM [facility]    *This note was generated using voice recognition software/Dragon and/or AI transcription program.  Despite best efforts to proofread, errors can occur which can change the meaning. Any transcriptional errors that result from this process are unintentional and may not be fully corrected at the time of dictation.

## 2023-12-03 NOTE — Patient Instructions (Signed)
 VISIT SUMMARY:  During your visit, we discussed your ongoing respiratory symptoms and nasal congestion. We reviewed your asthma management and made some adjustments to your treatment plan to help improve your symptoms and overall quality of life.  YOUR PLAN:  -ASTHMA: Asthma is a condition where your airways become inflamed and narrow, making it hard to breathe. We will switch your maintenance inhaler to Trelegy, which you should use one puff once daily at the same time each day. To prevent oral discomfort, rinse your mouth with water and baking soda after using the inhaler. Staying active can also help improve your symptoms. We will check your airway inflammation levels to monitor your condition.  -CHRONIC RHINITIS: Chronic rhinitis is long-term inflammation of the nasal passages, causing congestion and difficulty breathing through your nose. We will prescribe an anti-inflammatory nasal spray, Nasonex, which you should use one spray in each nostril twice daily, but avoid using it right before bedtime. Additionally, using Breathe Right strips during physical activity and at bedtime can help reduce nasal congestion.  INSTRUCTIONS:  Please follow up as needed to monitor your symptoms and adjust your treatment plan if necessary. If you experience any new or worsening symptoms, contact our office.

## 2023-12-15 ENCOUNTER — Other Ambulatory Visit: Payer: Self-pay | Admitting: Family Medicine

## 2023-12-15 DIAGNOSIS — F419 Anxiety disorder, unspecified: Secondary | ICD-10-CM

## 2023-12-15 NOTE — Telephone Encounter (Unsigned)
 Copied from CRM [id number]. Topic: Clinical - Prescription Issue >> Dec 15, 2023 12:03 PM Zebedee SAUNDERS wrote: Reason for CRM: Pt stated pharmacy did does not have refill request for venlafaxine  XR (EFFEXOR -XR) 150 MG 24 hr capsule. Pt is schedule for appt tomorrow 12/16/2023 with Curtis Boom NP.

## 2023-12-16 ENCOUNTER — Encounter: Payer: Self-pay | Admitting: Family Medicine

## 2023-12-16 ENCOUNTER — Ambulatory Visit (INDEPENDENT_AMBULATORY_CARE_PROVIDER_SITE_OTHER): Admitting: Family Medicine

## 2023-12-16 VITALS — BP 131/73 | HR 81 | Resp 16 | Ht 71.0 in | Wt 193.2 lb

## 2023-12-16 DIAGNOSIS — H6121 Impacted cerumen, right ear: Secondary | ICD-10-CM | POA: Diagnosis not present

## 2023-12-16 DIAGNOSIS — F419 Anxiety disorder, unspecified: Secondary | ICD-10-CM

## 2023-12-16 DIAGNOSIS — J4540 Moderate persistent asthma, uncomplicated: Secondary | ICD-10-CM

## 2023-12-16 DIAGNOSIS — H9201 Otalgia, right ear: Secondary | ICD-10-CM | POA: Diagnosis not present

## 2023-12-16 MED ORDER — VENLAFAXINE HCL ER 150 MG PO CP24: ORAL_CAPSULE | ORAL | 3 refills | Status: AC

## 2023-12-16 NOTE — Progress Notes (Signed)
 Established Patient Office Visit  Introduced to nurse practitioner role and practice setting.  All questions answered.  Discussed provider/patient relationship and expectations.   Subjective   Patient ID: Edward Rodriguez, male    DOB: Mar 24, 1940  Age: 83 y.o. MRN: [id number]  Chief Complaint  Patient presents with   Medical Management of Chronic Issues    Anxiety and Depression    Discussed the use of AI scribe software for clinical note transcription with the patient, who gave verbal consent to proceed.  History of Present Illness Edward Rodriguez is an 83 year old male with generalized anxiety disorder who presents for medication management and ear pain.  He is currently taking venlafaxine  at 300 mg daily for anxiety and wants to reduce the dosage to 150 mg daily. He feels the medication has been effective but is concerned about adjusting to a lower dose.   He describes intermittent right ear pain that is severe enough to prevent him from touching his ear. The pain is relieved by applying a heating pad for 5-10 minutes. This issue has been ongoing for several years, occurring without a predictable pattern, and resolves quickly. He has not been using any specific allergy medications recently.   He also notes that using inhalers for his asthma has improved his breathing but feels it has affected his voice, making it difficult to reach lower notes when singing. He has been taking walks more comfortably in the cooler weather compared to the summer months when he experienced more difficulty breathing.         12/16/2023    1:11 PM 11/15/2023    9:53 AM 09/16/2023    8:16 AM  Depression screen PHQ 2/9  Decreased Interest 0 0 1  Down, Depressed, Hopeless 1 1 1   PHQ - 2 Score 1 1 2   Altered sleeping 0 1 1  Tired, decreased energy 2 1 1   Change in appetite 0 0 1  Feeling bad or failure about yourself  1 1 1   Trouble concentrating 2 1 1   Moving slowly or fidgety/restless 0 0 0  Suicidal thoughts  0 0 0  PHQ-9 Score 6 5  7    Difficult doing work/chores Somewhat difficult Not difficult at all Not difficult at all     Data saved with a previous flowsheet row definition       12/16/2023    1:12 PM 11/15/2023    9:54 AM 09/16/2023    8:16 AM 08/12/2023    9:51 AM  GAD 7 : Generalized Anxiety Score  Nervous, Anxious, on Edge 1 1 1 1   Control/stop worrying 1 1 1 1   Worry too much - different things 1 1 1 1   Trouble relaxing 1 1 1 1   Restless 1 1 0 0  Easily annoyed or irritable 1 0 0 0  Afraid - awful might happen 1 0 0 0  Total GAD 7 Score 7 5 4 4   Anxiety Difficulty Somewhat difficult Not difficult at all Not difficult at all Not difficult at all     ROS  Negative unless indicated in HPI   Objective:     BP 131/73 (BP Location: Left Arm, Patient Position: Sitting, Cuff Size: Normal)   Pulse 81   Resp 16   Ht 5' 11 (1.803 m)   Wt 193 lb 3.2 oz (87.6 kg)   SpO2 94%   BMI 26.95 kg/m    Physical Exam Constitutional:      General: He is not in  acute distress.    Appearance: Normal appearance. He is not ill-appearing, toxic-appearing or diaphoretic.  HENT:     Head: Normocephalic.     Right Ear: Hearing and external ear normal. There is impacted cerumen.     Left Ear: Hearing, tympanic membrane, ear canal and external ear normal.     Ears:     Comments: Deep, dry excessive cerumen R ear canal    Nose: Nose normal.     Mouth/Throat:     Mouth: Mucous membranes are moist.  Eyes:     Extraocular Movements: Extraocular movements intact.     Conjunctiva/sclera: Conjunctivae normal.     Pupils: Pupils are equal, round, and reactive to light.  Cardiovascular:     Rate and Rhythm: Normal rate and regular rhythm.     Heart sounds: No murmur heard.    No friction rub. No gallop.  Pulmonary:     Effort: Pulmonary effort is normal. No respiratory distress.     Breath sounds: Normal breath sounds. No stridor. No wheezing, rhonchi or rales.  Chest:     Chest wall: No  tenderness.  Musculoskeletal:     Right lower leg: No edema.     Left lower leg: No edema.  Skin:    General: Skin is warm and dry.     Capillary Refill: Capillary refill takes less than 2 seconds.  Neurological:     General: No focal deficit present.     Mental Status: He is alert and oriented to person, place, and time. Mental status is at baseline.     Cranial Nerves: No cranial nerve deficit.     Sensory: No sensory deficit.     Motor: No weakness.     Coordination: Coordination normal.     Gait: Gait normal.  Psychiatric:        Mood and Affect: Mood normal.        Behavior: Behavior normal.        Thought Content: Thought content normal.        Judgment: Judgment normal.      No results found for any visits on 12/16/23.    The ASCVD Risk score (Arnett DK, et al., 2019) failed to calculate for the following reasons:   The 2019 ASCVD risk score is only valid for ages 70 to 13    Assessment & Plan:  Anxiety -     Venlafaxine  HCl ER; Take two tablets once daily with breakfast.  Dispense: 180 capsule; Refill: 3  Excessive cerumen in ear canal, right  Moderate persistent late onset asthma without complication  Right ear pain     Assessment and Plan Assessment & Plan Generalized anxiety disorder Managed with venlafaxine . He has been on 300 mg daily and is interested in reducing the dose to 150 mg daily. Discussed tapering strategy to avoid withdrawal symptoms. - Taper venlafaxine  to 150 mg every other day for two weeks with 300mg  on the other days, then reduce to 150 mg daily if tolerated. - Monitor mood and anxiety symptoms during tapering. - If anxiety symptoms worsen, revert to previous 300 mg daily dose.  Right ear pain and cerumen impaction Intermittent right ear pain with cerumen impaction. Pain resolves with heat application. Possible underlying seasonal allergies contributing to sinus inflammation and ear pain. Examination revealed dry wax deep in the ear  canal - L ear TM unremarkable - does not appear infectious - no enlarged lymph nodes - Continue Nasonex for eustachian tube flow - Use  Debrox ear wax softener before showering to loosen ear wax 2-3x per week. - apply aquaphor to out ear for dry skin.  - Avoid inserting objects into the ear canal. - Consider over-the-counter antihistamines like Zyrtec, Claritin, or Allegra for potential seasonal allergies. - If ear pain worsens or wax cannot be removed, will consider referral to ENT specialist.  Late Onset Asthma. Moderate persistent - managed by Pulmonary - continue prn albuterol   - continue Trelegy Ellipta daily - ensure rising mouth after inhaler use - try drinking hot tea with honey or gargle with warm salt water for hoarseness   Return in about 2 months (around 02/15/2024).   I, Curtis DELENA Boom, FNP, have reviewed all documentation for this visit. The documentation on 12/19/23 for the exam, diagnosis, procedures, and orders are all accurate and complete.   Curtis DELENA Boom, FNP

## 2023-12-16 NOTE — Patient Instructions (Signed)
 DeBrox for loosening ear wax May Apply aquaphor to outside of ear May try over the counter allergy medication - allegra, zyrtec, claritin

## 2023-12-19 ENCOUNTER — Encounter: Payer: Self-pay | Admitting: Family Medicine

## 2023-12-20 ENCOUNTER — Telehealth: Payer: Self-pay | Admitting: Pulmonary Disease

## 2023-12-20 NOTE — Telephone Encounter (Signed)
NA. LMTCB 

## 2023-12-20 NOTE — Telephone Encounter (Signed)
 Patient reports using baking soda, to rinse. Please advise.

## 2023-12-20 NOTE — Telephone Encounter (Signed)
 Lmtcb

## 2023-12-20 NOTE — Telephone Encounter (Signed)
 Pt is complaining of loss of voice. Asking if needs to change dose or inhaler. States no congestion, no sputum, ocassionally coughing

## 2023-12-20 NOTE — Telephone Encounter (Signed)
 Is he rinsing well after inhaler use?  Is he using a small amount of baking soda in the rinse water?

## 2023-12-20 NOTE — Telephone Encounter (Signed)
 If the symptoms are short-lived after the dose of Trelegy there is really not much to do.  If they improve after use then would recommend continuing the medication.  However if he feels that this is too bothersome and lasts too long after the dose of the inhaler we can certainly try other inhalers but for asthma control he needs inhaled corticosteroids and they are going to all have a tendency to cause hoarseness.  We can always try to switch him to Airsupra as needed and see if this is better.  He is already on Dupixent  for atopic dermatitis which should help with his asthma as well.

## 2023-12-21 ENCOUNTER — Ambulatory Visit: Payer: Self-pay | Admitting: Pulmonary Disease

## 2023-12-21 NOTE — Telephone Encounter (Signed)
 Noted. Nothing further needed.

## 2023-12-21 NOTE — Telephone Encounter (Signed)
 FYI Only or Action Required?: FYI only for provider: relayed provider information from encounter.  Patient is followed in Pulmonology for asthma, last seen on 12/03/2023 by Tamea Dedra CROME, MD.  Called Nurse Triage reporting Advice Only.  Triage Disposition: Information or Advice Only Call  Patient/caregiver understands and will follow disposition?: Yes  Copied from CRM [id number]. Topic: Clinical - Medical Advice >> Dec 21, 2023  9:23 AM Edward Rodriguez wrote: Reason for CRM: pt has message in chart to c/b Reason for Disposition  Health information question, no triage required and triager able to answer question  Answer Assessment - Initial Assessment Questions 1. REASON FOR CALL: What is the main reason for your call? or How can I best help you?     Patient returning a call. Information from Dr.Gonzalez note relayed to the patient. Patient states the hoarseness has gotten better today. Patient states he is going to continue with the Trelegy and see how his hoarseness does. Will call back if his symptoms don't continue to improve.  2. SYMPTOMS : Do you have any symptoms?      Hoarseness which is better today per patient report 3. OTHER QUESTIONS: Do you have any other questions?     No other questions.  Protocols used: Information Only Call - No Triage-A-AH

## 2023-12-21 NOTE — Telephone Encounter (Signed)
 Per nurse triage message from today, Patient returning a call. Information from Dr.Gonzalez note relayed to the patient. Patient states the hoarseness has gotten better today. Patient states he is going to continue with the Trelegy and see how his hoarseness does. Will call back if his symptoms don't continue to improve.   Nothing further needed.

## 2023-12-22 DIAGNOSIS — K219 Gastro-esophageal reflux disease without esophagitis: Secondary | ICD-10-CM | POA: Diagnosis not present

## 2023-12-22 DIAGNOSIS — F419 Anxiety disorder, unspecified: Secondary | ICD-10-CM | POA: Diagnosis not present

## 2023-12-22 DIAGNOSIS — F32A Depression, unspecified: Secondary | ICD-10-CM | POA: Diagnosis not present

## 2023-12-22 DIAGNOSIS — N1831 Chronic kidney disease, stage 3a: Secondary | ICD-10-CM | POA: Diagnosis not present

## 2023-12-22 DIAGNOSIS — N281 Cyst of kidney, acquired: Secondary | ICD-10-CM | POA: Diagnosis not present

## 2023-12-22 DIAGNOSIS — R809 Proteinuria, unspecified: Secondary | ICD-10-CM | POA: Diagnosis not present

## 2023-12-29 DIAGNOSIS — Z961 Presence of intraocular lens: Secondary | ICD-10-CM | POA: Diagnosis not present

## 2023-12-29 DIAGNOSIS — H04123 Dry eye syndrome of bilateral lacrimal glands: Secondary | ICD-10-CM | POA: Diagnosis not present

## 2024-01-11 ENCOUNTER — Other Ambulatory Visit: Payer: Self-pay | Admitting: Dermatology

## 2024-01-11 ENCOUNTER — Telehealth: Payer: Self-pay | Admitting: Podiatry

## 2024-01-11 DIAGNOSIS — N1831 Chronic kidney disease, stage 3a: Secondary | ICD-10-CM | POA: Diagnosis not present

## 2024-01-11 DIAGNOSIS — F419 Anxiety disorder, unspecified: Secondary | ICD-10-CM | POA: Diagnosis not present

## 2024-01-11 DIAGNOSIS — K219 Gastro-esophageal reflux disease without esophagitis: Secondary | ICD-10-CM | POA: Diagnosis not present

## 2024-01-11 DIAGNOSIS — L2081 Atopic neurodermatitis: Secondary | ICD-10-CM

## 2024-01-11 DIAGNOSIS — F32A Depression, unspecified: Secondary | ICD-10-CM | POA: Diagnosis not present

## 2024-01-11 DIAGNOSIS — R809 Proteinuria, unspecified: Secondary | ICD-10-CM | POA: Diagnosis not present

## 2024-01-11 DIAGNOSIS — N281 Cyst of kidney, acquired: Secondary | ICD-10-CM | POA: Diagnosis not present

## 2024-01-11 NOTE — Telephone Encounter (Signed)
 Called and left message for patient to discuss surgery as patient had left message in regards to doing so.

## 2024-01-13 ENCOUNTER — Telehealth: Payer: Self-pay | Admitting: Family Medicine

## 2024-01-13 DIAGNOSIS — N529 Male erectile dysfunction, unspecified: Secondary | ICD-10-CM

## 2024-01-13 NOTE — Telephone Encounter (Signed)
 [facility] [address] is requesting refills on Sildenafil  20 mg. #20

## 2024-01-14 MED ORDER — SILDENAFIL CITRATE 20 MG PO TABS: ORAL_TABLET | ORAL | 1 refills | Status: AC

## 2024-01-14 NOTE — Addendum Note (Signed)
 Addended by: SIMMONS-ROBINSON, Abby Tucholski L on: 01/14/2024 03:41 PM   Modules accepted: Orders

## 2024-01-17 DIAGNOSIS — M180 Bilateral primary osteoarthritis of first carpometacarpal joints: Secondary | ICD-10-CM | POA: Diagnosis not present

## 2024-01-18 ENCOUNTER — Ambulatory Visit: Admitting: Podiatry

## 2024-01-20 ENCOUNTER — Ambulatory Visit: Admitting: Podiatry

## 2024-01-20 DIAGNOSIS — M216X2 Other acquired deformities of left foot: Secondary | ICD-10-CM | POA: Diagnosis not present

## 2024-01-20 DIAGNOSIS — Z01818 Encounter for other preprocedural examination: Secondary | ICD-10-CM

## 2024-01-20 NOTE — Progress Notes (Signed)
 Subjective:  Patient ID: Edward Rodriguez, male    DOB: Feb 25, 1940,  MRN: [id number]  Chief Complaint  Patient presents with   Plantar flexed metatarsal, left    Plantar flexed metatarsal, left    83 y.o. male presents with the above complaint.  Patient presents with complaint of left plantarflexed fifth metatarsal painful to touch is progressive gotten worse worse with ambulation worse with pressure patient would like to discuss treatment options for it.  He denies seeing anyone else prior to seeing me pain scale 7 out of 10 dull achy in nature he has tried debridement and offloading nothing has helped.  He would like to discuss surgical options at this time.  He denies being diabetic.   Review of Systems: Negative except as noted in the HPI. Denies N/V/F/Ch.  Past Medical History:  Diagnosis Date   Actinic keratosis 07/26/2007   R dorsum index finger - bx proven    Actinic keratosis 01/25/2008   L inf helix - bx proven    Actinic keratosis 05/16/2009   L lat canthus - bx proven    Anxiety    GERD (gastroesophageal reflux disease)    Vertigo 06/2014   had an episode in 2013.    Current Outpatient Medications:    albuterol  (VENTOLIN  HFA) 108 (90 Base) MCG/ACT inhaler, Inhale 2 puffs into the lungs every 6 (six) hours as needed., Disp: 8 g, Rfl: 2   aspirin EC 81 MG tablet, Take 81 mg by mouth daily. Swallow whole., Disp: , Rfl:    clobetasol  (TEMOVATE ) 0.05 % external solution, MIX CLOBETASOL  SOLUTION IN A JAR OF CERAVE CREAM. APPLY TO AFFECTED AREAS OF RASH TWICE DAILY UNTIL IMPROVED. AVOID FACE, GROIN, AND AXILLA, Disp: 50 mL, Rfl: 0   clobetasol  ointment (TEMOVATE ) 0.05 %, Apply 1 gram topically to affected area of skin twice daily. Stop once resolved and restart as needed for flares. Avoid use on face, armpits, groin unless otherwise indicated., Disp: 60 g, Rfl: 5   DUPIXENT  300 MG/2ML prefilled syringe, INJECT 300MG  (1 INJECTION) SUBCUTANEOUSLY EVERY 14 DAYS, Disp: 4 mL, Rfl: 4    Fluticasone -Umeclidin-Vilant (TRELEGY ELLIPTA ) 100-62.5-25 MCG/ACT AEPB, Inhale 1 puff into the lungs daily., Disp: 28 each, Rfl: 11   meloxicam  (MOBIC ) 15 MG tablet, Take 15 mg by mouth daily., Disp: , Rfl:    mometasone  (NASONEX ) 50 MCG/ACT nasal spray, Place 1 spray into the nose 2 (two) times daily., Disp: 17 mL, Rfl: 2   omeprazole  (PRILOSEC) 20 MG capsule, Take 2 capsules (40 mg total) by mouth daily., Disp: 60 capsule, Rfl: 11   sildenafil  (REVATIO ) 20 MG tablet, TAKE 2 TO 5 TABLETS by mouth once daily as needed to obtain/maintain erection., Disp: 20 tablet, Rfl: 1   triamcinolone  ointment (KENALOG ) 0.1 %, Apply 7 grams twice daily to affected areas of skin. Stop once resolved and restart as needed for flares. Avoid use on face, armpits, groin unless otherwise indicated., Disp: 454 g, Rfl: 2   venlafaxine  XR (EFFEXOR -XR) 150 MG 24 hr capsule, Take two tablets once daily with breakfast., Disp: 180 capsule, Rfl: 3  Social History   Tobacco Use  Smoking Status Former   Current packs/day: 0.00   Average packs/day: 0.5 packs/day for 11.4 years (5.7 ttl pk-yrs)   Types: Cigarettes   Start date: 02/09/1957   Quit date: 06/18/1968   Years since quitting: 55.6  Smokeless Tobacco Never    Allergies  Allergen Reactions   Sulfa Antibiotics    Elemental Sulfur Rash  Objective:  There were no vitals filed for this visit. There is no height or weight on file to calculate BMI. Constitutional Well developed. Well nourished.  Vascular Dorsalis pedis pulses palpable bilaterally. Posterior tibial pulses palpable bilaterally. Capillary refill normal to all digits.  No cyanosis or clubbing noted. Pedal hair growth normal.  Neurologic Normal speech. Oriented to person, place, and time. Epicritic sensation to light touch grossly present bilaterally.  Dermatologic Nails well groomed and normal in appearance. No open wounds. No skin lesions.  Orthopedic: Left plantarflexed fifth metatarsal noted  pain on palpation submetatarsal 5.  Central nucleated core with hyperkeratotic lesion noted   Radiographs: 3 views of skeletally mature adult left foot: Plantarflexed fifth metatarsal noted mild tailor's bunion deformity noted.  Negative negative metatarsal height noted Assessment:   No diagnosis found.   Plan:  Patient was evaluated and treated and all questions answered.  Left plantarflexed fifth metatarsal -All questions and concerns were discussed with the patient in extensive detail -Given the amount of pain that is having a benefit from surgical floating osteotomy of the fifth metatarsal to take the pressure off the submetatarsal 5.  I discussed my procedure in extensive detail I discussed my preoperative intra postop plan with the patient extensive detail he states understanding like to proceed with surgery -Informed surgical risk consent was reviewed and read aloud to the patient.  I reviewed the films.  I have discussed my findings with the patient in great detail.  I have discussed all risks including but not limited to infection, stiffness, scarring, limp, disability, deformity, damage to blood vessels and nerves, numbness, poor healing, need for braces, arthritis, chronic pain, amputation, death.  All benefits and realistic expectations discussed in great detail.  I have made no promises as to the outcome.  I have provided realistic expectations.  I have offered the patient a 2nd opinion, which they have declined and assured me they preferred to proceed despite the risks -Lesion was debrided down to give him some relief.   No follow-ups on file.

## 2024-02-15 ENCOUNTER — Ambulatory Visit: Admitting: Family Medicine

## 2024-03-14 ENCOUNTER — Ambulatory Visit
Admission: RE | Admit: 2024-03-14 | Discharge: 2024-03-14 | Disposition: A | Source: Ambulatory Visit | Attending: Pulmonary Disease | Admitting: Pulmonary Disease

## 2024-03-14 ENCOUNTER — Ambulatory Visit: Admitting: Pulmonary Disease

## 2024-03-14 ENCOUNTER — Ambulatory Visit: Payer: Self-pay | Admitting: Pulmonary Disease

## 2024-03-14 ENCOUNTER — Encounter: Payer: Self-pay | Admitting: Pulmonary Disease

## 2024-03-14 VITALS — BP 126/60 | HR 78 | Temp 97.9°F | Ht 71.0 in | Wt 195.0 lb

## 2024-03-14 DIAGNOSIS — R0602 Shortness of breath: Secondary | ICD-10-CM

## 2024-03-14 DIAGNOSIS — J3489 Other specified disorders of nose and nasal sinuses: Secondary | ICD-10-CM

## 2024-03-14 DIAGNOSIS — J45998 Other asthma: Secondary | ICD-10-CM

## 2024-03-14 DIAGNOSIS — Z87891 Personal history of nicotine dependence: Secondary | ICD-10-CM | POA: Diagnosis not present

## 2024-03-14 DIAGNOSIS — R918 Other nonspecific abnormal finding of lung field: Secondary | ICD-10-CM

## 2024-03-14 DIAGNOSIS — R5383 Other fatigue: Secondary | ICD-10-CM | POA: Diagnosis not present

## 2024-03-14 DIAGNOSIS — R490 Dysphonia: Secondary | ICD-10-CM | POA: Diagnosis not present

## 2024-03-14 DIAGNOSIS — L209 Atopic dermatitis, unspecified: Secondary | ICD-10-CM | POA: Diagnosis not present

## 2024-03-14 DIAGNOSIS — J4540 Moderate persistent asthma, uncomplicated: Secondary | ICD-10-CM

## 2024-03-14 DIAGNOSIS — J329 Chronic sinusitis, unspecified: Secondary | ICD-10-CM

## 2024-03-14 MED ORDER — BUDESONIDE-FORMOTEROL FUMARATE 160-4.5 MCG/ACT IN AERO: 2.0000 | INHALATION_SPRAY | Freq: Two times a day (BID) | RESPIRATORY_TRACT | 11 refills | Status: AC

## 2024-03-14 NOTE — Patient Instructions (Addendum)
 VISIT SUMMARY:  During your visit, we discussed your persistent asthma symptoms, including breathlessness, lack of energy, and voice changes. We also addressed your financial constraints regarding your current inhaler and explored alternative options. Additionally, we reviewed your nasal obstruction and dysphonia, as well as your history of pulmonary fibrosis.  YOUR PLAN:  -PERSISTENT ASTHMA: Persistent asthma is a long-term condition where your airways are inflamed and narrowed, causing difficulty in breathing. We have switched your inhaler to a more affordable option and prescribed a new inhaler to be used twice daily. Please remember to rinse your mouth well after using the inhaler. We have also ordered a chest x-ray to rule out other causes of your symptoms and scheduled a chest CT to assess for lung scarring.  -NASAL OBSTRUCTION AND DYSPHONIA: Nasal obstruction and dysphonia refer to difficulty breathing through your nose and changes in your voice. These symptoms may be related to your asthma or other causes. We have referred you to an ear, nose, and throat specialist for further evaluation.  - POSSIBLE LUNG SCARRING: Possible lung scarring is a condition where lung tissue becomes damaged and scarred, making it difficult to breathe.  We have ordered a chest CT to assess for any potential lung scarring.  INSTRUCTIONS:  Please follow up with the ear, nose, and throat specialist as referred. Additionally, complete the chest x-ray and chest CT as ordered. Continue using the new inhaler as prescribed and remember to rinse your mouth after each use.

## 2024-03-17 ENCOUNTER — Encounter: Payer: Self-pay | Admitting: Otolaryngology

## 2024-04-27 ENCOUNTER — Ambulatory Visit: Admitting: Pulmonary Disease

## 2024-05-11 ENCOUNTER — Ambulatory Visit: Admitting: Pulmonary Disease

## 2024-05-24 ENCOUNTER — Ambulatory Visit
